# Patient Record
Sex: Male | Born: 1945
Health system: Southern US, Community
[De-identification: ages and names within clinical notes are randomized; demographics above are authoritative.]

## PROBLEM LIST (undated history)

## (undated) DIAGNOSIS — K219 Gastro-esophageal reflux disease without esophagitis: Secondary | ICD-10-CM

## (undated) DIAGNOSIS — I10 Essential (primary) hypertension: Secondary | ICD-10-CM

## (undated) DIAGNOSIS — J189 Pneumonia, unspecified organism: Secondary | ICD-10-CM

## (undated) DIAGNOSIS — L0390 Cellulitis, unspecified: Secondary | ICD-10-CM

## (undated) DIAGNOSIS — R011 Cardiac murmur, unspecified: Secondary | ICD-10-CM

## (undated) DIAGNOSIS — E039 Hypothyroidism, unspecified: Secondary | ICD-10-CM

## (undated) DIAGNOSIS — I509 Heart failure, unspecified: Secondary | ICD-10-CM

## (undated) DIAGNOSIS — I639 Cerebral infarction, unspecified: Secondary | ICD-10-CM

## (undated) DIAGNOSIS — Y92009 Unspecified place in unspecified non-institutional (private) residence as the place of occurrence of the external cause: Secondary | ICD-10-CM

## (undated) DIAGNOSIS — E7800 Pure hypercholesterolemia, unspecified: Secondary | ICD-10-CM

## (undated) DIAGNOSIS — G35 Multiple sclerosis: Secondary | ICD-10-CM

## (undated) DIAGNOSIS — W19XXXA Unspecified fall, initial encounter: Secondary | ICD-10-CM

## (undated) HISTORY — PX: APPENDECTOMY: SHX54

## (undated) HISTORY — PX: CARDIOVERSION: SHX1299

---

## 2013-11-12 ENCOUNTER — Emergency Department (HOSPITAL_BASED_OUTPATIENT_CLINIC_OR_DEPARTMENT_OTHER): Payer: Non-veteran care

## 2013-11-12 ENCOUNTER — Encounter (HOSPITAL_BASED_OUTPATIENT_CLINIC_OR_DEPARTMENT_OTHER): Payer: Self-pay | Admitting: Emergency Medicine

## 2013-11-12 ENCOUNTER — Emergency Department (HOSPITAL_BASED_OUTPATIENT_CLINIC_OR_DEPARTMENT_OTHER)
Admission: EM | Admit: 2013-11-12 | Discharge: 2013-11-12 | Disposition: A | Discharge status: Home / Self Care | Payer: Non-veteran care | Attending: Emergency Medicine | Admitting: Emergency Medicine

## 2013-11-12 DIAGNOSIS — R42 Dizziness and giddiness: Secondary | ICD-10-CM | POA: Insufficient documentation

## 2013-11-12 DIAGNOSIS — R011 Cardiac murmur, unspecified: Secondary | ICD-10-CM | POA: Insufficient documentation

## 2013-11-12 DIAGNOSIS — Y9389 Activity, other specified: Secondary | ICD-10-CM | POA: Insufficient documentation

## 2013-11-12 DIAGNOSIS — Z7982 Long term (current) use of aspirin: Secondary | ICD-10-CM | POA: Insufficient documentation

## 2013-11-12 DIAGNOSIS — Z043 Encounter for examination and observation following other accident: Secondary | ICD-10-CM | POA: Diagnosis not present

## 2013-11-12 DIAGNOSIS — W1809XA Striking against other object with subsequent fall, initial encounter: Secondary | ICD-10-CM | POA: Insufficient documentation

## 2013-11-12 DIAGNOSIS — E7800 Pure hypercholesterolemia, unspecified: Secondary | ICD-10-CM | POA: Diagnosis not present

## 2013-11-12 DIAGNOSIS — Y92009 Unspecified place in unspecified non-institutional (private) residence as the place of occurrence of the external cause: Secondary | ICD-10-CM | POA: Diagnosis not present

## 2013-11-12 DIAGNOSIS — I10 Essential (primary) hypertension: Secondary | ICD-10-CM | POA: Insufficient documentation

## 2013-11-12 DIAGNOSIS — Z79899 Other long term (current) drug therapy: Secondary | ICD-10-CM | POA: Insufficient documentation

## 2013-11-12 DIAGNOSIS — W19XXXA Unspecified fall, initial encounter: Secondary | ICD-10-CM

## 2013-11-12 DIAGNOSIS — K219 Gastro-esophageal reflux disease without esophagitis: Secondary | ICD-10-CM | POA: Diagnosis not present

## 2013-11-12 DIAGNOSIS — Z8669 Personal history of other diseases of the nervous system and sense organs: Secondary | ICD-10-CM | POA: Diagnosis not present

## 2013-11-12 DIAGNOSIS — E039 Hypothyroidism, unspecified: Secondary | ICD-10-CM | POA: Insufficient documentation

## 2013-11-12 HISTORY — DX: Multiple sclerosis: G35

## 2013-11-12 HISTORY — DX: Cardiac murmur, unspecified: R01.1

## 2013-11-12 HISTORY — DX: Pure hypercholesterolemia, unspecified: E78.00

## 2013-11-12 HISTORY — DX: Essential (primary) hypertension: I10

## 2013-11-12 HISTORY — DX: Hypothyroidism, unspecified: E03.9

## 2013-11-12 HISTORY — DX: Gastro-esophageal reflux disease without esophagitis: K21.9

## 2013-11-12 LAB — BASIC METABOLIC PANEL
Anion gap: 9 (ref 5–15)
BUN: 15 mg/dL (ref 6–23)
CHLORIDE: 106 meq/L (ref 96–112)
CO2: 29 meq/L (ref 19–32)
Calcium: 9.4 mg/dL (ref 8.4–10.5)
Creatinine, Ser: 1.3 mg/dL (ref 0.50–1.35)
GFR calc Af Amer: 64 mL/min — ABNORMAL LOW (ref >90)
GFR calc non Af Amer: 55 mL/min — ABNORMAL LOW (ref >90)
Glucose, Bld: 90 mg/dL (ref 70–99)
Potassium: 4.5 mEq/L (ref 3.7–5.3)
SODIUM: 144 meq/L (ref 137–147)

## 2013-11-12 LAB — CBC
HCT: 38.7 % — ABNORMAL LOW (ref 39.0–52.0)
HEMOGLOBIN: 13.1 g/dL (ref 13.0–17.0)
MCH: 31.1 pg (ref 26.0–34.0)
MCHC: 33.9 g/dL (ref 30.0–36.0)
MCV: 91.9 fL (ref 78.0–100.0)
Platelets: 142 10*3/uL — ABNORMAL LOW (ref 150–400)
RBC: 4.21 MIL/uL — AB (ref 4.22–5.81)
RDW: 15.3 % (ref 11.5–15.5)
WBC: 4.6 10*3/uL (ref 4.0–10.5)

## 2013-11-12 NOTE — ED Notes (Signed)
Pt returns to ed from ct scan!

## 2013-11-12 NOTE — Discharge Instructions (Signed)

## 2013-11-12 NOTE — ED Provider Notes (Signed)
CSN: [id number]     Arrival date & time 11/12/13  1719 History   First MD Initiated Contact with Patient 11/12/13 1720     Chief Complaint  Patient presents with  . Fall     (Consider location/radiation/quality/duration/timing/severity/associated sxs/prior Treatment) Patient is a 68 y.o. male presenting with fall. The history is provided by the patient.  Fall This is a new problem. The current episode started less than 1 hour ago. Episode frequency: once. The problem has been resolved. Pertinent negatives include no chest pain, no abdominal pain and no shortness of breath.    Past Medical History  Diagnosis Date  . Hypertension   . GERD (gastroesophageal reflux disease)   . Hypercholesteremia   . Thyroid activity decreased   . Multiple sclerosis   . Murmur, heart    Past Surgical History  Procedure Laterality Date  . Appendectomy     No family history on file. History  Substance Use Topics  . Smoking status: Never Smoker   . Smokeless tobacco: Not on file  . Alcohol Use: No    Review of Systems  Constitutional: Negative for fever and chills.  Respiratory: Negative for cough and shortness of breath.   Cardiovascular: Negative for chest pain.  Gastrointestinal: Negative for vomiting and abdominal pain.  All other systems reviewed and are negative.     Allergies  Review of patient's allergies indicates no known allergies.  Home Medications   Prior to Admission medications   Medication Sig Start Date End Date Taking? Authorizing Provider  amiodarone (PACERONE) 100 MG tablet Take 100 mg by mouth daily.   Yes Historical Provider, MD  aspirin 81 MG tablet Take 81 mg by mouth daily.   Yes Historical Provider, MD  atorvastatin (LIPITOR) 10 MG tablet Take 10 mg by mouth daily.   Yes Historical Provider, MD  hydrochlorothiazide (HYDRODIURIL) 50 MG tablet Take 50 mg by mouth daily.   Yes Historical Provider, MD  levothyroxine (SYNTHROID, LEVOTHROID) 100 MCG tablet Take  100 mcg by mouth daily before breakfast.   Yes Historical Provider, MD  metoprolol tartrate (LOPRESSOR) 25 MG tablet Take 25 mg by mouth 2 (two) times daily.   Yes Historical Provider, MD  pantoprazole (PROTONIX) 20 MG tablet Take 20 mg by mouth daily.   Yes Historical Provider, MD   BP 209/81  Pulse 53  Temp(Src) 98.8 F (37.1 C) (Oral)  Resp 18  Ht 6\' 1"  (1.854 m)  Wt 219 lb (99.338 kg)  BMI 28.90 kg/m2  SpO2 98% Physical Exam  Nursing note and vitals reviewed. Constitutional: He is oriented to person, place, and time. He appears well-developed and well-nourished. No distress.  HENT:  Head: Normocephalic and atraumatic.  Mouth/Throat: Oropharynx is clear and moist. No oropharyngeal exudate.  Eyes: EOM are normal. Pupils are equal, round, and reactive to light.  Neck: Normal range of motion. Neck supple. No tracheal deviation present. No thyromegaly present.  Cardiovascular: Normal rate and regular rhythm.  Exam reveals no friction rub.   No murmur heard. Pulmonary/Chest: Effort normal and breath sounds normal. No respiratory distress. He has no wheezes. He has no rales.  Abdominal: He exhibits no distension. There is no tenderness. There is no rebound.  Musculoskeletal: Normal range of motion. He exhibits no edema.  Neurological: He is alert and oriented to person, place, and time. No cranial nerve deficit. He exhibits normal muscle tone. Coordination normal.  Skin: No rash noted. He is not diaphoretic.    ED Course  Procedures (  including critical care time) Labs Review Labs Reviewed  CBC  BASIC METABOLIC PANEL    Imaging Review Ct Head Wo Contrast  11/12/2013   CLINICAL DATA:  Larey Seat.  Hit head.  EXAM: CT HEAD WITHOUT CONTRAST  TECHNIQUE: Contiguous axial images were obtained from the base of the skull through the vertex without intravenous contrast.  COMPARISON:  10/25/2004.  FINDINGS: Progressive age related cerebral atrophy, ventriculomegaly and periventricular white  matter disease. Remote appearing bilateral basal ganglia infarcts. No extra-axial fluid collections are identified. No CT findings for acute hemispheric infarction or intracranial hemorrhage. No mass lesions. The brainstem and cerebellum are normal.  The bony structures are intact. No acute skull fracture. The paranasal sinuses and mastoid air cells are clear. The globes are intact.  IMPRESSION: Cerebral atrophy, ventriculomegaly, periventricular white matter disease and remote lacunar-type basal ganglia infarcts.  No acute intracranial findings or skull fracture.   Electronically Signed   By: Loralie Champagne M.D.   On: 11/12/2013 18:17     EKG Interpretation None      MDM   Final diagnoses:  Fall, initial encounter    11M here after a fall. Was in his kitchen and fell backwards, denting the wall. No LOC. EMS called to assist with getting up. Patient refused ED transport, but EMS spoke with him and he agreed to come. Denies any preceding CP, SOB, dizziness. No recent bloody stools, cough, SOB, illness. Here with bradycardia, hypertension. No trauma noted. Normal neurologic exam. No extremity deformity. Will scan his head and check basic labs.  Scan labs are normal. Patient stable for discharge. He states he just lost balance and does this from time to time. No signs concerning for stroke. Has friends for transfer back home. Will follow up with his PCP soon.  Elwin Mocha, MD 11/12/13 [phone]

## 2013-11-12 NOTE — ED Notes (Signed)
Pt lives in independent living facility, fell in his kitchen. Hit his head on the wall, hypertensive (190/110), c/o lightheadedness.

## 2013-11-12 NOTE — ED Notes (Signed)
Nettie Elm, RN was unable to obtain labs with iv access. Pt is in ct scan, will attempt phlebotomy when pt returns to ed.

## 2014-04-02 DIAGNOSIS — J189 Pneumonia, unspecified organism: Secondary | ICD-10-CM

## 2014-04-02 HISTORY — DX: Pneumonia, unspecified organism: J18.9

## 2014-04-02 HISTORY — PX: ARTERIAL THROMBECTOMY: SHX558

## 2014-09-30 ENCOUNTER — Encounter (HOSPITAL_BASED_OUTPATIENT_CLINIC_OR_DEPARTMENT_OTHER): Payer: Self-pay | Admitting: *Deleted

## 2014-09-30 ENCOUNTER — Inpatient Hospital Stay (HOSPITAL_BASED_OUTPATIENT_CLINIC_OR_DEPARTMENT_OTHER)
Admission: EM | Admit: 2014-09-30 | Discharge: 2014-10-05 | DRG: 372 | Disposition: A | Discharge status: Rehabilitation Facility | Payer: Medicare Other | Attending: Internal Medicine | Admitting: Internal Medicine

## 2014-09-30 DIAGNOSIS — E7800 Pure hypercholesterolemia, unspecified: Secondary | ICD-10-CM

## 2014-09-30 DIAGNOSIS — S3289XA Fracture of other parts of pelvis, initial encounter for closed fracture: Secondary | ICD-10-CM | POA: Diagnosis present

## 2014-09-30 DIAGNOSIS — W19XXXA Unspecified fall, initial encounter: Secondary | ICD-10-CM | POA: Diagnosis not present

## 2014-09-30 DIAGNOSIS — S329XXA Fracture of unspecified parts of lumbosacral spine and pelvis, initial encounter for closed fracture: Secondary | ICD-10-CM

## 2014-09-30 DIAGNOSIS — A047 Enterocolitis due to Clostridium difficile: Principal | ICD-10-CM | POA: Diagnosis present

## 2014-09-30 DIAGNOSIS — D696 Thrombocytopenia, unspecified: Secondary | ICD-10-CM | POA: Diagnosis present

## 2014-09-30 DIAGNOSIS — E785 Hyperlipidemia, unspecified: Secondary | ICD-10-CM | POA: Diagnosis present

## 2014-09-30 DIAGNOSIS — I9589 Other hypotension: Secondary | ICD-10-CM | POA: Diagnosis not present

## 2014-09-30 DIAGNOSIS — N179 Acute kidney failure, unspecified: Secondary | ICD-10-CM | POA: Diagnosis present

## 2014-09-30 DIAGNOSIS — A0472 Enterocolitis due to Clostridium difficile, not specified as recurrent: Secondary | ICD-10-CM

## 2014-09-30 DIAGNOSIS — R5381 Other malaise: Secondary | ICD-10-CM | POA: Diagnosis not present

## 2014-09-30 DIAGNOSIS — N183 Chronic kidney disease, stage 3 (moderate): Secondary | ICD-10-CM | POA: Diagnosis present

## 2014-09-30 DIAGNOSIS — L8990 Pressure ulcer of unspecified site, unspecified stage: Secondary | ICD-10-CM | POA: Insufficient documentation

## 2014-09-30 DIAGNOSIS — S329XXS Fracture of unspecified parts of lumbosacral spine and pelvis, sequela: Secondary | ICD-10-CM | POA: Diagnosis not present

## 2014-09-30 DIAGNOSIS — I129 Hypertensive chronic kidney disease with stage 1 through stage 4 chronic kidney disease, or unspecified chronic kidney disease: Secondary | ICD-10-CM | POA: Diagnosis present

## 2014-09-30 DIAGNOSIS — G35 Multiple sclerosis: Secondary | ICD-10-CM

## 2014-09-30 DIAGNOSIS — Z8669 Personal history of other diseases of the nervous system and sense organs: Secondary | ICD-10-CM | POA: Diagnosis not present

## 2014-09-30 DIAGNOSIS — E039 Hypothyroidism, unspecified: Secondary | ICD-10-CM | POA: Diagnosis present

## 2014-09-30 DIAGNOSIS — R197 Diarrhea, unspecified: Secondary | ICD-10-CM | POA: Diagnosis not present

## 2014-09-30 DIAGNOSIS — D649 Anemia, unspecified: Secondary | ICD-10-CM | POA: Diagnosis present

## 2014-09-30 DIAGNOSIS — W010XXA Fall on same level from slipping, tripping and stumbling without subsequent striking against object, initial encounter: Secondary | ICD-10-CM | POA: Diagnosis present

## 2014-09-30 DIAGNOSIS — I482 Chronic atrial fibrillation: Secondary | ICD-10-CM | POA: Diagnosis present

## 2014-09-30 DIAGNOSIS — S32591S Other specified fracture of right pubis, sequela: Secondary | ICD-10-CM | POA: Diagnosis not present

## 2014-09-30 DIAGNOSIS — E038 Other specified hypothyroidism: Secondary | ICD-10-CM | POA: Diagnosis not present

## 2014-09-30 DIAGNOSIS — I959 Hypotension, unspecified: Secondary | ICD-10-CM | POA: Diagnosis present

## 2014-09-30 DIAGNOSIS — R531 Weakness: Secondary | ICD-10-CM | POA: Diagnosis not present

## 2014-09-30 DIAGNOSIS — L89622 Pressure ulcer of left heel, stage 2: Secondary | ICD-10-CM | POA: Diagnosis present

## 2014-09-30 DIAGNOSIS — E860 Dehydration: Secondary | ICD-10-CM | POA: Diagnosis not present

## 2014-09-30 DIAGNOSIS — K219 Gastro-esophageal reflux disease without esophagitis: Secondary | ICD-10-CM | POA: Diagnosis present

## 2014-09-30 DIAGNOSIS — Z7982 Long term (current) use of aspirin: Secondary | ICD-10-CM | POA: Diagnosis not present

## 2014-09-30 DIAGNOSIS — I872 Venous insufficiency (chronic) (peripheral): Secondary | ICD-10-CM | POA: Diagnosis present

## 2014-09-30 DIAGNOSIS — Z79899 Other long term (current) drug therapy: Secondary | ICD-10-CM | POA: Diagnosis not present

## 2014-09-30 DIAGNOSIS — G8250 Quadriplegia, unspecified: Secondary | ICD-10-CM | POA: Diagnosis not present

## 2014-09-30 LAB — URINALYSIS, ROUTINE W REFLEX MICROSCOPIC
Bilirubin Urine: NEGATIVE
Glucose, UA: NEGATIVE mg/dL
Hgb urine dipstick: NEGATIVE
KETONES UR: NEGATIVE mg/dL
LEUKOCYTES UA: NEGATIVE
NITRITE: NEGATIVE
PROTEIN: NEGATIVE mg/dL
Specific Gravity, Urine: 1.013 (ref 1.005–1.030)
Urobilinogen, UA: 0.2 mg/dL (ref 0.0–1.0)
pH: 5.5 (ref 5.0–8.0)

## 2014-09-30 LAB — COMPREHENSIVE METABOLIC PANEL
ALT: 17 U/L (ref 17–63)
AST: 26 U/L (ref 15–41)
Albumin: 2.9 g/dL — ABNORMAL LOW (ref 3.5–5.0)
Alkaline Phosphatase: 52 U/L (ref 38–126)
Anion gap: 8 (ref 5–15)
BILIRUBIN TOTAL: 1.7 mg/dL — AB (ref 0.3–1.2)
BUN: 33 mg/dL — AB (ref 6–20)
CHLORIDE: 105 mmol/L (ref 101–111)
CO2: 26 mmol/L (ref 22–32)
CREATININE: 1.58 mg/dL — AB (ref 0.61–1.24)
Calcium: 8 mg/dL — ABNORMAL LOW (ref 8.9–10.3)
GFR calc Af Amer: 50 mL/min — ABNORMAL LOW (ref >60)
GFR, EST NON AFRICAN AMERICAN: 43 mL/min — AB (ref >60)
GLUCOSE: 76 mg/dL (ref 65–99)
POTASSIUM: 3.6 mmol/L (ref 3.5–5.1)
Sodium: 139 mmol/L (ref 135–145)
Total Protein: 6 g/dL — ABNORMAL LOW (ref 6.5–8.1)

## 2014-09-30 LAB — I-STAT CG4 LACTIC ACID, ED: Lactic Acid, Venous: 1.99 mmol/L (ref 0.5–2.0)

## 2014-09-30 LAB — CBC WITH DIFFERENTIAL/PLATELET
BASOS ABS: 0.1 10*3/uL (ref 0.0–0.1)
Basophils Relative: 1 % (ref 0–1)
Eosinophils Absolute: 0.3 10*3/uL (ref 0.0–0.7)
Eosinophils Relative: 2 % (ref 0–5)
HCT: 38.9 % — ABNORMAL LOW (ref 39.0–52.0)
HEMOGLOBIN: 12.6 g/dL — AB (ref 13.0–17.0)
LYMPHS PCT: 7 % — AB (ref 12–46)
Lymphs Abs: 0.9 10*3/uL (ref 0.7–4.0)
MCH: 30.4 pg (ref 26.0–34.0)
MCHC: 32.4 g/dL (ref 30.0–36.0)
MCV: 93.7 fL (ref 78.0–100.0)
MONO ABS: 1.3 10*3/uL — AB (ref 0.1–1.0)
MONOS PCT: 10 % (ref 3–12)
NEUTROS PCT: 80 % — AB (ref 43–77)
Neutro Abs: 10.8 10*3/uL — ABNORMAL HIGH (ref 1.7–7.7)
PLATELETS: 213 10*3/uL (ref 150–400)
RBC: 4.15 MIL/uL — ABNORMAL LOW (ref 4.22–5.81)
RDW: 13.3 % (ref 11.5–15.5)
WBC: 13.3 10*3/uL — ABNORMAL HIGH (ref 4.0–10.5)

## 2014-09-30 MED ORDER — ONDANSETRON HCL 4 MG PO TABS: 4.0000 mg | ORAL_TABLET | Freq: Four times a day (QID) | ORAL | Status: DC | PRN

## 2014-09-30 MED ORDER — ACETAMINOPHEN 650 MG RE SUPP: 650.0000 mg | Freq: Four times a day (QID) | RECTAL | Status: DC | PRN

## 2014-09-30 MED ORDER — TRAMADOL HCL 50 MG PO TABS
50.0000 mg | ORAL_TABLET | Freq: Four times a day (QID) | ORAL | Status: DC | PRN
  Administered 2014-09-30 – 2014-10-04 (×7): 50 mg via ORAL
  Filled 2014-09-30 (×7): qty 1

## 2014-09-30 MED ORDER — LEVOTHYROXINE SODIUM 25 MCG PO TABS
125.0000 ug | ORAL_TABLET | Freq: Every day | ORAL | Status: DC
  Administered 2014-10-01 – 2014-10-05 (×5): 125 ug via ORAL
  Filled 2014-09-30 (×10): qty 1

## 2014-09-30 MED ORDER — SODIUM CHLORIDE 0.9 % IV BOLUS (SEPSIS): 1000.0000 mL | Freq: Once | INTRAVENOUS | Status: DC

## 2014-09-30 MED ORDER — SODIUM CHLORIDE 0.9 % IV SOLN
INTRAVENOUS | Status: AC
  Administered 2014-10-01 (×3): via INTRAVENOUS

## 2014-09-30 MED ORDER — ASPIRIN 81 MG PO CHEW
81.0000 mg | CHEWABLE_TABLET | Freq: Every day | ORAL | Status: DC
  Administered 2014-10-01 – 2014-10-05 (×5): 81 mg via ORAL
  Filled 2014-09-30 (×5): qty 1

## 2014-09-30 MED ORDER — ATORVASTATIN CALCIUM 80 MG PO TABS
80.0000 mg | ORAL_TABLET | Freq: Every day | ORAL | Status: DC
  Administered 2014-10-01 – 2014-10-05 (×5): 80 mg via ORAL
  Filled 2014-09-30 (×5): qty 1

## 2014-09-30 MED ORDER — DIMETHYL FUMARATE 240 MG PO CPDR
240.0000 mg | DELAYED_RELEASE_CAPSULE | Freq: Two times a day (BID) | ORAL | Status: DC
  Administered 2014-10-01 – 2014-10-05 (×6): 240 mg via ORAL
  Filled 2014-09-30 (×3): qty 1

## 2014-09-30 MED ORDER — ONDANSETRON HCL 4 MG/2ML IJ SOLN: 4.0000 mg | Freq: Four times a day (QID) | INTRAMUSCULAR | Status: DC | PRN

## 2014-09-30 MED ORDER — SODIUM CHLORIDE 0.9 % IV BOLUS (SEPSIS)
1000.0000 mL | Freq: Once | INTRAVENOUS | Status: AC
  Administered 2014-09-30: 1000 mL via INTRAVENOUS

## 2014-09-30 MED ORDER — SODIUM CHLORIDE 0.9 % IV SOLN
Freq: Once | INTRAVENOUS | Status: AC
  Administered 2014-09-30: 14:00:00 via INTRAVENOUS

## 2014-09-30 MED ORDER — ENSURE ENLIVE PO LIQD
237.0000 mL | Freq: Two times a day (BID) | ORAL | Status: DC
  Administered 2014-10-01 – 2014-10-05 (×10): 237 mL via ORAL

## 2014-09-30 MED ORDER — GABAPENTIN 300 MG PO CAPS
300.0000 mg | ORAL_CAPSULE | Freq: Three times a day (TID) | ORAL | Status: DC
  Administered 2014-09-30 – 2014-10-05 (×15): 300 mg via ORAL
  Filled 2014-09-30 (×15): qty 1

## 2014-09-30 MED ORDER — AMIODARONE HCL 200 MG PO TABS
200.0000 mg | ORAL_TABLET | Freq: Every day | ORAL | Status: DC
  Administered 2014-10-01 – 2014-10-05 (×5): 200 mg via ORAL
  Filled 2014-09-30 (×5): qty 1

## 2014-09-30 MED ORDER — METRONIDAZOLE IN NACL 5-0.79 MG/ML-% IV SOLN
500.0000 mg | Freq: Three times a day (TID) | INTRAVENOUS | Status: DC
  Administered 2014-10-01 (×2): 500 mg via INTRAVENOUS
  Filled 2014-09-30 (×3): qty 100

## 2014-09-30 MED ORDER — ENOXAPARIN SODIUM 40 MG/0.4ML ~~LOC~~ SOLN
40.0000 mg | SUBCUTANEOUS | Status: DC
  Administered 2014-10-01 – 2014-10-05 (×5): 40 mg via SUBCUTANEOUS
  Filled 2014-09-30 (×5): qty 0.4

## 2014-09-30 MED ORDER — IPRATROPIUM BROMIDE 0.06 % NA SOLN
2.0000 | Freq: Four times a day (QID) | NASAL | Status: DC
  Administered 2014-10-01 – 2014-10-05 (×13): 2 via NASAL
  Filled 2014-09-30: qty 15

## 2014-09-30 MED ORDER — ACETAMINOPHEN 325 MG PO TABS: 650.0000 mg | ORAL_TABLET | Freq: Four times a day (QID) | ORAL | Status: DC | PRN

## 2014-09-30 NOTE — ED Notes (Addendum)
[facility] called [facility] back-[facility] states they received the paperwork and will give to ER doctor but explained to tech that it could take a while due to being busy.  PA made aware.  Tech to call back in 

## 2014-09-30 NOTE — ED Notes (Signed)
Assisted patient to bedside commode.  Patient states he felt like he needed to have a bowel movement but was unable to go after being placed on bedside commode

## 2014-09-30 NOTE — ED Notes (Signed)
Delay in care-[facility] called and stated they did not have paperwork that was faxed over earlier today. Copy of original fax transmission "OK" @ 1811.  Requesting information to be refaxed.

## 2014-09-30 NOTE — ED Notes (Signed)
Patients sister is Conchita Paris # is [phone]

## 2014-09-30 NOTE — ED Notes (Addendum)
Old dried stool cleansed from pt's buttocks, scrotum and inner thighs. Scrotum noted red and excoriated- stage 2 ulcers to inner buttocks noted- dressings removed from BLE- 3+ edema noted with redness and scattered blisters and open wounds

## 2014-09-30 NOTE — H&P (Signed)
[facility] History and Physical  Daytin Maddy DGL:[id number] DOB: November 12, 1945 DOA: 09/30/2014  Referring physician: Mr.Hess.  PCP: [facility] Specialists: None.  Chief Complaint: Fall and low blood pressure.  HPI: Martin Powell is a 69 y.o. male with history of hypertension, hyperlipidemia, atrial fibrillation, multiple sclerosis with right lower extremity weakness and chronic lower extremity wounds who was recently in the hospital at [facility] for lower extremity wounds and was on clindamycin last month had a fall at his house and EMS was called. Patient states he was walking with help of the walker when suddenly tripped and fell last night and he had to crawl to the phone which became a lot of effort. When the EMS came he was found to be hypotensive and was given 500 mL normal saline bolus as patient's blood pressure was found in the 70s systolic. Patient was brought to the ER and was given further fluid boluses as patient's blood pressure was still in the low 90s. Patient states over the last 2 days patient has developed multiple episodes of diarrhea. Denies any nausea vomiting or abdominal pain. Patient states he also has pain in the right lower extremity around the hip and thigh area since his fall. Patient has weakness of the right lower extremity from his known history of multiple sclerosis. On exam patient has pain in his right lower extremity on trying to flex his thigh. Patient otherwise denies any chest pain or shortness of breath. Patient has bilateral lower extremity wound dressing done.   Review of Systems: As presented in the history of presenting illness, rest negative.  Past Medical History  Diagnosis Date  . Hypertension   . GERD (gastroesophageal reflux disease)   . Hypercholesteremia   . Thyroid activity decreased   . Multiple sclerosis   . Murmur, heart    Past Surgical History  Procedure Laterality Date  . Appendectomy     Social  History:  reports that he has never smoked. He does not have any smokeless tobacco history on file. He reports that he does not drink alcohol or use illicit drugs. Where does patient live home. Can patient participate in ADLs? Yes.  No Known Allergies  Family History:  Family History  Problem Relation Age of Onset  . CAD Neg Hx       Prior to Admission medications   Medication Sig Start Date End Date Taking? Authorizing Provider  amiodarone (PACERONE) 200 MG tablet Take 200 mg by mouth daily.   Yes Historical Provider, MD  aspirin 81 MG tablet Take 81 mg by mouth daily.   Yes Historical Provider, MD  atorvastatin (LIPITOR) 80 MG tablet Take 80 mg by mouth daily.   Yes Historical Provider, MD  Cholecalciferol (VITAMIN D3) 2000 UNITS TABS Take 1 tablet by mouth daily.   Yes Historical Provider, MD  Dimethyl Fumarate (TECFIDERA) 240 MG CPDR Take by mouth 2 (two) times daily.   Yes Historical Provider, MD  gabapentin (NEURONTIN) 300 MG capsule Take 300 mg by mouth 3 (three) times daily.   Yes Historical Provider, MD  ipratropium (ATROVENT) 0.06 % nasal spray Place 2 sprays into both nostrils 4 (four) times daily.   Yes Historical Provider, MD  levothyroxine (SYNTHROID, LEVOTHROID) 125 MCG tablet Take 125 mcg by mouth daily before breakfast.   Yes Historical Provider, MD  lisinopril-hydrochlorothiazide (PRINZIDE,ZESTORETIC) 20-12.5 MG per tablet Take 1 tablet by mouth daily.   Yes Historical Provider, MD  metoprolol (LOPRESSOR) 50 MG tablet  Take 50 mg by mouth 2 (two) times daily.   Yes Historical Provider, MD  amiodarone (PACERONE) 100 MG tablet Take 100 mg by mouth daily.    Historical Provider, MD  atorvastatin (LIPITOR) 10 MG tablet Take 10 mg by mouth daily.    Historical Provider, MD  hydrochlorothiazide (HYDRODIURIL) 50 MG tablet Take 50 mg by mouth daily.    Historical Provider, MD  levothyroxine (SYNTHROID, LEVOTHROID) 100 MCG tablet Take 100 mcg by mouth daily before breakfast.     Historical Provider, MD  metoprolol tartrate (LOPRESSOR) 25 MG tablet Take 25 mg by mouth 2 (two) times daily.    Historical Provider, MD  pantoprazole (PROTONIX) 20 MG tablet Take 20 mg by mouth daily.    Historical Provider, MD    Physical Exam: Filed Vitals:   09/30/14 2030 09/30/14 2045 09/30/14 2236 09/30/14 2244  BP: 105/65 118/78  113/76  Pulse: 90 105    Temp:    97.6 F (36.4 C)  TempSrc:    Oral  Resp: Height:    (1.905 m)   Weight:   109.498 kg (241 lb 6.4 oz)   SpO2: 99% 100%       General:  Moderately built and nourished.  Eyes: Anicteric no pallor.  ENT: No discharge from the ears eyes nose or mouth.  Neck: No mass felt.  Cardiovascular: S1-S2 heard.  Respiratory: No rhonchi or crepitations.  Abdomen: Soft nontender bowel sounds present. No guarding or rigidity.  Skin: Has mild skin denudation around the sacral area from repeated diarrhea as per the nurse. Has chronic lower extremity wounds which are dressed.  Musculoskeletal: Patient has pain on moving his right hip.  Psychiatric: Appears normal.  Neurologic: Alert awake oriented to time place and person. Has weakness of the right lower extremity which patient states is chronic from multiple sclerosis.  Labs on Admission:  Basic Metabolic Panel:  Recent Labs Lab 09/30/14 1320  NA 139  K 3.6  CL 105  CO2 26  GLUCOSE 76  BUN 33*  CREATININE 1.58*  CALCIUM 8.0*   Liver Function Tests:  Recent Labs Lab 09/30/14 1320  AST 26  ALT 17  ALKPHOS 52  BILITOT 1.7*  PROT 6.0*  ALBUMIN 2.9*   No results for input(s): LIPASE, AMYLASE in the last 168 hours. No results for input(s): AMMONIA in the last 168 hours. CBC:  Recent Labs Lab 09/30/14 1320  WBC 13.3*  NEUTROABS 10.8*  HGB 12.6*  HCT 38.9*  MCV 93.7  PLT 213   Cardiac Enzymes: No results for input(s): CKTOTAL, CKMB, CKMBINDEX, TROPONINI in the last 168 hours.  BNP (last 3 results) No results for input(s):  BNP in the last 8760 hours.  ProBNP (last 3 results) No results for input(s): PROBNP in the last 8760 hours.  CBG: No results for input(s): GLUCAP in the last 168 hours.  Radiological Exams on Admission: No results found.  EKG: Independently reviewed. Atrial fibrillation with rate around 104 bpm.  Assessment/Plan Principal Problem:   Dehydration Active Problems:   Hypotension   Diarrhea   Fall   Hypothyroidism   History of multiple sclerosis   Hyperlipidemia   1. Dehydration from diarrhea with hypotension - hold antihypertensives and continue with aggressive IV fluid hydration. Patient dehydration probably from the diarrhea for which we are ordering C. difficile and check for other pathogens. For now I have placed patient empirically on Flagyl. Patient denies any abdominal pain or blood in  the stools. Closely follow metabolic panel.  2. Fall with right hip pain - check x-ray of the pelvis for any fracture. Patient also has considerable pain in the right hip for which I have ordered CK levels also. Check CT head since patient states when he fell he hit his head but did not lose consciousness. Closely observe in telemetry. 3. Hypotension with history of hypertension - hold off antihypertensives and see #1. Patient does not look septic at this time. 4. History of atrial fibrillation - patient is not on any antivirals except for aspirin. Not sure why patient is not on any anticoagulation but patient is on amiodarone and metoprolol. Continue amiodarone for now but holding her metoprolol due to low blood pressure. Patient's chads 2 vasc score is 2 and patient will benefit from anticoagulation but we need to check patient's [facility] why patient was not started on anticoagulation. 5. History of multiple sclerosis with right lower extremity weakness - continue present medications. 6. Hypothyroidism on Synthroid. 7. Hyperlipidemia on statins - if CK levels are elevated may have to hold  statins. 8. Chronic kidney disease stage III - creatinine appears to be at baseline. 9. Lower extremity wounds and sacral wound - wound team consult requested. 10. Chronic anemia - follow CBC. 11. Protein calorie malnutrition - will need nutrition consult.   DVT Prophylaxis Lovenox.  Code Status: Full code.  Family Communication: Discussed with patient.  Disposition Plan: Admit to inpatient.    Martin Powell N. [facility] Pager [phone].  If 7PM-7AM, please contact night-coverage [email or web address] Password [email or web address] 09/30/2014, 11:21 PM

## 2014-09-30 NOTE — ED Provider Notes (Signed)
CSN: [id number]     Arrival date & time 09/30/14  1302 History   First MD Initiated Contact with Patient 09/30/14 1336     Chief Complaint  Patient presents with  . Hypotension  . Diarrhea     (Consider location/radiation/quality/duration/timing/severity/associated sxs/prior Treatment) HPI Comments: 69 year old male presenting be EMS with diarrhea 1 week. EMS was called to the home earlier this morning after patient lost his balance and fell, however was not transported at the time. No head injury or LOC. When home health was visiting today, they called EMS due to hypotension. On EMS arrival, blood pressure 76/48. He was given 500 mL bolus. One month ago, patient was admitted to the [facility] for infected wounds on his lower legs and was on clindamycin. Did not start having diarrhea until 1 week ago. Initially, diarrhea was stool colored and is now becoming watery. Denies abdominal pain, fever, chills, nausea or vomiting. Denies bloody stool. Denies chest pain or shortness of breath.  Patient is a 69 y.o. male presenting with diarrhea. The history is provided by the patient and the EMS personnel.  Diarrhea   Past Medical History  Diagnosis Date  . Hypertension   . GERD (gastroesophageal reflux disease)   . Hypercholesteremia   . Thyroid activity decreased   . Multiple sclerosis   . Murmur, heart    Past Surgical History  Procedure Laterality Date  . Appendectomy     No family history on file. History  Substance Use Topics  . Smoking status: Never Smoker   . Smokeless tobacco: Not on file  . Alcohol Use: No    Review of Systems  Gastrointestinal: Positive for diarrhea.  Skin: Positive for wound.  All other systems reviewed and are negative.     Allergies  Review of patient's allergies indicates no known allergies.  Home Medications   Prior to Admission medications   Medication Sig Start Date End Date Taking? Authorizing Provider  amiodarone (PACERONE) 200 MG  tablet Take 200 mg by mouth daily.   Yes Historical Provider, MD  aspirin 81 MG tablet Take 81 mg by mouth daily.   Yes Historical Provider, MD  atorvastatin (LIPITOR) 80 MG tablet Take 80 mg by mouth daily.   Yes Historical Provider, MD  Cholecalciferol (VITAMIN D3) 2000 UNITS TABS Take 1 tablet by mouth daily.   Yes Historical Provider, MD  Dimethyl Fumarate (TECFIDERA) 240 MG CPDR Take by mouth 2 (two) times daily.   Yes Historical Provider, MD  gabapentin (NEURONTIN) 300 MG capsule Take 300 mg by mouth 3 (three) times daily.   Yes Historical Provider, MD  ipratropium (ATROVENT) 0.06 % nasal spray Place 2 sprays into both nostrils 4 (four) times daily.   Yes Historical Provider, MD  levothyroxine (SYNTHROID, LEVOTHROID) 125 MCG tablet Take 125 mcg by mouth daily before breakfast.   Yes Historical Provider, MD  lisinopril-hydrochlorothiazide (PRINZIDE,ZESTORETIC) 20-12.5 MG per tablet Take 1 tablet by mouth daily.   Yes Historical Provider, MD  metoprolol (LOPRESSOR) 50 MG tablet Take 50 mg by mouth 2 (two) times daily.   Yes Historical Provider, MD  amiodarone (PACERONE) 100 MG tablet Take 100 mg by mouth daily.    Historical Provider, MD  atorvastatin (LIPITOR) 10 MG tablet Take 10 mg by mouth daily.    Historical Provider, MD  hydrochlorothiazide (HYDRODIURIL) 50 MG tablet Take 50 mg by mouth daily.    Historical Provider, MD  levothyroxine (SYNTHROID, LEVOTHROID) 100 MCG tablet Take 100 mcg by mouth daily before  breakfast.    Historical Provider, MD  metoprolol tartrate (LOPRESSOR) 25 MG tablet Take 25 mg by mouth 2 (two) times daily.    Historical Provider, MD  pantoprazole (PROTONIX) 20 MG tablet Take 20 mg by mouth daily.    Historical Provider, MD   BP 105/65 mmHg  Pulse 90  Temp(Src) 99.1 F (37.3 C) (Rectal)  Resp 18  Ht  (1.905 m)  Wt 170 lb (77.111 kg)  BMI 21.25 kg/m2  SpO2 99% Physical Exam  Constitutional: He is oriented to person, place, and time. He appears  well-developed and well-nourished. He appears ill. No distress.  HENT:  Head: Normocephalic and atraumatic.  Dry MM.  Eyes: Conjunctivae and EOM are normal.  Neck: Normal range of motion. Neck supple.  Cardiovascular: Regular rhythm and normal heart sounds.  Tachycardia present.   Pulses:      Dorsalis pedis pulses are 2+ on the right side, and 2+ on the left side.  Pulmonary/Chest: Effort normal and breath sounds normal.  Abdominal: He exhibits no distension. Bowel sounds are increased. There is no tenderness.  Musculoskeletal: Normal range of motion. He exhibits no edema.  Neurological: He is alert and oriented to person, place, and time.  Skin: Skin is warm and dry.  Bilateral lower extremities with +1 pitting edema, dry skin and erythema about midway up lower legs. Blistering to right lower extremity medial malleolus. Few chronic appearing stasis ulcers.  Psychiatric: He has a normal mood and affect. His behavior is normal.  Nursing note and vitals reviewed.   ED Course  Procedures (including critical care time) Labs Review Labs Reviewed  COMPREHENSIVE METABOLIC PANEL - Abnormal; Notable for the following:    BUN 33 (*)    Creatinine, Ser 1.58 (*)    Calcium 8.0 (*)    Total Protein 6.0 (*)    Albumin 2.9 (*)    Total Bilirubin 1.7 (*)    GFR calc non Af Amer 43 (*)    GFR calc Af Amer 50 (*)    All other components within normal limits  CBC WITH DIFFERENTIAL/PLATELET - Abnormal; Notable for the following:    WBC 13.3 (*)    RBC 4.15 (*)    Hemoglobin 12.6 (*)    HCT 38.9 (*)    Neutrophils Relative % 80 (*)    Neutro Abs 10.8 (*)    Lymphocytes Relative 7 (*)    Monocytes Absolute 1.3 (*)    All other components within normal limits  CULTURE, BLOOD (ROUTINE X 2)  CULTURE, BLOOD (ROUTINE X 2)  URINE CULTURE  CLOSTRIDIUM DIFFICILE BY PCR (NOT AT [facility])  URINALYSIS, ROUTINE W REFLEX MICROSCOPIC (NOT AT [facility])  I-STAT CG4 LACTIC ACID, ED  I-STAT CG4 LACTIC ACID, ED     Imaging Review No results found.   EKG Interpretation   Date/Time:  Thursday September 30 2014 13:16:47 EDT Ventricular Rate:  103 PR Interval:    QRS Duration: 160 QT Interval:  388 QTC Calculation: 508 R Axis:   -45 Text Interpretation:  Atrial fibrillation with rapid ventricular response  Right bundle branch block Left anterior fascicular block ** Bifascicular  block ** Lateral infarct , age undetermined Cannot rule out Inferior  infarct , age undetermined Abnormal ECG changed from prior EKG Confirmed  by BELFI  MD, MELANIE ([id number]) on 09/30/2014 1:57:33 PM      MDM   Final diagnoses:  Other specified hypotension  Diarrhea  Dehydration   Patient presenting with hypotension, diarrhea for  1 week. He appears ill and dry on arrival. Blood pressure 90/51. Afebrile rectally. Recently admitted to [facility] as stated above, on clindamycin. His wounds on his leg appear chronic. Hypotension most likely from the frequent diarrhea. Patient unable to give a stool sample here in the ED. Concern for C. Difficile. No associated abdominal pain. Abdomen is soft and nontender. Labs significant for leukocytosis of 13.3, slight elevation of creatinine from baseline. When patient arrived, there was initial thought of sepsis given hypotension and tachycardia and workup was initiated. Antibiotics held given concern for C. difficile, and no definite source of infection with patient being afebrile. After receiving IV fluids, blood pressure normalized and patient remains hemodynamically stable. Spoke with Dr. Rito Ehrlich, [facility] to be filled out for the physician to look at. After paperwork was filled out and faxed, a few hours later, [facility] called back stating [facility] have no available [facility] and the patient will need to be admitted elsewhere. I again called [facility] and spoke with Dr. Sharyon Medicus who accepts the patient for admission.  Stable for transfer. Tele bed, inpatient.  Discussed with attending Dr. Fredderick Phenix who also evaluated patient and agrees with plan of care.    Kathrynn Speed, PA-C 09/30/14 2045  Rolan Bucco, MD 10/01/14 [phone]

## 2014-09-30 NOTE — ED Notes (Addendum)
Pt from home- 1 week hx of diarrhea- pt hypotensive per EMS 76/48 upon arrival- pt has chronic wounds to BLE- received 500cc NS bolusby ems- 18g piv left ac placed pta

## 2014-09-30 NOTE — ED Notes (Signed)
Operator having trouble with the lines, having someone to check them, will call back in about two minutes.

## 2014-10-01 ENCOUNTER — Inpatient Hospital Stay (HOSPITAL_COMMUNITY): Payer: Medicare Other

## 2014-10-01 DIAGNOSIS — E7800 Pure hypercholesterolemia, unspecified: Secondary | ICD-10-CM

## 2014-10-01 DIAGNOSIS — G35 Multiple sclerosis: Secondary | ICD-10-CM | POA: Diagnosis present

## 2014-10-01 LAB — CK: Total CK: 464 U/L — ABNORMAL HIGH (ref 49–397)

## 2014-10-01 LAB — BASIC METABOLIC PANEL
ANION GAP: 10 (ref 5–15)
BUN: 23 mg/dL — ABNORMAL HIGH (ref 6–20)
CO2: 25 mmol/L (ref 22–32)
Calcium: 8.1 mg/dL — ABNORMAL LOW (ref 8.9–10.3)
Chloride: 104 mmol/L (ref 101–111)
Creatinine, Ser: 1.41 mg/dL — ABNORMAL HIGH (ref 0.61–1.24)
GFR calc Af Amer: 57 mL/min — ABNORMAL LOW (ref >60)
GFR calc non Af Amer: 49 mL/min — ABNORMAL LOW (ref >60)
Glucose, Bld: 81 mg/dL (ref 65–99)
Potassium: 3.1 mmol/L — ABNORMAL LOW (ref 3.5–5.1)
Sodium: 139 mmol/L (ref 135–145)

## 2014-10-01 LAB — CBC WITH DIFFERENTIAL/PLATELET
BASOS ABS: 0.1 10*3/uL (ref 0.0–0.1)
Basophils Relative: 1 % (ref 0–1)
Eosinophils Absolute: 0.7 10*3/uL (ref 0.0–0.7)
Eosinophils Relative: 6 % — ABNORMAL HIGH (ref 0–5)
HCT: 38.7 % — ABNORMAL LOW (ref 39.0–52.0)
Hemoglobin: 12.9 g/dL — ABNORMAL LOW (ref 13.0–17.0)
LYMPHS ABS: 1.1 10*3/uL (ref 0.7–4.0)
Lymphocytes Relative: 10 % — ABNORMAL LOW (ref 12–46)
MCH: 30.2 pg (ref 26.0–34.0)
MCHC: 33.3 g/dL (ref 30.0–36.0)
MCV: 90.6 fL (ref 78.0–100.0)
MONO ABS: 1.1 10*3/uL — AB (ref 0.1–1.0)
Monocytes Relative: 10 % (ref 3–12)
NEUTROS ABS: 8 10*3/uL — AB (ref 1.7–7.7)
NEUTROS PCT: 73 % (ref 43–77)
PLATELETS: 201 10*3/uL (ref 150–400)
RBC: 4.27 MIL/uL (ref 4.22–5.81)
RDW: 13.2 % (ref 11.5–15.5)
WBC: 10.9 10*3/uL — AB (ref 4.0–10.5)

## 2014-10-01 LAB — URINE CULTURE

## 2014-10-01 LAB — CLOSTRIDIUM DIFFICILE BY PCR: Toxigenic C. Difficile by PCR: POSITIVE — AB

## 2014-10-01 LAB — LACTIC ACID, PLASMA: Lactic Acid, Venous: 1.1 mmol/L (ref 0.5–2.0)

## 2014-10-01 MED ORDER — POTASSIUM CHLORIDE CRYS ER 20 MEQ PO TBCR
20.0000 meq | EXTENDED_RELEASE_TABLET | Freq: Once | ORAL | Status: AC
  Administered 2014-10-01: 20 meq via ORAL
  Filled 2014-10-01: qty 1

## 2014-10-01 MED ORDER — VANCOMYCIN 50 MG/ML ORAL SOLUTION
125.0000 mg | Freq: Four times a day (QID) | ORAL | Status: DC
  Administered 2014-10-01 – 2014-10-05 (×19): 125 mg via ORAL
  Filled 2014-10-01 (×20): qty 2.5

## 2014-10-01 MED ORDER — POTASSIUM CHLORIDE CRYS ER 20 MEQ PO TBCR
40.0000 meq | EXTENDED_RELEASE_TABLET | Freq: Two times a day (BID) | ORAL | Status: DC
  Administered 2014-10-01 – 2014-10-05 (×9): 40 meq via ORAL
  Filled 2014-10-01 (×9): qty 2

## 2014-10-01 MED ORDER — ADULT MULTIVITAMIN W/MINERALS CH
1.0000 | ORAL_TABLET | Freq: Every day | ORAL | Status: DC
  Administered 2014-10-01 – 2014-10-05 (×5): 1 via ORAL
  Filled 2014-10-01 (×5): qty 1

## 2014-10-01 NOTE — Progress Notes (Signed)
Orthopedic Tech Progress Note Patient Details:  Martin Powell 1945-06-08 [id number]  Ortho Devices Type of Ortho Device: Roland Rack boot Ortho Device/Splint Location: bilateral Ortho Device/Splint Interventions: Application   Madiha Bambrick 10/01/2014, 10:22 AM

## 2014-10-01 NOTE — Consult Note (Addendum)
WOC wound consult note Reason for Consult: Venous insufficiency.  Wears Unnas boots.  HH assists at home.  Due to increasing edema, has developed serum filled blisters to Left dorsal foot at the base of the toes and right medial malleolus.  Left lateral malleolus with a nonintact lesion as well.  May have been a ruptured serum filled blister.    Wound type:Venous stasis disease.  Moisture Associated SKin Damage (MASD) present to bilateral gluteal folds from frequent diarrhea.  This is resolving with barrier cream.  Pressure Ulcer POA: n/a Measurement: Left dorsal foot 2 cm x 8 cm intact serum filled blistering Right medial malleolus 2 cm x 1.5 cm serum filled blister Left lateral malleolus 1 cm x 1 cm x 0.1 cm Wound bed 100% pink and moist.  Drainage (amount, consistency, odor) None Periwound:Generalized nonpitting edema from just below knees to feet.  Dressing procedure/placement/frequency:Cleanse bilateral legs with soap and water.  Apply nonadherent dressing to blistering to left dorsal foot, right medial malleolus and left lateral malleolus.  Apply Unnas boots and secure with self adherent dressing.  Change twice weekly.  Ortho tech paged and informed of procedure for application.  Will not follow at this time.  Please re-consult if needed.  Maple Hudson RN BSN CWON Pager [phone]

## 2014-10-01 NOTE — Progress Notes (Signed)
Initial Nutrition Assessment  DOCUMENTATION CODES:  Obesity unspecified  INTERVENTION:  -Ensure Enlive po BID, each supplement provides 350 kcal and 20 grams of protein -MVI daily  NUTRITION DIAGNOSIS:  Increased nutrient needs related to wound healing as evidenced by estimated needs.   GOAL:  Patient will meet greater than or equal to 90% of their needs   MONITOR:  PO intake, Supplement acceptance, Labs, Weight trends, Skin, I & O's  REASON FOR ASSESSMENT:  Malnutrition Screening Tool    ASSESSMENT: Martin Powell is a 69 y.o. male with history of hypertension, hyperlipidemia, atrial fibrillation, multiple sclerosis with right lower extremity weakness and chronic lower extremity wounds who was recently in the hospital at [facility] for lower extremity wounds and was on clindamycin last month had a fall at his house and EMS was called.  Pt admitted for fall and low blood pressure.   Pt was being bathed at time of visit; unable to perform nutrition-focused physical exam at this time.   Wt hx reveals wt gain over the past year.   Reviewed COWRN note from 10/01/14; pt with stage II pressure ulcer on lt heel from doc flowsheets as well as MASD on buttocks. Pt with increased nutritional needs for wound healing.   He is currently on a Heart Healthy diet and consuming Ensure supplements.   Labs reviewed. K: 3.1 (on supplement).   Height:  Ht Readings from Last 1 Encounters:  09/30/14  (1.905 m)    Weight:  Wt Readings from Last 1 Encounters:  09/30/14 241 lb 6.4 oz (109.498 kg)    Ideal Body Weight:  89 kg  Wt Readings from Last 10 Encounters:  09/30/14 241 lb 6.4 oz (109.498 kg)  11/12/13 219 lb (99.338 kg)    BMI:  Body mass index is 30.17 kg/(m^2).  Estimated Nutritional Needs:  Kcal:  2300-2500  Protein:  115-130 grams  Fluid:  2.3-2.5 L  Skin:  Wound (see comment) (st II left heel)  Diet Order:  Diet Heart Room service appropriate?:  Yes; Fluid consistency:: Thin  EDUCATION NEEDS:  No education needs identified at this time   Intake/Output Summary (Last 24 hours) at 10/01/14 1528 Last data filed at 09/30/14 2350  Gross per 24 hour  Intake    240 ml  Output   1250 ml  Net  -1010 ml    Last BM:  10/01/14  Martin Powell A. Mayford Knife, RD, LDN, CDE Pager: [phone] After hours Pager: [phone]

## 2014-10-01 NOTE — Evaluation (Signed)
Physical Therapy Evaluation Patient Details Name: Martin Powell MRN: [id number] DOB: 1945/06/22 Today's Date: 10/01/2014   History of Present Illness  Patient is a 69 yo male admitted 09/30/14 following a fall.  Patient with dehydration, hypotension, and diarrhea.  Patient with nondisplaced pubic fx (WBAT).  PMH:  Chronic bil LE wounds - Unna boots, MS, HTN, Afib, RLE weakness  Clinical Impression  Patient presents with problems listed below.  Will benefit from acute PT to maximize functional mobility prior to discharge.  Patient was at Mod I level for mobility - will need to return to this level to be able to go home with only prn assist.  Recommend HHPT f/u.  If patient unable to reach this functional level, may need to consider SNF.    Follow Up Recommendations Home health PT;Supervision for mobility/OOB    Equipment Recommendations  None recommended by PT    Recommendations for Other Services       Precautions / Restrictions Precautions Precautions: Fall Restrictions Weight Bearing Restrictions: Yes RLE Weight Bearing: Weight bearing as tolerated      Mobility  Bed Mobility Overal bed mobility: Needs Assistance Bed Mobility: Rolling;Sidelying to Sit;Sit to Supine Rolling: Min guard Sidelying to sit: Min assist   Sit to supine: Min guard   General bed mobility comments: Verbal cues for technique.  Assist to raise trunk to sitting position.  Once upright, patient with good balance.  Patient able to return to supine with min guard assist for safety.  Requires increased time for all mobility.  Transfers Overall transfer level: Needs assistance Equipment used: Rolling walker (2 wheeled) Transfers: Sit to/from Stand Sit to Stand: Min assist         General transfer comment: Verbal cues for hand placement and technique.  Assist to power up to standing and for balance.  Ambulation/Gait Ambulation/Gait assistance: Min guard Ambulation Distance (Feet): 10 Feet Assistive  device: Rolling walker (2 wheeled) Gait Pattern/deviations: Step-through pattern;Decreased stride length;Shuffle;Trunk flexed Gait velocity: Decreased Gait velocity interpretation: Below normal speed for age/gender General Gait Details: Verbal cues for safe use of RW.  Assist for safety only.  Stairs            Wheelchair Mobility    Modified Rankin (Stroke Patients Only)       Balance                                             Pertinent Vitals/Pain Pain Assessment: Faces Faces Pain Scale: Hurts whole lot Pain Location: BLE's (wounds) and buttocks (from diarrhea) Pain Descriptors / Indicators: Sore;Tender Pain Intervention(s): Monitored during session;Repositioned;Patient requesting pain meds-RN notified;RN gave pain meds during session    Home Living Family/patient expects to be discharged to:: Private residence Living Arrangements: Alone Available Help at Discharge: Family;Available PRN/intermittently Type of Home: Apartment       Home Layout: One level Home Equipment: Walker - 2 wheels;Shower seat;Grab bars - tub/shower      Prior Function Level of Independence: Independent with assistive device(s);Needs assistance   Gait / Transfers Assistance Needed: Uses RW for gait  ADL's / Homemaking Assistance Needed: Sister assists with housekeeping and errands (patient does not drive).  Patient reports he has someone in the house when he showers.        Hand Dominance        Extremity/Trunk Assessment   Upper Extremity Assessment: Generalized  weakness           Lower Extremity Assessment: Generalized weakness         Communication   Communication: No difficulties  Cognition Arousal/Alertness: Awake/alert Behavior During Therapy: WFL for tasks assessed/performed Overall Cognitive Status: Within Functional Limits for tasks assessed                      General Comments      Exercises        Assessment/Plan     PT Assessment Patient needs continued PT services  PT Diagnosis Difficulty walking;Generalized weakness;Acute pain   PT Problem List Decreased strength;Decreased activity tolerance;Decreased balance;Decreased mobility;Pain;Decreased skin integrity  PT Treatment Interventions DME instruction;Gait training;Functional mobility training;Therapeutic activities;Patient/family education   PT Goals (Current goals can be found in the Care Plan section) Acute Rehab PT Goals Patient Stated Goal: To go home PT Goal Formulation: With patient Time For Goal Achievement: 10/08/14 Potential to Achieve Goals: Good    Frequency Min 3X/week   Barriers to discharge Decreased caregiver support Patient lives alone.  Has prn assist from daughter    Co-evaluation               End of Session Equipment Utilized During Treatment: Gait belt Activity Tolerance: Patient limited by fatigue Patient left: in bed;with call bell/phone within reach;with nursing/sitter in room Nurse Communication: Mobility status;Patient requests pain meds         Time: 1411-1439 PT Time Calculation (min) (ACUTE ONLY): 28 min   Charges:   PT Evaluation $Initial PT Evaluation Tier I: 1 Procedure PT Treatments $Gait Training: 8-22 mins   PT G Codes:        Vena Austria 10-29-14, 3:29 PM Durenda Hurt. Renaldo Fiddler, [facility] Acute Rehab Services Pager [phone]

## 2014-10-01 NOTE — Care Management (Signed)
Utilization review completed. Kelsea Mousel, RN Case Manager [phone]. 

## 2014-10-01 NOTE — Care Management Note (Addendum)
Case Management Note  Patient Details  Name: Martin Powell MRN: [id number] Date of Birth: 06/09/1945  Subjective/Objective:  Pt admitted for Fall and low bp and positive for c-diff.                  Action/Plan: Physician did ask CM to transfer to [facility]. CM did call [facility] and spoke with Vernona Rieger. No beds available at this time. CM was told to call back in am for bed availability. AOD # [id number] ext [phone]. Charge Nurse has number as well as CM for weekends. Information to be faxed is in the shadow chart. Please as weekend CM for assistance if bed is available. No further needs at this time.    Expected Discharge Date:                  Expected Discharge Plan:  Acute to Acute Transfer [facility])  In-House Referral:     Discharge planning Services  CM Consult  Post Acute Care Choice:    Choice offered to:     DME Arranged:    DME Agency:     HH Arranged:    HH Agency:     Status of Service:  Completed, signed off  Medicare Important Message Given:  Yes-second notification given Date Medicare IM Given:    Medicare IM give by:    Date Additional Medicare IM Given:    Additional Medicare Important Message give by:     If discussed at Long Length of Stay Meetings, dates discussed:    Additional Comments:  1143 10-05-14 Tomi Bamberger, RN,BSN [phone] CM did speak with CIR and the plan is for d/c today to inpatient rehab. RN aware. No further needs from CM at this time.   Gala Lewandowsky, RN 10/01/2014, 3:40 PM

## 2014-10-01 NOTE — Progress Notes (Signed)
NP Craige Cotta notified of pt potassium level 3.1. Will continue to monitor closely and await further orders.

## 2014-10-01 NOTE — Progress Notes (Signed)
Jathen Sudano XLK:[id number] DOB: [birth year] DOA: 09/30/2014 PCP: No PCP Per Patient  Brief narrative:  69 y/o ? h/o Htn, Multiple sclerosis, Afib CHad2Vasc2 score~2-3 not on Sys AC, chr Hypotension in a h/o hypertension, chr Venous insufficiency, hypothryoidism, HLd, CLD stg Estimated Creatinine Clearance: 66.1 mL/min (by C-G formula based on Cr of 1.41). II, Severe P-EM. Admit c 1 wk h/o diarrhea in a setting of recent use clindamycin for LE's-found to be hypotensive and s/p fall -found to be CDIFF +  Past medical history-As per Problem list Chart reviewed as below-   Consultants:  none  Procedures:  stable  Antibiotics:  flagyll IV-->stopped  PO vancomycin   Subjective   alert pleasant oriented in nad No n/v/cp usually ambulatory at [facility]    Objective    Interim History:   Telemetry: sinus   Objective: Filed Vitals:   09/30/14 2236 09/30/14 2244 10/01/14 0400 10/01/14 0805  BP:  113/76 79/57   Pulse:   102   Temp:  97.6 F (36.4 C) 98.1 F (36.7 C) 99.4 F (37.4 C)  TempSrc:  Oral Oral Oral  Resp:  11    Height:  (1.905 m)     Weight: 109.498 kg (241 lb 6.4 oz)     SpO2:        Intake/Output Summary (Last 24 hours) at 10/01/14 1342 Last data filed at 09/30/14 2350  Gross per 24 hour  Intake   1740 ml  Output   1250 ml  Net    490 ml   WOC nurse  Moisture Associated SKin Damage (MASD) present to bilateral gluteal folds from frequent diarrhea. This is resolving with barrier cream.  Pressure Ulcer POA: n/a Measurement: Left dorsal foot 2 cm x 8 cm intact serum filled blistering Right medial malleolus 2 cm x 1.5 cm serum filled blister Left lateral malleolus 1 cm x 1 cm x 0.1 cm Wound bed 100% pink and moist.  Drainage (amount, consistency, odor) None Periwound:Generalized nonpitting edema from just below knees to feet.  Dressing procedure/placement/frequency:Cleanse bilateral legs with soap and water. Apply nonadherent dressing to  blistering to left dorsal foot, right medial malleolus and left lateral malleolus. Apply Unnas boots and secure with self adherent dressing. Change twice weekly.  Ortho tech paged and informed of procedure for application.   Exam:  General: eomi, ncat Cardiovascular: s1 s2 no m/r/g Respiratory: cta b Abdomen: soft nt nd Skin wrapped Neuro intact  Data Reviewed: Basic Metabolic Panel:  Recent Labs Lab 09/30/14 1320 10/01/14 0117  NA 139 139  K 3.6 3.1*  CL 105 104  CO2 26 25  GLUCOSE 76 81  BUN 33* 23*  CREATININE 1.58* 1.41*  CALCIUM 8.0* 8.1*   Liver Function Tests:  Recent Labs Lab 09/30/14 1320  AST 26  ALT 17  ALKPHOS 52  BILITOT 1.7*  PROT 6.0*  ALBUMIN 2.9*   No results for input(s): LIPASE, AMYLASE in the last 168 hours. No results for input(s): AMMONIA in the last 168 hours. CBC:  Recent Labs Lab 09/30/14 1320 10/01/14 0117  WBC 13.3* 10.9*  NEUTROABS 10.8* 8.0*  HGB 12.6* 12.9*  HCT 38.9* 38.7*  MCV 93.7 90.6  PLT 213 201   Cardiac Enzymes:  Recent Labs Lab 10/01/14 0117  CKTOTAL 464*   BNP: Invalid input(s): POCBNP CBG: No results for input(s): GLUCAP in the last 168 hours.  Recent Results (from the past 240 hour(s))  Urine culture     Status: None  Collection Time: 09/30/14  3:05 PM  Result Value Ref Range Status   Specimen Description URINE, CLEAN CATCH  Final   Special Requests NONE  Final   Culture   Final    MULTIPLE SPECIES PRESENT, SUGGEST RECOLLECTION IF CLINICALLY INDICATED Performed at [facility]    Report Status 10/01/2014 FINAL  Final  Clostridium Difficile by PCR (not at [facility])     Status: Abnormal   Collection Time: 10/01/14  2:43 AM  Result Value Ref Range Status   C difficile by pcr POSITIVE (A) NEGATIVE Final    Comment: CRITICAL RESULT CALLED TO, READ BACK BY AND VERIFIED WITH:  Clarene Critchley AT [id number] BY KPEELE      Studies:              All Imaging reviewed and is as per above notation    Scheduled Meds: . amiodarone  200 mg Oral Daily  . aspirin  81 mg Oral Daily  . atorvastatin  80 mg Oral Daily  . Dimethyl Fumarate  240 mg Oral BID  . enoxaparin (LOVENOX) injection  40 mg Subcutaneous Q24H  . feeding supplement (ENSURE ENLIVE)  237 mL Oral BID BM  . gabapentin  300 mg Oral TID  . ipratropium  2 spray Each Nare QID  . levothyroxine  125 mcg Oral QAC breakfast  . potassium chloride  40 mEq Oral BID  . vancomycin  125 mg Oral QID   Continuous Infusions: . sodium chloride 125 mL/hr at 10/01/14 1333     Assessment/Plan:  1. CDIFF colitis-d/c flagyll-PO Vancomycin 125 mg qid.  CBC am.  D/c any PPI. 2. R hip pain-pelvic fracture-non-operative-d/c Dr. Luane School f/u as OP with ortho 3. Hypotension with h/o htn-hold any further ant-htn meds 4. Afib rate controlled-not on NOAC CHad2Vasc2 score=2-3-Continue Amiodarone 200 daily.  Would continue ASA 81 daily-would consider OP discussion re: coumadin 5. H/o Multiple sclerosis-Not on meds- ~ 8-10 yr ago-not on any meds. 6. hypothyroid-consider TSH 1 mo as on chr Amiodarone 7. hld -continue statin 8. Chr Anemia-stable-no further in-patient w/u.  Likely dilutional component 9. Severe P-EM-not sever 10. Estimated Creatinine Clearance: 66.1 mL/min (by C-G formula based on Cr of 1.41). CKD II  Code Status: FUll Family Communication:  none Disposition Plan: Inpatient--Could consider transfer to [facility]   Pleas Koch, MD  [facility] Pager [phone] 10/01/2014, 1:42 PM    LOS: 1 day

## 2014-10-02 LAB — COMPREHENSIVE METABOLIC PANEL
ALT: 15 U/L — ABNORMAL LOW (ref 17–63)
AST: 22 U/L (ref 15–41)
Albumin: 2.6 g/dL — ABNORMAL LOW (ref 3.5–5.0)
Alkaline Phosphatase: 50 U/L (ref 38–126)
Anion gap: 8 (ref 5–15)
BILIRUBIN TOTAL: 1.1 mg/dL (ref 0.3–1.2)
BUN: 17 mg/dL (ref 6–20)
CALCIUM: 8.1 mg/dL — AB (ref 8.9–10.3)
CHLORIDE: 107 mmol/L (ref 101–111)
CO2: 25 mmol/L (ref 22–32)
Creatinine, Ser: 1.06 mg/dL (ref 0.61–1.24)
GFR calc Af Amer: >60 mL/min (ref >60)
Glucose, Bld: 83 mg/dL (ref 65–99)
Potassium: 4.1 mmol/L (ref 3.5–5.1)
Sodium: 140 mmol/L (ref 135–145)
TOTAL PROTEIN: 5.4 g/dL — AB (ref 6.5–8.1)

## 2014-10-02 LAB — CBC WITH DIFFERENTIAL/PLATELET
BASOS ABS: 0.1 10*3/uL (ref 0.0–0.1)
Basophils Relative: 1 % (ref 0–1)
EOS PCT: 9 % — AB (ref 0–5)
Eosinophils Absolute: 0.7 10*3/uL (ref 0.0–0.7)
HEMATOCRIT: 36.3 % — AB (ref 39.0–52.0)
Hemoglobin: 11.8 g/dL — ABNORMAL LOW (ref 13.0–17.0)
LYMPHS ABS: 0.8 10*3/uL (ref 0.7–4.0)
Lymphocytes Relative: 10 % — ABNORMAL LOW (ref 12–46)
MCH: 29.9 pg (ref 26.0–34.0)
MCHC: 32.5 g/dL (ref 30.0–36.0)
MCV: 91.9 fL (ref 78.0–100.0)
MONOS PCT: 14 % — AB (ref 3–12)
Monocytes Absolute: 1.1 10*3/uL — ABNORMAL HIGH (ref 0.1–1.0)
NEUTROS ABS: 5 10*3/uL (ref 1.7–7.7)
NEUTROS PCT: 66 % (ref 43–77)
PLATELETS: 125 10*3/uL — AB (ref 150–400)
RBC: 3.95 MIL/uL — ABNORMAL LOW (ref 4.22–5.81)
RDW: 13 % (ref 11.5–15.5)
WBC: 7.6 10*3/uL (ref 4.0–10.5)

## 2014-10-02 LAB — PROTIME-INR
INR: 1.43 (ref 0.00–1.49)
PROTHROMBIN TIME: 17.5 s — AB (ref 11.6–15.2)

## 2014-10-02 MED ORDER — VANCOMYCIN 50 MG/ML ORAL SOLUTION: 125.0000 mg | Freq: Four times a day (QID) | ORAL | Status: DC

## 2014-10-02 NOTE — Progress Notes (Addendum)
Physician Discharge Summary  Martin Powell ZOX:[id number] DOB: 05-25-1945 DOA: 09/30/2014  [name] Ad[facility] date: 09/30/2014 Discharge date: 10/03/2014  Time spent: 55 minutes  Recommendations for Outpatient Follow-up:  1. Patient d/c to [facility] for further IP care 2. NO PPI due to C-diff diarrhea 3. Please consider cbc + Chem 12  In 2-3 days       Duration of PO Vanc ~ 10 days ending on 10/12/14       Wound care to lower extremities.  Unna Boots twice a week-  Continue Oral Vancomycin thru 7/12 for C-diff diarrhea Follow platelet count   Discharge Diagnoses:  Principal Problem:   Enteritis due to Clostridium difficile Active Problems:   Hypotension   Dehydration   Hypothyroidism   Hyperlipidemia   Multiple sclerosis   Hypercholesteremia   Pressure ulcer   Pelvic fracture  acute thrombocytopenia  Antibiotics:  flagyl IV--> given 7/1  PO vancomycin- started 7/2  Discharge Condition: stable  Diet recommendation: hh low salt  Filed Weights   09/30/14 1854 09/30/14 2236 10/02/14 0358  Weight: 77.111 kg (170 lb) 109.498 kg (241 lb 6.4 oz) 109.256 kg (240 lb 13.9 oz)    History of present illness:  69 y/o ? h/o Htn, Multiple sclerosis on Tecfidera, Afib CHad2Vasc2 score~2-3 not on Sys AC, chr Hypotension c  h/o hypertension, chr Venous insufficiency, hypothryoidism, HLd, CLD stg Estimated Creatinine Clearance: 87.8 mL/min (by C-G formula based on Cr of 1.06). II, Severe P-EM. Admit c 1 wk h/o diarrhea in a setting of recent use clindamycin for LE's-found to be hypotensive and s/p fall -found to be CDIFF +. Also found to have a pelvic fracture after a fall prior to admission.    Hospital Course:   C-diff diarrhea Continue Vancomycin PO until 7/12. D/C PPI. Patient with tenesmus and flatulence overnight and one large bm this morning. No further watery stools. Physical therapy has seen the patient and recommends home health PT with out of bed supervision  for mobility. Please ambulate patient in the hallway with his walker multiple times per day.  Thrombocytopenia Drop in platelets from 213 on admission to 125 on 7/3. Possibly due to acute infection vs Lovenox -Will check CBC on 7/4 if he still remains at [facility] - cont Lovenox for today  Right hip pain with right pelvic fracture Secondary to fall prior to admission. Nonoperative nondisplaced pelvic fracture seen on x-ray 7/1 Follow-up outpatient with Dr. Roda Shutters of orthopedic surgery.  Hypotension in the setting of a history of hypertension -Hypotension likely due to diarrhea Will restart Lopressor 7/3 as his pulse rate is climbing and his blood pressure stable. Will continue to hold HCTZ, and lisinopril.  Atrial fibrillation On amiodarone. Lopressor resumed today 7/3. Daily 81 mg aspirin. ChadsVasc score is at least 2. He is not currently on anticoagulation. He has history of heart murmur. Uncertain if he has valvular disease- we don't have an ECHO in our system. Will defer to his outpatient primary care physician  Non healing lower extremity wounds Unna boots in place - change 2 x wk - last changed on 7/1 Followed by home health care.  Multiple sclerosis Stable. Not currently on medication.  Hyperlipidemia Continue statin  AKI Currently holding lisinopril and HCTZ. Creatinine now 1.06 with GFR > 60  Hypothyroidism Continue Synthroid    Procedures: none  Consultations:  none  Discharge Exam: Filed Vitals:   10/03/14 0353  BP: 119/87  Pulse: 112  Temp: 99.1 F (  37.3 C)  Resp: 17   General: Well developed, well nourished, NAD, appears stated age  HEENT: PER, EOMI, Anicteic Sclera, MMM. No pharyngeal erythema or exudates  Neck: Supple, no JVD, no masses  Cardiovascular: Tachycardic, S1 S2 auscultated, no rubs, murmurs or gallops.  Respiratory: Clear to auscultation bilaterally with equal chest rise Abdomen: Soft, nontender, nondistended, + bowel sounds   Extremities: warm dry without cyanosis clubbing or edema. Bilateral lower extremity Unna boots Neuro: AAOx3, cranial nerves grossly intact. Strength 5/5 in upper and lower extremities  Psych: Normal affect and demeanor with intact judgement and insight  Discharge Instructions   Discharge Instructions    Diet - low sodium heart healthy    Complete by:  As directed      Increase activity slowly    Complete by:  As directed           Current Discharge Medication List    START taking these medications   Details  vancomycin (VANCOCIN) 50 mg/mL oral solution Take 2.5 mLs (125 mg total) by mouth 4 (four) times daily. Qty: 100 mL, Refills: 0      CONTINUE these medications which have NOT CHANGED   Details  acetaminophen (TYLENOL) 325 MG tablet Take 325 mg by mouth every 6 (six) hours as needed for mild pain or moderate pain.    amiodarone (PACERONE) 200 MG tablet Take 200 mg by mouth daily.    aspirin 81 MG tablet Take 81 mg by mouth daily.    atorvastatin (LIPITOR) 80 MG tablet Take 80 mg by mouth daily.    Cholecalciferol (VITAMIN D3) 2000 UNITS TABS Take 1 tablet by mouth daily.    Dimethyl Fumarate (TECFIDERA) 240 MG CPDR Take 240 mg by mouth 2 (two) times daily.     gabapentin (NEURONTIN) 300 MG capsule Take 300 mg by mouth 3 (three) times daily.    levothyroxine (SYNTHROID, LEVOTHROID) 125 MCG tablet Take 125 mcg by mouth daily before breakfast.    metoprolol (LOPRESSOR) 50 MG tablet Take 50 mg by mouth 2 (two) times daily.      STOP taking these medications     hydrochlorothiazide (HYDRODIURIL) 50 MG tablet      lisinopril-hydrochlorothiazide (PRINZIDE,ZESTORETIC) 20-12.5 MG per tablet      pantoprazole (PROTONIX) 20 MG tablet      ipratropium (ATROVENT) 0.06 % nasal spray        No Known Allergies Follow-up Information    Follow up with Cheral Almas, MD In 2 weeks.   Specialty:  Orthopedic Surgery   Contact information:   [address] [place] Sa[place] [address] [phone]        The results of significant diagnostics from this hospitalization (including imaging, microbiology, ancillary and laboratory) are listed below for reference.    Significant Diagnostic Studies: Dg Pelvis 1-2 Views  10/01/2014   CLINICAL DATA:  Fall with pelvic pain  EXAM: PELVIS - 1-2 VIEW  COMPARISON:  None.  FINDINGS: Pelvic ring is intact. Lucency is noted through the midportion of the pubic symphysis on the right which may represent undisplaced fracture. Correlation with physical exam is recommended. If necessary CT may be helpful for further evaluation. Mild degenerative changes of the hip joints are noted bilaterally. Degenerative changes of lumbar spine are seen as well.  IMPRESSION: Lucency within the pubic symphysis on the right which may represent undisplaced fracture. Correlation with the physical exam is recommended.   Electronically Signed   By: Eulah Pont.D.  On: 10/01/2014 01:07   Ct Head Wo Contrast  10/01/2014   CLINICAL DATA:  Larey Seat this morning at home, hitting back of head. Anticoagulated.  EXAM: CT HEAD WITHOUT CONTRAST  TECHNIQUE: Contiguous axial images were obtained from the base of the skull through the vertex without intravenous contrast.  COMPARISON:  11/12/2013  FINDINGS: There is no intracranial hemorrhage or extra-axial fluid collection. There is moderate generalized atrophy. There is white matter hypodensity consistent with chronic small vessel ischemic disease. There are lacunar infarctions in the basal ganglia bilaterally, chronic. No acute findings are evident. Calvarium and skullbase are intact.  IMPRESSION: Negative for acute intracranial traumatic injury. There is moderate generalized atrophy, chronic microvascular ischemic disease and old lacunar infarctions.   Electronically Signed   By: Ellery Plunk M.D.   On: 10/01/2014 00:39    Microbiology: Recent Results (from the past 240 hour(s))  Culture, blood  (routine x 2)     Status: None (Preliminary result)   Collection Time: 09/30/14  1:00 PM  Result Value Ref Range Status   Specimen Description BLOOD LEFT HAND  Final   Special Requests BOTTLES DRAWN AEROBIC AND ANAEROBIC 5CC EACH  Final   Culture   Final    NO GROWTH 2 DAYS Performed at [facility]    Report Status PENDING  Incomplete  Culture, blood (routine x 2)     Status: None (Preliminary result)   Collection Time: 09/30/14  1:20 PM  Result Value Ref Range Status   Specimen Description BLOOD RIGHT FOREARM  Final   Special Requests BOTTLES DRAWN AEROBIC AND ANAEROBIC 5 CC EACH  Final   Culture   Final    NO GROWTH 2 DAYS Performed at [facility]    Report Status PENDING  Incomplete  Urine culture     Status: None   Collection Time: 09/30/14  3:05 PM  Result Value Ref Range Status   Specimen Description URINE, CLEAN CATCH  Final   Special Requests NONE  Final   Culture   Final    MULTIPLE SPECIES PRESENT, SUGGEST RECOLLECTION IF CLINICALLY INDICATED Performed at [facility]    Report Status 10/01/2014 FINAL  Final  Clostridium Difficile by PCR (not at [facility])     Status: Abnormal   Collection Time: 10/01/14  2:43 AM  Result Value Ref Range Status   C difficile by pcr POSITIVE (A) NEGATIVE Final    Comment: CRITICAL RESULT CALLED TO, READ BACK BY AND VERIFIED WITH:  Clarene Critchley AT [id number] BY KPEELE      Labs: Basic Metabolic Panel:  Recent Labs Lab 09/30/14 1320 10/01/14 0117 10/02/14 0434  NA 139 139 140  K 3.6 3.1* 4.1  CL 105 104 107  CO2 26 25 25   GLUCOSE 76 81 83  BUN 33* 23* 17  CREATININE 1.58* 1.41* 1.06  CALCIUM 8.0* 8.1* 8.1*   Liver Function Tests:  Recent Labs Lab 09/30/14 1320 10/02/14 0434  AST 26 22  ALT 17 15*  ALKPHOS 52 50  BILITOT 1.7* 1.1  PROT 6.0* 5.4*  ALBUMIN 2.9* 2.6*   CBC:  Recent Labs Lab 09/30/14 1320 10/01/14 0117 10/02/14 0434  WBC 13.3* 10.9* 7.6  NEUTROABS 10.8* 8.0* 5.0  HGB  12.6* 12.9* 11.8*  HCT 38.9* 38.7* 36.3*  MCV 93.7 90.6 91.9  PLT 213 201 125*   Cardiac Enzymes:  Recent Labs Lab 10/01/14 0117  CKTOTAL 464*    Signed:  D/C originally created by Dr. Pleas Koch  Addended by Algis Downs L,PA-C 7/3 [facility] 10/03/2014, 12:15 PM    In

## 2014-10-02 NOTE — Progress Notes (Addendum)
16:40 [facility] called [facility] and spoke with [facility] to give me an estimated time for report to be called to receiving RN and room number.  AOD states bed not assigned as of yet and they do admissions 24-7 and will call [phone] when bed has been assigned.  This CM will follow up in the am if pt is still here.   CM [facility] with a failed receipt received on each fax; CM calls [facility] AOD for alternative number and am told they are receiving medical records and fax number is tied up.  14:20 CM received successful transmission and confirmed with [facility] transfer packet is received and they state they will call back with bed assignment when they have looked over the packet.  Cm waiting for callback.

## 2014-10-03 DIAGNOSIS — S329XXA Fracture of unspecified parts of lumbosacral spine and pelvis, initial encounter for closed fracture: Secondary | ICD-10-CM

## 2014-10-03 DIAGNOSIS — A0472 Enterocolitis due to Clostridium difficile, not specified as recurrent: Secondary | ICD-10-CM

## 2014-10-03 DIAGNOSIS — Z8669 Personal history of other diseases of the nervous system and sense organs: Secondary | ICD-10-CM

## 2014-10-03 DIAGNOSIS — L8990 Pressure ulcer of unspecified site, unspecified stage: Secondary | ICD-10-CM | POA: Insufficient documentation

## 2014-10-03 DIAGNOSIS — S329XXS Fracture of unspecified parts of lumbosacral spine and pelvis, sequela: Secondary | ICD-10-CM

## 2014-10-03 DIAGNOSIS — D696 Thrombocytopenia, unspecified: Secondary | ICD-10-CM

## 2014-10-03 DIAGNOSIS — A047 Enterocolitis due to Clostridium difficile: Principal | ICD-10-CM

## 2014-10-03 MED ORDER — METOPROLOL TARTRATE 50 MG PO TABS
50.0000 mg | ORAL_TABLET | Freq: Two times a day (BID) | ORAL | Status: DC
  Administered 2014-10-03 – 2014-10-05 (×5): 50 mg via ORAL
  Filled 2014-10-03 (×4): qty 1
  Filled 2014-10-03: qty 4

## 2014-10-03 MED ORDER — VITAMIN D 1000 UNITS PO TABS
2000.0000 [IU] | ORAL_TABLET | Freq: Every day | ORAL | Status: DC
  Administered 2014-10-03 – 2014-10-05 (×3): 2000 [IU] via ORAL
  Filled 2014-10-03 (×3): qty 2

## 2014-10-03 NOTE — Discharge Summary (Deleted)
Physician Discharge Summary  Martin Powell JOA:[id number] DOB: [birth year] DOA: 09/30/2014  [name] date: 09/30/2014 Discharge date: 10/03/2014  Time spent: 25 minutes  Recommendations for Outpatient Follow-up:  1. Patient d/c to [facility] for further IP care 2. NO PPI due to C-diff diarrhea 3. Please consider cbc + Chem 12  In 2-3 days.         Might need work-up for ? Aptt       Duration of PO Vanc ~ 10 days ending on 10/12/14       Wound care to lower extremities.  Unna Boots changed every 2 day  Continue Oral Vancomycin thru 7/12 for C-diff diarrhea    Medication List    STOP taking these medications        hydrochlorothiazide 50 MG tablet  Commonly known as:  HYDRODIURIL     pantoprazole 20 MG tablet  Commonly known as:  PROTONIX      TAKE these medications        acetaminophen 325 MG tablet  Commonly known as:  TYLENOL  Take 325 mg by mouth every 6 (six) hours as needed for mild pain or moderate pain.     amiodarone 200 MG tablet  Commonly known as:  PACERONE  Take 200 mg by mouth daily.     aspirin 81 MG tablet  Take 81 mg by mouth daily.     atorvastatin 80 MG tablet  Commonly known as:  LIPITOR  Take 80 mg by mouth daily.     gabapentin 300 MG capsule  Commonly known as:  NEURONTIN  Take 300 mg by mouth 3 (three) times daily.     levothyroxine 125 MCG tablet  Commonly known as:  SYNTHROID, LEVOTHROID  Take 125 mcg by mouth daily before breakfast.     lisinopril-hydrochlorothiazide 20-12.5 MG per tablet  Commonly known as:  PRINZIDE,ZESTORETIC  Take 1 tablet by mouth daily.     metoprolol 50 MG tablet  Commonly known as:  LOPRESSOR  Take 50 mg by mouth 2 (two) times daily.     TECFIDERA 240 MG Cpdr  Generic drug:  Dimethyl Fumarate  Take 240 mg by mouth 2 (two) times daily.     vancomycin 50 mg/mL oral solution  Commonly known as:  VANCOCIN  Take 2.5 mLs (125 mg total) by mouth 4 (four) times daily.     Vitamin D3 2000 UNITS  Tabs  Take 1 tablet by mouth daily.         Discharge Diagnoses:  Principal Problem:   Dehydration Active Problems:   Hypotension   Diarrhea   Fall   Hypothyroidism   History of multiple sclerosis   Hyperlipidemia   Multiple sclerosis   Hypercholesteremia   Pressure ulcer  Consultants:  None  Procedures:  none  Antibiotics:  flagyll IV-->stopped  PO vancomycin  Discharge Condition: stable  Diet recommendation: hh low salt  Filed Weights   09/30/14 1854 09/30/14 2236 10/02/14 0358  Weight: 77.111 kg (170 lb) 109.498 kg (241 lb 6.4 oz) 109.256 kg (240 lb 13.9 oz)    History of present illness:  69 y/o ? h/o Htn, Multiple sclerosis on Tecfidera, Afib CHad2Vasc2 score~2-3 not on Sys AC, chr Hypotension c  h/o hypertension, chr Venous insufficiency, hypothryoidism, HLd, CLD stg Estimated Creatinine Clearance: 87.8 mL/min (by C-G formula based on Cr of 1.06). II, Severe P-EM. Admit c 1 wk h/o diarrhea in a setting of recent use clindamycin for LE's-found to be hypotensive  and s/p fall -found to be CDIFF +   Hospital Course:    1. CDIFF colitis-d/c flagyll-PO Vancomycin 125 mg qid.  CBC am.  D/c any PPI for a prolonged period of time.  Will need to limit Abx use 2. R hip pain-pelvic fracture-non-operative-d/c Dr. Luane School f/u as OP with ortho 3. Hypotension with h/o htn-hold any further ant-htn meds-on d/c i did resume Lisinopril-hctz 4. Afib rate controlled-not on NOAC CHad2Vasc2 score=2-3-Continue Amiodarone 200 daily.  Would continue ASA 81 daily-would consider OP discussion re: coumadin.  Restarted metoprolol 7/3. 5. H/o Multiple sclerosis-diagnosed~ 8-10 yr ago-continue Tecfidera as OP-Needs OP neurology input 6. hypothyroid-consider TSH 1 mo as on chr Amiodarone 7. hld -continue statin 8. Chr Anemia-stable-no further in-patient w/u.  Likely dilutional component 9. Non-displaced pubic symphysis # R side-WBAT-no role for surgery-may follow up with Ortho ~ 2  weeks 10. Severe P-EM-not seve--need sOP management 11. Estimated Creatinine Clearance: 87.8 mL/min (by C-G formula based on Cr of 1.06). CKD II 12. Thrombocytopenia.  Platelets have dropped to 125 on 7/3.  May be acute phase reactant.  Please follow.    Procedures: none  Consultations:  none  Discharge Exam: Filed Vitals:   10/03/14 0353  BP: 119/87  Pulse: 112  Temp: 99.1 F (37.3 C)  Resp: 17   General: Well developed, well nourished, NAD, appears stated age  HEENT: PER, EOMI, Anicteic Sclera, MMM. No pharyngeal erythema or exudates  Neck: Supple, no JVD, no masses  Cardiovascular: Tachycardic, S1 S2 auscultated, no rubs, murmurs or gallops.  Respiratory: Clear to auscultation bilaterally with equal chest rise Abdomen: Soft, nontender, nondistended, + bowel sounds  Extremities: warm dry without cyanosis clubbing or edema. Bilateral lower extremity Unna boots Neuro: AAOx3, cranial nerves grossly intact. Strength 5/5 in upper and lower extremities  Psych: Normal affect and demeanor with intact judgement and insight  Discharge Instructions   Discharge Instructions    Diet - low sodium heart healthy    Complete by:  As directed      Increase activity slowly    Complete by:  As directed           Current Discharge Medication List    START taking these medications   Details  vancomycin (VANCOCIN) 50 mg/mL oral solution Take 2.5 mLs (125 mg total) by mouth 4 (four) times daily. Qty: 100 mL, Refills: 0      CONTINUE these medications which have NOT CHANGED   Details  acetaminophen (TYLENOL) 325 MG tablet Take 325 mg by mouth every 6 (six) hours as needed for mild pain or moderate pain.    amiodarone (PACERONE) 200 MG tablet Take 200 mg by mouth daily.    aspirin 81 MG tablet Take 81 mg by mouth daily.    atorvastatin (LIPITOR) 80 MG tablet Take 80 mg by mouth daily.    Cholecalciferol (VITAMIN D3) 2000 UNITS TABS Take 1 tablet by mouth daily.     Dimethyl Fumarate (TECFIDERA) 240 MG CPDR Take 240 mg by mouth 2 (two) times daily.     gabapentin (NEURONTIN) 300 MG capsule Take 300 mg by mouth 3 (three) times daily.    levothyroxine (SYNTHROID, LEVOTHROID) 125 MCG tablet Take 125 mcg by mouth daily before breakfast.    lisinopril-hydrochlorothiazide (PRINZIDE,ZESTORETIC) 20-12.5 MG per tablet Take 1 tablet by mouth daily.    metoprolol (LOPRESSOR) 50 MG tablet Take 50 mg by mouth 2 (two) times daily.      STOP taking these medications  hydrochlorothiazide (HYDRODIURIL) 50 MG tablet      pantoprazole (PROTONIX) 20 MG tablet      ipratropium (ATROVENT) 0.06 % nasal spray        No Known Allergies Follow-up Information    Follow up with Cheral Almas, MD In 2 weeks.   Specialty:  Orthopedic Surgery   Contact information:   [address] [place] [place] [address] [phone]        The results of significant diagnostics from this hospitalization (including imaging, microbiology, ancillary and laboratory) are listed below for reference.    Significant Diagnostic Studies: Dg Pelvis 1-2 Views  10/01/2014   CLINICAL DATA:  Fall with pelvic pain  EXAM: PELVIS - 1-2 VIEW  COMPARISON:  None.  FINDINGS: Pelvic ring is intact. Lucency is noted through the midportion of the pubic symphysis on the right which may represent undisplaced fracture. Correlation with physical exam is recommended. If necessary CT may be helpful for further evaluation. Mild degenerative changes of the hip joints are noted bilaterally. Degenerative changes of lumbar spine are seen as well.  IMPRESSION: Lucency within the pubic symphysis on the right which may represent undisplaced fracture. Correlation with the physical exam is recommended.   Electronically Signed   By: Alcide Clever M.D.   On: 10/01/2014 01:07   Ct Head Wo Contrast  10/01/2014   CLINICAL DATA:  Larey Seat this morning at home, hitting back of head. Anticoagulated.  EXAM: CT HEAD WITHOUT  CONTRAST  TECHNIQUE: Contiguous axial images were obtained from the base of the skull through the vertex without intravenous contrast.  COMPARISON:  11/12/2013  FINDINGS: There is no intracranial hemorrhage or extra-axial fluid collection. There is moderate generalized atrophy. There is white matter hypodensity consistent with chronic small vessel ischemic disease. There are lacunar infarctions in the basal ganglia bilaterally, chronic. No acute findings are evident. Calvarium and skullbase are intact.  IMPRESSION: Negative for acute intracranial traumatic injury. There is moderate generalized atrophy, chronic microvascular ischemic disease and old lacunar infarctions.   Electronically Signed   By: Ellery Plunk M.D.   On: 10/01/2014 00:39    Microbiology: Recent Results (from the past 240 hour(s))  Culture, blood (routine x 2)     Status: None (Preliminary result)   Collection Time: 09/30/14  1:00 PM  Result Value Ref Range Status   Specimen Description BLOOD LEFT HAND  Final   Special Requests BOTTLES DRAWN AEROBIC AND ANAEROBIC 5CC EACH  Final   Culture   Final    NO GROWTH 2 DAYS Performed at [facility]    Report Status PENDING  Incomplete  Culture, blood (routine x 2)     Status: None (Preliminary result)   Collection Time: 09/30/14  1:20 PM  Result Value Ref Range Status   Specimen Description BLOOD RIGHT FOREARM  Final   Special Requests BOTTLES DRAWN AEROBIC AND ANAEROBIC 5 CC EACH  Final   Culture   Final    NO GROWTH 2 DAYS Performed at [facility]    Report Status PENDING  Incomplete  Urine culture     Status: None   Collection Time: 09/30/14  3:05 PM  Result Value Ref Range Status   Specimen Description URINE, CLEAN CATCH  Final   Special Requests NONE  Final   Culture   Final    MULTIPLE SPECIES PRESENT, SUGGEST RECOLLECTION IF CLINICALLY INDICATED Performed at [facility]    Report Status 10/01/2014 FINAL  Final  Clostridium Difficile by  PCR (not at [facility])     Status: Abnormal   Collection Time: 10/01/14  2:43 AM  Result Value Ref Range Status   C difficile by pcr POSITIVE (A) NEGATIVE Final    Comment: CRITICAL RESULT CALLED TO, READ BACK BY AND VERIFIED WITH:  Clarene Critchley AT [id number] BY KPEELE      Labs: Basic Metabolic Panel:  Recent Labs Lab 09/30/14 1320 10/01/14 0117 10/02/14 0434  NA 139 139 140  K 3.6 3.1* 4.1  CL 105 104 107  CO2 GLUCOSE 76 81 83  BUN 33* 23* 17  CREATININE 1.58* 1.41* 1.06  CALCIUM 8.0* 8.1* 8.1*   Liver Function Tests:  Recent Labs Lab 09/30/14 1320 10/02/14 0434  AST 26 22  ALT 17 15*  ALKPHOS 52 50  BILITOT 1.7* 1.1  PROT 6.0* 5.4*  ALBUMIN 2.9* 2.6*   CBC:  Recent Labs Lab 09/30/14 1320 10/01/14 0117 10/02/14 0434  WBC 13.3* 10.9* 7.6  NEUTROABS 10.8* 8.0* 5.0  HGB 12.6* 12.9* 11.8*  HCT 38.9* 38.7* 36.3*  MCV 93.7 90.6 91.9  PLT 213 201 125*   Cardiac Enzymes:  Recent Labs Lab 10/01/14 0117  CKTOTAL 464*    Signed:  D/C originally created by Dr. Pleas Koch Addended by Algis Downs L,PA-C 7/3 [facility] 10/03/2014, 9:13 AM    In

## 2014-10-03 NOTE — Progress Notes (Signed)
PROGRESS NOTE  Martin Powell ZOX:[id number] DOB: March 18, 1946 DOA: 09/30/2014 PCP: No PCP Per Patient  69 year old male with nonhealing lower extremity wounds, multiple sclerosis, hypothyroidism who presents to the emergency department on 6/30 after a fall with hypotension. He was found to have C. difficile diarrhea and started on PO vancomycin. He is a Cytogeneticist. The plan is to transfer him to the [facility] when a bed is available.  Otherwise, he may be able to discharge to home in the next 24-48 hours with appropriate follow-up.  Assessment/Plan:  C-diff diarrhea Continue Vancomycin PO until 7/12.  D/C PPI. Patient with tenesmus and flatulence overnight and one large bm this morning.  No further watery stools. Physical therapy has seen the patient and recommends home health PT with out of bed supervision for mobility. Please ambulate patient in the hallway with his walker multiple times per day.  Thrombocytopenia Drop in platelets from 213 on admission to 125 on 7/3. Possibly due to acute infection vs Lovenox -Will check CBC on 7/4 if he still remains at [facility] - cont Lovenox for today  Right hip pain with right pelvic fracture Secondary to fall prior to admission. Nonoperative nondisplaced pelvic fracture seen on x-ray 7/1 Follow-up outpatient with Dr. Roda Shutters of orthopedic surgery.  Hypotension in the setting of a history of hypertension Will restart Lopressor 7/3 as his pulse rate is climbing and his blood pressure stable. Will continue to hold HCTZ, and lisinopril.  Atrial fibrillation On amiodarone. Lopressor resumed today 7/3. Daily 81 mg aspirin. ChadsVasc score is at least 2. He is not currently on anticoagulation.  He has history of heart murmur. Uncertain if he has valvular disease- we don't have an ECHO in our system. Will defer to his outpatient primary care physician  Non healing lower extremity wounds Unna boots in place - change 2 x wk - last changed on 7/1   Followed by home health care.  Multiple sclerosis Stable. Not currently on medication.  Hyperlipidemia Continue statin  AKI Currently holding lisinopril and HCTZ. Creatinine now 1.06 with GFR > 60  Hypothyroidism Continue Synthroid   DVT Prophylaxis:  Lovenox  Code Status: Full Family Communication: Patient is alert, oriented and understands plan of care. [facility], would d/c home with HHPT when stable- follow platelets   Consultants:  None  Procedures:  None  Antibiotics: Anti-infectives    Start     Dose/Rate Route Frequency Ordered Stop   10/02/14 0000  vancomycin (VANCOCIN) 50 mg/mL oral solution     125 mg Oral 4 times daily 10/02/14 1139     10/01/14 1030  vancomycin (VANCOCIN) 50 mg/mL oral solution 125 mg     125 mg Oral 4 times daily 10/01/14 1020 10/15/14 0959   10/01/14 0000  metroNIDAZOLE (FLAGYL) IVPB 500 mg  Status:  Discontinued     500 mg 100 mL/hr over 60 Minutes Intravenous Every 8 hours 09/30/14 2319 10/01/14 1116       Objective: Filed Vitals:   10/02/14 2000 10/02/14 2020 10/03/14 0014 10/03/14 0353  BP: 133/105 145/97 116/77 119/87  Pulse: 100 105 106 112  Temp: 98.5 F (36.9 C)  97.8 F (36.6 C) 99.1 F (37.3 C)  TempSrc: Oral  Oral Oral  Resp: 11 0 18 17  Height:      Weight:      SpO2: 100%  100% 100%    Intake/Output Summary (Last 24 hours) at 10/03/14 8119 Last data filed  at 10/03/14 0321  Gross per 24 hour  Intake    490 ml  Output    626 ml  Net   -136 ml   Filed Weights   09/30/14 1854 09/30/14 2236 10/02/14 0358  Weight: 77.111 kg (170 lb) 109.498 kg (241 lb 6.4 oz) 109.256 kg (240 lb 13.9 oz)    Exam: General: Well developed, well nourished, NAD, appears stated age  HEENT:  PER, EOMI, Anicteic Sclera, MMM. No pharyngeal erythema or exudates  Neck: Supple, no JVD, no masses  Cardiovascular: Tachycardic, S1 S2 auscultated, no rubs, murmurs or gallops.   Respiratory: Clear to  auscultation bilaterally with equal chest rise Abdomen: Soft, nontender, nondistended, + bowel sounds  Extremities: warm dry without cyanosis clubbing or edema. Bilateral lower extremity Unna boots Neuro: AAOx3, cranial nerves grossly intact. Strength 5/5 in upper and lower extremities  Psych: Normal affect and demeanor with intact judgement and insight   Data Reviewed: Basic Metabolic Panel:  Recent Labs Lab 09/30/14 1320 10/01/14 0117 10/02/14 0434  NA 139 139 140  K 3.6 3.1* 4.1  CL 105 104 107  CO2 26 25 25   GLUCOSE 76 81 83  BUN 33* 23* 17  CREATININE 1.58* 1.41* 1.06  CALCIUM 8.0* 8.1* 8.1*   Liver Function Tests:  Recent Labs Lab 09/30/14 1320 10/02/14 0434  AST 26 22  ALT 17 15*  ALKPHOS 52 50  BILITOT 1.7* 1.1  PROT 6.0* 5.4*  ALBUMIN 2.9* 2.6*   CBC:  Recent Labs Lab 09/30/14 1320 10/01/14 0117 10/02/14 0434  WBC 13.3* 10.9* 7.6  NEUTROABS 10.8* 8.0* 5.0  HGB 12.6* 12.9* 11.8*  HCT 38.9* 38.7* 36.3*  MCV 93.7 90.6 91.9  PLT 213 201 125*   Cardiac Enzymes:  Recent Labs Lab 10/01/14 0117  CKTOTAL 464*     Recent Results (from the past 240 hour(s))  Culture, blood (routine x 2)     Status: None (Preliminary result)   Collection Time: 09/30/14  1:00 PM  Result Value Ref Range Status   Specimen Description BLOOD LEFT HAND  Final   Special Requests BOTTLES DRAWN AEROBIC AND ANAEROBIC 5CC EACH  Final   Culture   Final    NO GROWTH 2 DAYS Performed at [facility]    Report Status PENDING  Incomplete  Culture, blood (routine x 2)     Status: None (Preliminary result)   Collection Time: 09/30/14  1:20 PM  Result Value Ref Range Status   Specimen Description BLOOD RIGHT FOREARM  Final   Special Requests BOTTLES DRAWN AEROBIC AND ANAEROBIC 5 CC EACH  Final   Culture   Final    NO GROWTH 2 DAYS Performed at [facility]    Report Status PENDING  Incomplete  Urine culture     Status: None   Collection Time: 09/30/14  3:05 PM   Result Value Ref Range Status   Specimen Description URINE, CLEAN CATCH  Final   Special Requests NONE  Final   Culture   Final    MULTIPLE SPECIES PRESENT, SUGGEST RECOLLECTION IF CLINICALLY INDICATED Performed at [facility]    Report Status 10/01/2014 FINAL  Final  Clostridium Difficile by PCR (not at [facility])     Status: Abnormal   Collection Time: 10/01/14  2:43 AM  Result Value Ref Range Status   C difficile by pcr POSITIVE (A) NEGATIVE Final    Comment: CRITICAL RESULT CALLED TO, READ BACK BY AND VERIFIED WITH:  J UYOO,RN AT  [phone] BY KPEELE      Studies: No results found.  Scheduled Meds: . amiodarone  200 mg Oral Daily  . aspirin  81 mg Oral Daily  . atorvastatin  80 mg Oral Daily  . cholecalciferol  2,000 Units Oral Daily  . Dimethyl Fumarate  240 mg Oral BID  . enoxaparin (LOVENOX) injection  40 mg Subcutaneous Q24H  . feeding supplement (ENSURE ENLIVE)  237 mL Oral BID BM  . gabapentin  300 mg Oral TID  . ipratropium  2 spray Each Nare QID  . levothyroxine  125 mcg Oral QAC breakfast  . metoprolol  50 mg Oral BID  . multivitamin with minerals  1 tablet Oral Daily  . potassium chloride  40 mEq Oral BID  . vancomycin  125 mg Oral QID   Continuous Infusions:     Algis Downs, PA-C  [facility] Pager [phone]. If 7PM-7AM, please contact night-coverage at [email or web address], password [facility] 10/03/2014, 9:05 AM  LOS: 3 days      I have evaluated the patient, reviewed the chart and modified the above note which I agree with.   Calvert Cantor, MD Pager: [email or web address]

## 2014-10-04 DIAGNOSIS — I952 Hypotension due to drugs: Secondary | ICD-10-CM

## 2014-10-04 DIAGNOSIS — S329XXA Fracture of unspecified parts of lumbosacral spine and pelvis, initial encounter for closed fracture: Secondary | ICD-10-CM

## 2014-10-04 DIAGNOSIS — G35 Multiple sclerosis: Secondary | ICD-10-CM

## 2014-10-04 DIAGNOSIS — W19XXXA Unspecified fall, initial encounter: Secondary | ICD-10-CM

## 2014-10-04 DIAGNOSIS — E780 Pure hypercholesterolemia: Secondary | ICD-10-CM

## 2014-10-04 LAB — CBC
HCT: 40.1 % (ref 39.0–52.0)
Hemoglobin: 13 g/dL (ref 13.0–17.0)
MCH: 29.5 pg (ref 26.0–34.0)
MCHC: 32.4 g/dL (ref 30.0–36.0)
MCV: 90.9 fL (ref 78.0–100.0)
Platelets: 229 10*3/uL (ref 150–400)
RBC: 4.41 MIL/uL (ref 4.22–5.81)
RDW: 12.8 % (ref 11.5–15.5)
WBC: 11.8 10*3/uL — ABNORMAL HIGH (ref 4.0–10.5)

## 2014-10-04 MED ORDER — VANCOMYCIN 50 MG/ML ORAL SOLUTION: 125.0000 mg | Freq: Four times a day (QID) | ORAL | Status: AC

## 2014-10-04 NOTE — Consult Note (Signed)
Physical Medicine and Rehabilitation Consult Reason for Consult:weakness and pelvic pain  Referring Physician: Radonna Ricker   HPI: Martin Powell is a 69 y.o. male with MS and chronic right sided weakness, chronic wounds who after recent diarrhea fell on 6/29 and was found hypotensive in his house by EMS. He had pain in his right pelvis/thigh after fall. He was found to have c diff enteritis treated with po vancomycin as well as a right sided pubic symphysis racture. Conservative care was recommended. This patient failed to progress and rehab was consulted for recommendations   Review of Systems  Constitutional: Negative for fever.  Eyes: Negative for double vision.  Cardiovascular: Negative for palpitations.  Musculoskeletal: Positive for back pain, joint pain and falls.  Neurological: Positive for sensory change and speech change.   Past Medical History  Diagnosis Date  . Hypertension   . GERD (gastroesophageal reflux disease)   . Hypercholesteremia   . Thyroid activity decreased   . Multiple sclerosis   . Murmur, heart    Past Surgical History  Procedure Laterality Date  . Appendectomy     Family History  Problem Relation Age of Onset  . CAD Neg Hx    Social History:  reports that he has never smoked. He does not have any smokeless tobacco history on file. He reports that he does not drink alcohol or use illicit drugs. Allergies: No Known Allergies Medications Prior to Admission  Medication Sig Dispense Refill  . acetaminophen (TYLENOL) 325 MG tablet Take 325 mg by mouth every 6 (six) hours as needed for mild pain or moderate pain.    Marland Kitchen amiodarone (PACERONE) 200 MG tablet Take 200 mg by mouth daily.    Marland Kitchen aspirin 81 MG tablet Take 81 mg by mouth daily.    Marland Kitchen atorvastatin (LIPITOR) 80 MG tablet Take 80 mg by mouth daily.    . Cholecalciferol (VITAMIN D3) 2000 UNITS TABS Take 1 tablet by mouth daily.    . Dimethyl Fumarate (TECFIDERA) 240 MG CPDR Take 240 mg by mouth 2 (two)  times daily.     Marland Kitchen gabapentin (NEURONTIN) 300 MG capsule Take 300 mg by mouth 3 (three) times daily.    . hydrochlorothiazide (HYDRODIURIL) 50 MG tablet Take 50 mg by mouth daily.    Marland Kitchen levothyroxine (SYNTHROID, LEVOTHROID) 125 MCG tablet Take 125 mcg by mouth daily before breakfast.    . lisinopril-hydrochlorothiazide (PRINZIDE,ZESTORETIC) 20-12.5 MG per tablet Take 1 tablet by mouth daily.    . metoprolol (LOPRESSOR) 50 MG tablet Take 50 mg by mouth 2 (two) times daily.    . pantoprazole (PROTONIX) 20 MG tablet Take 20 mg by mouth daily.      Home: Home Living Family/patient expects to be discharged to:: Private residence Living Arrangements: Alone Available Help at Discharge: Family, Available PRN/intermittently Type of Home: Apartment Home Layout: One level Home Equipment: Environmental consultant - 2 wheels, Shower seat, Grab bars - tub/shower  Functional History: Prior Function Level of Independence: Independent with assistive device(s), Needs assistance Gait / Transfers Assistance Needed: Uses RW for gait ADL's / Homemaking Assistance Needed: Sister assists with housekeeping and errands (patient does not drive).  Patient reports he has someone in the house when he showers. Functional Status:  Mobility: Bed Mobility Overal bed mobility: Needs Assistance Bed Mobility: Rolling, Sidelying to Sit Rolling: Supervision Sidelying to sit: Max assist Sit to supine: Min guard General bed mobility comments: patient unable to bring shoulders off bed and find midline on side  of bed.  required max assist to reach upright and balance on edge of bed. Transfers Overall transfer level: Needs assistance Equipment used: Rolling walker (2 wheeled) Transfers: Sit to/from Stand Sit to Stand: Mod assist General transfer comment: patient required assist to power up and max verbal cues for sequencing Ambulation/Gait Ambulation/Gait assistance: Mod assist, +2 physical assistance Ambulation Distance (Feet): 10  Feet Assistive device: Rolling walker (2 wheeled) General Gait Details: patient with severe lean to right upon standing, requiring max assist to maintain upright.  On second attempt at standing/gait, patient better able to reach upright, however, after ambulating 10 feel standing with extremely wide base of support, feel slipping, leaning severly to right and requiring max assit to maintin upright.  During this time, patient reporting "i am fine" and "I can make it" when in this PT's judgement, pattient about to slide to floor.   Gait Pattern/deviations: Step-to pattern, Decreased step length - right, Shuffle, Wide base of support Gait velocity: Decreased Gait velocity interpretation: <1.8 ft/sec, indicative of risk for recurrent falls    ADL:    Cognition: Cognition Overall Cognitive Status: Impaired/Different from baseline Cognition Arousal/Alertness: Awake/alert Behavior During Therapy: WFL for tasks assessed/performed Overall Cognitive Status: Impaired/Different from baseline Area of Impairment: Safety/judgement Safety/Judgement: Decreased awareness of safety General Comments: patient with extremely wide base of support and this PT felt patient was about to fall and patient reports he is "fine" and "will make it".    Blood pressure 135/95, pulse 108, temperature 98.1 F (36.7 C), temperature source Oral, resp. rate 18, height 6\' 3"  (1.905 m), weight 109.355 kg (241 lb 1.3 oz), SpO2 95 %. Physical Exam  Constitutional: He appears well-developed.  HENT:  Head: Normocephalic.  Musculoskeletal:  Right sided weakness. Right lower ext limited by pain    Results for orders placed or performed during the hospital encounter of 09/30/14 (from the past 24 hour(s))  CBC     Status: Abnormal   Collection Time: 10/04/14  4:37 AM  Result Value Ref Range   WBC 11.8 (H) 4.0 - 10.5 K/uL   RBC 4.41 4.22 - 5.81 MIL/uL   Hemoglobin 13.0 13.0 - 17.0 g/dL   HCT 52.8 41.3 - 24.4 %   MCV 90.9 78.0  - 100.0 fL   MCH 29.5 26.0 - 34.0 pg   MCHC 32.4 30.0 - 36.0 g/dL   RDW 01.0 27.2 - 53.6 %   Platelets 229 150 - 400 K/uL   No results found.  Assessment/Plan: Diagnosis: pelvic fracture, deconditioning in MS patient related to C diff enteritis 1. Does the need for close, 24 hr/day medical supervision in concert with the patient's rehab needs make it unreasonable for this patient to be served in a less intensive setting? Yes 2. Co-Morbidities requiring supervision/potential complications: a fib, ms 3. Due to bladder management, bowel management and skin/wound care, does the patient require 24 hr/day rehab nursing? Yes 4. Does the patient require coordinated care of a physician, rehab nurse, PT (1-2 hrs/day, 5 days/week) and OT (1-2 hrs/day, 5 days/week) to address physical and functional deficits in the context of the above medical diagnosis(es)? Yes Addressing deficits in the following areas: balance, endurance, strength, transferring, dressing, grooming and cognition 5. Can the patient actively participate in an intensive therapy program of at least 3 hrs of therapy per day at least 5 days per week? Potentially 6. The potential for patient to make measurable gains while on inpatient rehab is good 7. Anticipated functional outcomes upon discharge from  inpatient rehab are supervision  with PT, supervision with OT, n/a with SLP. 8. Estimated rehab length of stay to reach the above functional goals is: 7 days 9. Does the patient have adequate social supports and living environment to accommodate these discharge functional goals? Potentially 10. Anticipated D/C setting: Home 11. Anticipated post D/C treatments: HH therapy 12. Overall Rehab/Functional Prognosis: good  RECOMMENDATIONS: This patient's condition is appropriate for continued rehabilitative care in the following setting: CIR Patient has agreed to participate in recommended program. Potentially Note that insurance prior  authorization may be required for reimbursement for recommended care.  Comment: Rehab Admissions Coordinator to follow up.  Thanks,  Ranelle Oyster, MD, [place]     10/04/2014

## 2014-10-04 NOTE — Progress Notes (Signed)
Rehab Admissions Coordinator Note:  Patient was screened by Trish Mage for appropriateness for an Inpatient Acute Rehab Consult.  At this time, an inpatient rehab consult has been ordered.  We will follow up once rehab consult is completed by rehab MD.  Lelon Frohlich M 10/04/2014, 11:37 AM  I can be reached at [phone].

## 2014-10-04 NOTE — Care Management Note (Signed)
Case Management Note  Patient Details  Name: Martin Powell MRN: [id number] Date of Birth: Jul 09, 1945  Subjective/Objective:                    Action/Plan:   Expected Discharge Date:                  Expected Discharge Plan:  Acute to Acute Transfer [name])  In-House Referral:     Discharge planning Services  CM Consult  Post Acute Care Choice:    Choice offered to:     DME Arranged:    DME Agency:     HH Arranged:    HH Agency:     Status of Service:  Completed, signed off  Medicare Important Message Given:  Yes-second notification given Date Medicare IM Given:    Medicare IM give by:    Date Additional Medicare IM Given:   10/04/2014 Additional Medicare Important Message give by:   Georgianne Fick  If discussed at [employer], dates discussed:    Additional Comments:  Governor Specking, RN 10/04/2014, 11:38 AM

## 2014-10-04 NOTE — Progress Notes (Signed)
Physical Therapy Treatment Patient Details Name: Martin Powell MRN: [id number] DOB: May 13, 1945 Today's Date: 10/04/2014    History of Present Illness Patient is a 69 yo male admitted 09/30/14 following a fall.  Patient with dehydration, hypotension, and diarrhea.  Patient with nondisplaced pubic fx (WBAT).  PMH:  Chronic bil LE wounds - Unna boots, MS, HTN, Afib, RLE weakness    PT Comments    Patient did not do as well today as on eval - requiring significantly more assistance and having more issues with balance.  Patient required +2 assistance during therapy today and apparently has only sister at home and intermittent aide assistance.   Recommend continued inpatient therapy in hopes of returning home.  Recommend CIR consult at this time.  Will continue to follow to progress mobility and independence.   Texted Dr. Robb Matar to share the above.   Follow Up Recommendations  CIR;Supervision/Assistance - 24 hour     Equipment Recommendations  None recommended by PT    Recommendations for Other Services Rehab consult     Precautions / Restrictions Precautions Precautions: Fall Restrictions Weight Bearing Restrictions: No RLE Weight Bearing: Weight bearing as tolerated    Mobility  Bed Mobility Overal bed mobility: Needs Assistance Bed Mobility: Rolling;Sidelying to Sit Rolling: Supervision Sidelying to sit: Max assist       General bed mobility comments: patient unable to bring shoulders off bed and find midline on side of bed.  required max assist to reach upright and balance on edge of bed.  Transfers Overall transfer level: Needs assistance Equipment used: Rolling walker (2 wheeled) Transfers: Sit to/from Stand Sit to Stand: Mod assist         General transfer comment: patient required assist to power up and max verbal cues for sequencing  Ambulation/Gait Ambulation/Gait assistance: Mod assist;+2 physical assistance Ambulation Distance (Feet): 10 Feet Assistive device:  Rolling walker (2 wheeled) Gait Pattern/deviations: Step-to pattern;Decreased step length - right;Shuffle;Wide base of support Gait velocity: Decreased Gait velocity interpretation: <1.8 ft/sec, indicative of risk for recurrent falls General Gait Details: patient with severe lean to right upon standing, requiring max assist to maintain upright.  On second attempt at standing/gait, patient better able to reach upright, however, after ambulating 10 feel standing with extremely wide base of support, feel slipping, leaning severly to right and requiring max assit to maintin upright.  During this time, patient reporting "i am fine" and "I can make it" when in this PT's judgement, pattient about to slide to floor.     Stairs            Wheelchair Mobility    Modified Rankin (Stroke Patients Only)       Balance Overall balance assessment: Needs assistance Sitting-balance support: Bilateral upper extremity supported Sitting balance-Leahy Scale: Zero     Standing balance support: Bilateral upper extremity supported Standing balance-Leahy Scale: Zero                      Cognition Arousal/Alertness: Awake/alert Behavior During Therapy: WFL for tasks assessed/performed Overall Cognitive Status: Impaired/Different from baseline Area of Impairment: Safety/judgement         Safety/Judgement: Decreased awareness of safety     General Comments: patient with extremely wide base of support and this PT felt patient was about to fall and patient reports he is "fine" and "will make it".      Exercises      General Comments        Pertinent Vitals/Pain  Pain Assessment: No/denies pain    Home Living                      Prior Function            PT Goals (current goals can now be found in the care plan section) Progress towards PT goals: Not progressing toward goals - comment (weaker today than evaluation)    Frequency  Min 3X/week    PT Plan Discharge  plan needs to be updated    Co-evaluation             End of Session Equipment Utilized During Treatment: Gait belt Activity Tolerance: Patient limited by fatigue Patient left: in chair;with call bell/phone within reach     Time: 1030-1104 PT Time Calculation (min) (ACUTE ONLY): 34 min  Charges:  $Gait Training: 8-22 mins $Therapeutic Activity: 8-22 mins                    G Codes:      Olivia Canter 2014-10-30, 11:18 AM  2014-10-30 Corlis Hove, PT [phone]

## 2014-10-04 NOTE — Progress Notes (Signed)
Orthopedic Tech Progress Note Patient Details:  Martin Powell October 06, 1945 [id number]  Ortho Devices Type of Ortho Device: Roland Rack boot Ortho Device/Splint Location: bilateral Ortho Device/Splint Interventions: Application   Mariluz Crespo 10/04/2014, 11:07 AM

## 2014-10-04 NOTE — Progress Notes (Signed)
CM collaborates with patient's RN Alphonzo Lemmings and Lupita Leash SW about possible discharge planning if patient will need to be transferred to [facility] or SNIF if unable to participate in CIR.  [facility] due to no change in level of care, option is to discharge to [facility].  CM spoke with patient and sister at bedside, sister says [place] will not accept patient and sister wants to do POA form while patient is able to make own decision, patient's RN says she will get that taken care of for patient and family.

## 2014-10-04 NOTE — Discharge Summary (Signed)
Physician Discharge Summary  Martin Powell WUJ:[id number] DOB: [birth year] DOA: 09/30/2014  PCP: No PCP Per Patient  Admit date: 09/30/2014 Discharge date: 10/04/2014  Time spent: 35 minutes  Recommendations for Outpatient Follow-up:  1. NO PPI due to C-diff diarrhea 2. Please consider cbc + Chem 12 In 2-3 days. 4.  Might need work-up for ? Aptt 5.  Duration of PO Vanc ~ 14 days ending on 10/14/14 6.  Wound care to lower extremities. Unna Boots changed every 2 day 7. Continue Oral Vancomycin thru 7/12 for C-diff diarrhea 8.  Discharge Diagnoses:  Principal Problem:   Enteritis due to Clostridium difficile Active Problems:   Hypotension   Dehydration   Hypothyroidism   Hyperlipidemia   Multiple sclerosis   Hypercholesteremia   Pressure ulcer   Pelvic fracture   Discharge Condition: stable  Diet recommendation: regular  Filed Weights   09/30/14 2236 10/02/14 0358 10/04/14 0501  Weight: 109.498 kg (241 lb 6.4 oz) 109.256 kg (240 lb 13.9 oz) 109.355 kg (241 lb 1.3 oz)    History of present illness:  69 y/o ? h/o Htn, Multiple sclerosis on Tecfidera, Afib CHad2Vasc2 score~2-3 not on Sys AC, chr Hypotension c h/o hypertension, chr Venous insufficiency, hypothryoidism, HLd, CLD stg Estimated Creatinine Clearance: 87.8 mL/min (by C-G formula based on Cr of 1.06). II, Severe P-EM. Admit c 1 wk h/o diarrhea in a setting of recent use clindamycin for LE's-found to be hypotensive and s/p fall -found to be CDIFF +  Hospital Course:  C. difficile of colitis: - On admission start her on Flagyl with no improvement was changed back by mouth and his diarrhea improved. His PPI was stopped.  Right hip pain due to pelvic fracture: It was deemed non-operative He will follow-up with Dr. Roda Shutters was an outpatient in 2 weeks.  Hypotension with a history of hypertension: His meds were held on admission due to hypotension he will be resuming as an outpatient.  Controlled  chronic atrial fibrillation: None anticoagulation was checked by score of 2 for unknown reason we'll continue amiodarone 200 mg a day and aspirin and metoprolol. He'll follow-up with his PCP as an outpatient and will discuss as an outpatient the resumption of anticoagulation.  History of multiple sclerosis: Truly stable no changes were made.  Hypothyroidism: Up with PCP as an outpatient currently on amiodarone.  Chronic anemia: Follow-up with primary care doctor as an outpatient.  Thrombus cytopenia: platelet dropped probably due to infectious etiology now returned to baseline.    Procedures:  none (i.e. Studies not automatically included, echos, thoracentesis, etc; not x-rays)  Consultations:  none  Discharge Exam: Filed Vitals:   10/04/14 0911  BP: 135/95  Pulse: 108  Temp:   Resp:     General: A&O x3 Cardiovascular: RRR Respiratory: good air movement CTA B/L  Discharge Instructions   Discharge Instructions    Diet - low sodium heart healthy    Complete by:  As directed      Increase activity slowly    Complete by:  As directed           Current Discharge Medication List    START taking these medications   Details  vancomycin (VANCOCIN) 50 mg/mL oral solution Take 2.5 mLs (125 mg total) by mouth 4 (four) times daily. Qty: 100 mL, Refills: 0      CONTINUE these medications which have NOT CHANGED   Details  acetaminophen (TYLENOL) 325 MG tablet Take 325 mg by mouth every 6 (six) hours as  needed for mild pain or moderate pain.    amiodarone (PACERONE) 200 MG tablet Take 200 mg by mouth daily.    aspirin 81 MG tablet Take 81 mg by mouth daily.    atorvastatin (LIPITOR) 80 MG tablet Take 80 mg by mouth daily.    Cholecalciferol (VITAMIN D3) 2000 UNITS TABS Take 1 tablet by mouth daily.    Dimethyl Fumarate (TECFIDERA) 240 MG CPDR Take 240 mg by mouth 2 (two) times daily.     gabapentin (NEURONTIN) 300 MG capsule Take 300 mg by mouth 3 (three) times  daily.    levothyroxine (SYNTHROID, LEVOTHROID) 125 MCG tablet Take 125 mcg by mouth daily before breakfast.    metoprolol (LOPRESSOR) 50 MG tablet Take 50 mg by mouth 2 (two) times daily.      STOP taking these medications     hydrochlorothiazide (HYDRODIURIL) 50 MG tablet      lisinopril-hydrochlorothiazide (PRINZIDE,ZESTORETIC) 20-12.5 MG per tablet      pantoprazole (PROTONIX) 20 MG tablet      ipratropium (ATROVENT) 0.06 % nasal spray        No Known Allergies Follow-up Information    Follow up with Cheral Almas, MD In 2 weeks.   Specialty:  Orthopedic Surgery   Contact information:   [address] [place] Sa[place] [address] [phone]        The results of significant diagnostics from this hospitalization (including imaging, microbiology, ancillary and laboratory) are listed below for reference.    Significant Diagnostic Studies: Dg Pelvis 1-2 Views  10/01/2014   CLINICAL DATA:  Fall with pelvic pain  EXAM: PELVIS - 1-2 VIEW  COMPARISON:  None.  FINDINGS: Pelvic ring is intact. Lucency is noted through the midportion of the pubic symphysis on the right which may represent undisplaced fracture. Correlation with physical exam is recommended. If necessary CT may be helpful for further evaluation. Mild degenerative changes of the hip joints are noted bilaterally. Degenerative changes of lumbar spine are seen as well.  IMPRESSION: Lucency within the pubic symphysis on the right which may represent undisplaced fracture. Correlation with the physical exam is recommended.   Electronically Signed   By: Alcide Clever M.D.   On: 10/01/2014 01:07   Ct Head Wo Contrast  10/01/2014   CLINICAL DATA:  Larey Seat this morning at home, hitting back of head. Anticoagulated.  EXAM: CT HEAD WITHOUT CONTRAST  TECHNIQUE: Contiguous axial images were obtained from the base of the skull through the vertex without intravenous contrast.  COMPARISON:  11/12/2013  FINDINGS: There is no  intracranial hemorrhage or extra-axial fluid collection. There is moderate generalized atrophy. There is white matter hypodensity consistent with chronic small vessel ischemic disease. There are lacunar infarctions in the basal ganglia bilaterally, chronic. No acute findings are evident. Calvarium and skullbase are intact.  IMPRESSION: Negative for acute intracranial traumatic injury. There is moderate generalized atrophy, chronic microvascular ischemic disease and old lacunar infarctions.   Electronically Signed   By: Ellery Plunk M.D.   On: 10/01/2014 00:39    Microbiology: Recent Results (from the past 240 hour(s))  Culture, blood (routine x 2)     Status: None (Preliminary result)   Collection Time: 09/30/14  1:00 PM  Result Value Ref Range Status   Specimen Description BLOOD LEFT HAND  Final   Special Requests BOTTLES DRAWN AEROBIC AND ANAEROBIC 5CC EACH  Final   Culture   Final    NO GROWTH 3 DAYS Performed at [facility]  Report Status PENDING  Incomplete  Culture, blood (routine x 2)     Status: None (Preliminary result)   Collection Time: 09/30/14  1:20 PM  Result Value Ref Range Status   Specimen Description BLOOD RIGHT FOREARM  Final   Special Requests BOTTLES DRAWN AEROBIC AND ANAEROBIC 5 CC EACH  Final   Culture   Final    NO GROWTH 3 DAYS Performed at [facility]    Report Status PENDING  Incomplete  Urine culture     Status: None   Collection Time: 09/30/14  3:05 PM  Result Value Ref Range Status   Specimen Description URINE, CLEAN CATCH  Final   Special Requests NONE  Final   Culture   Final    MULTIPLE SPECIES PRESENT, SUGGEST RECOLLECTION IF CLINICALLY INDICATED Performed at [facility]    Report Status 10/01/2014 FINAL  Final  Clostridium Difficile by PCR (not at [facility])     Status: Abnormal   Collection Time: 10/01/14  2:43 AM  Result Value Ref Range Status   C difficile by pcr POSITIVE (A) NEGATIVE Final    Comment: CRITICAL  RESULT CALLED TO, READ BACK BY AND VERIFIED WITH:  Clarene Critchley AT [phone] BY KPEELE      Labs: Basic Metabolic Panel:  Recent Labs Lab 09/30/14 1320 10/01/14 0117 10/02/14 0434  NA 139 139 140  K 3.6 3.1* 4.1  CL 105 104 107  CO2 26 25 25   GLUCOSE 76 81 83  BUN 33* 23* 17  CREATININE 1.58* 1.41* 1.06  CALCIUM 8.0* 8.1* 8.1*   Liver Function Tests:  Recent Labs Lab 09/30/14 1320 10/02/14 0434  AST 26 22  ALT 17 15*  ALKPHOS 52 50  BILITOT 1.7* 1.1  PROT 6.0* 5.4*  ALBUMIN 2.9* 2.6*   No results for input(s): LIPASE, AMYLASE in the last 168 hours. No results for input(s): AMMONIA in the last 168 hours. CBC:  Recent Labs Lab 09/30/14 1320 10/01/14 0117 10/02/14 0434 10/04/14 0437  WBC 13.3* 10.9* 7.6 11.8*  NEUTROABS 10.8* 8.0* 5.0  --   HGB 12.6* 12.9* 11.8* 13.0  HCT 38.9* 38.7* 36.3* 40.1  MCV 93.7 90.6 91.9 90.9  PLT 213 201 125* 229   Cardiac Enzymes:  Recent Labs Lab 10/01/14 0117  CKTOTAL 464*   BNP: BNP (last 3 results) No results for input(s): BNP in the last 8760 hours.  ProBNP (last 3 results) No results for input(s): PROBNP in the last 8760 hours.  CBG: No results for input(s): GLUCAP in the last 168 hours.     Signed:  Marinda Elk  [facility] 10/04/2014, 10:40 AM

## 2014-10-05 ENCOUNTER — Inpatient Hospital Stay (HOSPITAL_COMMUNITY)
Admission: AD | Admit: 2014-10-05 | Discharge: 2014-10-19 | DRG: 945 | Disposition: A | Discharge status: Home Health | Payer: Medicare Other | Source: Intra-hospital | Attending: Physical Medicine & Rehabilitation | Admitting: Physical Medicine & Rehabilitation

## 2014-10-05 DIAGNOSIS — K219 Gastro-esophageal reflux disease without esophagitis: Secondary | ICD-10-CM | POA: Diagnosis present

## 2014-10-05 DIAGNOSIS — L97529 Non-pressure chronic ulcer of other part of left foot with unspecified severity: Secondary | ICD-10-CM | POA: Diagnosis present

## 2014-10-05 DIAGNOSIS — Z9181 History of falling: Secondary | ICD-10-CM | POA: Diagnosis not present

## 2014-10-05 DIAGNOSIS — W010XXD Fall on same level from slipping, tripping and stumbling without subsequent striking against object, subsequent encounter: Secondary | ICD-10-CM | POA: Diagnosis present

## 2014-10-05 DIAGNOSIS — I10 Essential (primary) hypertension: Secondary | ICD-10-CM | POA: Diagnosis present

## 2014-10-05 DIAGNOSIS — E876 Hypokalemia: Secondary | ICD-10-CM | POA: Diagnosis present

## 2014-10-05 DIAGNOSIS — S32591A Other specified fracture of right pubis, initial encounter for closed fracture: Secondary | ICD-10-CM

## 2014-10-05 DIAGNOSIS — R5381 Other malaise: Secondary | ICD-10-CM | POA: Diagnosis not present

## 2014-10-05 DIAGNOSIS — E038 Other specified hypothyroidism: Secondary | ICD-10-CM | POA: Diagnosis not present

## 2014-10-05 DIAGNOSIS — A047 Enterocolitis due to Clostridium difficile: Secondary | ICD-10-CM | POA: Diagnosis present

## 2014-10-05 DIAGNOSIS — S32591S Other specified fracture of right pubis, sequela: Secondary | ICD-10-CM | POA: Diagnosis not present

## 2014-10-05 DIAGNOSIS — S32591D Other specified fracture of right pubis, subsequent encounter for fracture with routine healing: Secondary | ICD-10-CM | POA: Diagnosis not present

## 2014-10-05 DIAGNOSIS — R531 Weakness: Principal | ICD-10-CM | POA: Diagnosis present

## 2014-10-05 DIAGNOSIS — G35 Multiple sclerosis: Secondary | ICD-10-CM | POA: Diagnosis present

## 2014-10-05 DIAGNOSIS — E039 Hypothyroidism, unspecified: Secondary | ICD-10-CM | POA: Diagnosis present

## 2014-10-05 DIAGNOSIS — D72828 Other elevated white blood cell count: Secondary | ICD-10-CM | POA: Diagnosis present

## 2014-10-05 DIAGNOSIS — G8250 Quadriplegia, unspecified: Secondary | ICD-10-CM | POA: Diagnosis not present

## 2014-10-05 DIAGNOSIS — E780 Pure hypercholesterolemia: Secondary | ICD-10-CM | POA: Diagnosis present

## 2014-10-05 DIAGNOSIS — I4891 Unspecified atrial fibrillation: Secondary | ICD-10-CM | POA: Diagnosis present

## 2014-10-05 DIAGNOSIS — A0472 Enterocolitis due to Clostridium difficile, not specified as recurrent: Secondary | ICD-10-CM | POA: Diagnosis present

## 2014-10-05 LAB — CULTURE, BLOOD (ROUTINE X 2)
Culture: NO GROWTH
Culture: NO GROWTH

## 2014-10-05 MED ORDER — ATORVASTATIN CALCIUM 80 MG PO TABS
80.0000 mg | ORAL_TABLET | Freq: Every day | ORAL | Status: DC
  Administered 2014-10-06 – 2014-10-19 (×14): 80 mg via ORAL
  Filled 2014-10-05 (×18): qty 1

## 2014-10-05 MED ORDER — TRAMADOL HCL 50 MG PO TABS
50.0000 mg | ORAL_TABLET | Freq: Four times a day (QID) | ORAL | Status: DC | PRN
  Administered 2014-10-07 – 2014-10-11 (×4): 50 mg via ORAL
  Filled 2014-10-05 (×4): qty 1

## 2014-10-05 MED ORDER — GABAPENTIN 300 MG PO CAPS
300.0000 mg | ORAL_CAPSULE | Freq: Three times a day (TID) | ORAL | Status: DC
  Administered 2014-10-05 – 2014-10-19 (×41): 300 mg via ORAL
  Filled 2014-10-05 (×48): qty 1

## 2014-10-05 MED ORDER — IPRATROPIUM BROMIDE 0.06 % NA SOLN
2.0000 | Freq: Four times a day (QID) | NASAL | Status: DC
  Administered 2014-10-05 – 2014-10-17 (×28): 2 via NASAL
  Filled 2014-10-05: qty 15

## 2014-10-05 MED ORDER — ONDANSETRON HCL 4 MG/2ML IJ SOLN
4.0000 mg | Freq: Four times a day (QID) | INTRAMUSCULAR | Status: DC | PRN
Start: 2014-10-05 — End: 2014-10-19

## 2014-10-05 MED ORDER — ACETAMINOPHEN 325 MG PO TABS: 325.0000 mg | ORAL_TABLET | ORAL | Status: DC | PRN

## 2014-10-05 MED ORDER — VITAMIN D3 25 MCG (1000 UNIT) PO TABS
2000.0000 [IU] | ORAL_TABLET | Freq: Every day | ORAL | Status: DC
  Administered 2014-10-06 – 2014-10-15 (×10): 2000 [IU] via ORAL
  Administered 2014-10-16: 1000 [IU] via ORAL
  Administered 2014-10-17 – 2014-10-19 (×3): 2000 [IU] via ORAL
  Filled 2014-10-05 (×18): qty 2

## 2014-10-05 MED ORDER — ENOXAPARIN SODIUM 40 MG/0.4ML ~~LOC~~ SOLN: 40.0000 mg | SUBCUTANEOUS | Status: DC

## 2014-10-05 MED ORDER — ONDANSETRON HCL 4 MG PO TABS: 4.0000 mg | ORAL_TABLET | Freq: Four times a day (QID) | ORAL | Status: DC | PRN

## 2014-10-05 MED ORDER — AMIODARONE HCL 200 MG PO TABS
200.0000 mg | ORAL_TABLET | Freq: Every day | ORAL | Status: DC
  Administered 2014-10-06 – 2014-10-19 (×14): 200 mg via ORAL
  Filled 2014-10-05 (×18): qty 1

## 2014-10-05 MED ORDER — ALUM & MAG HYDROXIDE-SIMETH 200-200-20 MG/5ML PO SUSP: 30.0000 mL | ORAL | Status: DC | PRN

## 2014-10-05 MED ORDER — METOPROLOL TARTRATE 50 MG PO TABS
50.0000 mg | ORAL_TABLET | Freq: Two times a day (BID) | ORAL | Status: DC
  Administered 2014-10-05 – 2014-10-19 (×27): 50 mg via ORAL
  Filled 2014-10-05 (×34): qty 1

## 2014-10-05 MED ORDER — TRAZODONE HCL 50 MG PO TABS
25.0000 mg | ORAL_TABLET | Freq: Every evening | ORAL | Status: DC | PRN
  Administered 2014-10-11 – 2014-10-13 (×3): 50 mg via ORAL
  Filled 2014-10-05 (×3): qty 1

## 2014-10-05 MED ORDER — ADULT MULTIVITAMIN W/MINERALS CH
1.0000 | ORAL_TABLET | Freq: Every day | ORAL | Status: DC
  Administered 2014-10-06 – 2014-10-19 (×14): 1 via ORAL
  Filled 2014-10-05 (×18): qty 1

## 2014-10-05 MED ORDER — VANCOMYCIN 50 MG/ML ORAL SOLUTION
125.0000 mg | Freq: Four times a day (QID) | ORAL | Status: AC
  Administered 2014-10-05 – 2014-10-15 (×39): 125 mg via ORAL
  Filled 2014-10-05 (×40): qty 2.5

## 2014-10-05 MED ORDER — ENOXAPARIN SODIUM 40 MG/0.4ML ~~LOC~~ SOLN
40.0000 mg | SUBCUTANEOUS | Status: DC
  Administered 2014-10-06 – 2014-10-19 (×14): 40 mg via SUBCUTANEOUS
  Filled 2014-10-05 (×15): qty 0.4

## 2014-10-05 MED ORDER — ASPIRIN 81 MG PO CHEW
81.0000 mg | CHEWABLE_TABLET | Freq: Every day | ORAL | Status: DC
  Administered 2014-10-06 – 2014-10-19 (×14): 81 mg via ORAL
  Filled 2014-10-05 (×14): qty 1

## 2014-10-05 MED ORDER — LEVOTHYROXINE SODIUM 125 MCG PO TABS
125.0000 ug | ORAL_TABLET | Freq: Every day | ORAL | Status: DC
  Administered 2014-10-06 – 2014-10-19 (×14): 125 ug via ORAL
  Filled 2014-10-05 (×16): qty 1

## 2014-10-05 MED ORDER — ENSURE ENLIVE PO LIQD
237.0000 mL | Freq: Two times a day (BID) | ORAL | Status: DC
  Administered 2014-10-06 – 2014-10-13 (×13): 237 mL via ORAL

## 2014-10-05 MED ORDER — DIMETHYL FUMARATE 240 MG PO CPDR
240.0000 mg | DELAYED_RELEASE_CAPSULE | Freq: Two times a day (BID) | ORAL | Status: DC
  Administered 2014-10-05 – 2014-10-11 (×13): 240 mg via ORAL
  Filled 2014-10-05 (×12): qty 1

## 2014-10-05 NOTE — PMR Pre-admission (Signed)
PMR Admission Coordinator Pre-Admission Assessment  Patient: Martin Powell is an 69 y.o., male MRN: [id number] DOB: [birth year] Height: 6\' 3"  (190.5 cm) Weight: 106.278 kg (234 lb 4.8 oz)              Insurance Information  PRIMARY: Medicare A only      Policy#: [id number]      Subscriber: self Pre-Cert#: verified in [employer]: retired [employer]. Date: A: 11-01-07     Deduct: $1288      Out of Pocket Max: none      Life Max: unlimited CIR: 100%      SNF: 100% days 1-20; 80% days 21-100 (100 days max) Outpatient: no coverage     Co-Pay:  Home Health: 100%      Co-Pay: none DME: 80/20%     Co-Pay: none Providers: pt's preference  Note: pt has VA coverage but this will not be billed as we are not in network with VA. Pt also had some initial questions about applying for Medicaid. Rehab social worker to follow up on this.  Emergency Contact Information Contact Information    Name Relation Home Work Mobile   Rudolph Sister   [phone]     Current Medical History  Patient Admitting Diagnosis: pelvic fracture, deconditioning in MS patient related to C diff enteritis  History of Present Illness: Martin Powell is a 69 y.o. male with MS and chronic right sided weakness, chronic wounds who after recent diarrhea fell on 6/29 and was found hypotensive in his house by EMS. He had pain in his right pelvis/thigh after fall. He was found to have c diff enteritis treated with po vancomycin as well as a right sided pubic symphysis racture. Conservative care was recommended. This patient failed to progress and rehab was consulted for recommendations.   Past Medical History  Past Medical History  Diagnosis Date  . Hypertension   . GERD (gastroesophageal reflux disease)   . Hypercholesteremia   . Thyroid activity decreased   . Multiple sclerosis   . Murmur, heart     Family History  family history is negative for CAD.  Prior Rehab/Hospitalizations:  Has the patient had major surgery  during 100 days prior to admission? No  Current Medications   Current facility-administered medications:  .  acetaminophen (TYLENOL) tablet 650 mg, 650 mg, Oral, Q6H PRN **OR** acetaminophen (TYLENOL) suppository 650 mg, 650 mg, Rectal, Q6H PRN, Eduard Clos, MD .  amiodarone (PACERONE) tablet 200 mg, 200 mg, Oral, Daily, Eduard Clos, MD, 200 mg at 10/05/14 1003 .  aspirin chewable tablet 81 mg, 81 mg, Oral, Daily, Eduard Clos, MD, 81 mg at 10/05/14 1002 .  atorvastatin (LIPITOR) tablet 80 mg, 80 mg, Oral, Daily, Eduard Clos, MD, 80 mg at 10/05/14 1003 .  cholecalciferol (VITAMIN D) tablet 2,000 Units, 2,000 Units, Oral, Daily, Calvert Cantor, MD, 2,000 Units at 10/05/14 1003 .  Dimethyl Fumarate CPDR 240 mg, 240 mg, Oral, BID, Eduard Clos, MD, 240 mg at 10/05/14 1042 .  enoxaparin (LOVENOX) injection 40 mg, 40 mg, Subcutaneous, Q24H, Eduard Clos, MD, 40 mg at 10/05/14 1003 .  feeding supplement (ENSURE ENLIVE) (ENSURE ENLIVE) liquid 237 mL, 237 mL, Oral, BID BM, Eduard Clos, MD, 237 mL at 10/05/14 1000 .  gabapentin (NEURONTIN) capsule 300 mg, 300 mg, Oral, TID, Eduard Clos, MD, 300 mg at 10/05/14 1003 .  ipratropium (ATROVENT) 0.06 % nasal spray 2 spray, 2 spray, Each  Nare, QID, Eduard Clos, MD, 2 spray at 10/05/14 1003 .  levothyroxine (SYNTHROID, LEVOTHROID) tablet 125 mcg, 125 mcg, Oral, QAC breakfast, Eduard Clos, MD, 125 mcg at 10/05/14 [phone] .  metoprolol (LOPRESSOR) tablet 50 mg, 50 mg, Oral, BID, Calvert Cantor, MD, 50 mg at 10/05/14 1003 .  multivitamin with minerals tablet 1 tablet, 1 tablet, Oral, Daily, Jenifer A Williams, RD, 1 tablet at 10/05/14 1003 .  ondansetron (ZOFRAN) tablet 4 mg, 4 mg, Oral, Q6H PRN **OR** ondansetron (ZOFRAN) injection 4 mg, 4 mg, Intravenous, Q6H PRN, Eduard Clos, MD .  potassium chloride SA (K-DUR,KLOR-CON) CR tablet 40 mEq, 40 mEq, Oral, BID, Rhetta Mura, MD, 40 mEq  at 10/05/14 1002 .  traMADol (ULTRAM) tablet 50 mg, 50 mg, Oral, Q6H PRN, Eduard Clos, MD, 50 mg at 10/04/14 2126 .  vancomycin (VANCOCIN) 50 mg/mL oral solution 125 mg, 125 mg, Oral, QID, Rhetta Mura, MD, 125 mg at 10/05/14 1003  Patients Current Diet: Diet Heart Room service appropriate?: Yes; Fluid consistency:: Thin Diet - low sodium heart healthy  Precautions / Restrictions Precautions Precautions: Fall Restrictions Weight Bearing Restrictions: No RLE Weight Bearing: Weight bearing as tolerated   Has the patient had 2 or more falls or a fall with injury in the past year? Yes (pt sustained a pelvic fx during the fall PTA)  Prior Activity Level Limited Community (1-2x/wk): Pt got out on limited errands with his sister (she does the driving). He goes to the grocery with her and either uses his walker in the store or waits in the car. He enjoys his houseplants and sitting in the dayroom of his apartment complex.  Home Assistive Devices / Equipment Home Assistive Devices/Equipment: Environmental consultant (specify type), Grab bars in shower, Shower chair with back Home Equipment: Walker - 2 wheels, Shower seat, Grab bars - tub/shower  Prior Device Use: Indicate devices/aids used by the patient prior to current illness, exacerbation or injury? Walker Radiographer, therapeutic)  Prior Functional Level Prior Function Level of Independence: Independent with assistive device(s), Needs assistance Gait / Transfers Assistance Needed: Uses RW for gait ADL's / Homemaking Assistance Needed: Sister assists with housekeeping and errands (patient does not drive).  Patient reports he has someone in the house when he showers.  Self Care: Did the patient need help bathing, dressing, using the toilet or eating?  Independent but sister states that pt would benefit from some help in the area of bathing and dressing.   Indoor Mobility: Did the patient need assistance with walking from room to room (with or  without device)? Independent using his rolling walker  Stairs: Did the patient need assistance with internal or external stairs (with or without device)? Needed some help from his sister  Functional Cognition: Did the patient need help planning regular tasks such as shopping or remembering to take medications? Needed some help (Pt's sister helped to organize his pill box)  Current Functional Level Cognition  Overall Cognitive Status: Within Functional Limits for tasks assessed Safety/Judgement: Decreased awareness of safety General Comments: patient with extremely wide base of support and this PT felt patient was about to fall and patient reports he is "fine" and "will make it".      Extremity Assessment (includes Sensation/Coordination)  Upper Extremity Assessment: Generalized weakness  Lower Extremity Assessment: Generalized weakness    ADLs   not assessed, anticipate needs    Mobility  Overal bed mobility: Needs Assistance Bed Mobility: Rolling, Sidelying to Sit Rolling: Modified independent (Device/Increase  time) Sidelying to sit: Modified independent (Device/Increase time) Sit to supine: Min guard General bed mobility comments: HOB up 25* (pt stated this is set up he uses at home with hospital bed), mod I using rail to push up    Transfers  Overall transfer level: Needs assistance Equipment used: Rolling walker (2 wheeled) Transfers: Sit to/from Stand Sit to Stand: Min assist General transfer comment: Verbal cues for hand placement and technique.  Assist to power up to standing and for balance. Sit to stand x 3 trials.     Ambulation / Gait / Stairs / Wheelchair Mobility  Ambulation/Gait Ambulation/Gait assistance: Hydrographic surveyor (Feet): 60 Feet (30' x 2 with seated rest break) Assistive device: Rolling walker (2 wheeled) General Gait Details: cues for increasing step length, RLE tends to drag, and for posture due to forward flexed neck and elevated  shoulders Gait Pattern/deviations: Decreased step length - right, Decreased step length - left, Wide base of support, Shuffle, Trunk flexed Gait velocity: Decreased Gait velocity interpretation: Below normal speed for age/gender    Posture / Balance Dynamic Sitting Balance Sitting balance - Comments: performed reaching forward and to R and L over BOS with BUEs without loss of balance, no UE support Balance Overall balance assessment: Needs assistance Sitting-balance support: Feet supported Sitting balance-Leahy Scale: Good Sitting balance - Comments: performed reaching forward and to R and L over BOS with BUEs without loss of balance, no UE support Standing balance support: Bilateral upper extremity supported Standing balance-Leahy Scale: Poor    Special needs/care consideration BiPAP/CPAP no CPM no  Continuous Drip IV no  Dialysis no         Life Vest no  Oxygen no  Special Bed no  Trach Size no  Wound Vac (area) no       Skin - bilateral LE ulcers, ACE compression wrapped                               Bowel mgmt: last BM on 10-04-14 Bladder mgmt: using urinal Diabetic mgmt no   Previous Home Environment Living Arrangements: Alone Available Help at Discharge: Family, Available PRN/intermittently Type of Home: Apartment Home Layout: One level Home Access: Level entry Bathroom Shower/Tub: Engineer, manufacturing systems: Standard Home Care Services: Yes Type of Home Care Services: Home RN, Home PT, Other (Comment) (speech therapist)  Discharge Living Setting Plans for Discharge Living Setting: Patient's home, Apartment Type of Home at Discharge: Apartment Discharge Home Layout: One level Discharge Home Access: Level entry Discharge Bathroom Shower/Tub: Tub/shower unit Discharge Bathroom Toilet: Standard Does the patient have any problems obtaining your medications?: No (pt's sister does the driving)  Social/Family/Support Systems Patient Roles: Other (Comment) (retired  from [employer] ([place] Contact Information: sister Roosvelt Harps is primary contact Anticipated Caregiver: sister Annice Pih and home health aides Anticipated Caregiver's Contact Information: see above Ability/Limitations of Caregiver: sister is only able to provide intermittent supervision and checks on him daily. Home health aides come every other day. Caregiver Availability: Intermittent Discharge Plan Discussed with Primary Caregiver: Yes (discussed possible DC plans with pt/sister) Is Caregiver In Agreement with Plan?: Yes Does Caregiver/Family have Issues with Lodging/Transportation while Pt is in Rehab?: No   Note: Sister is in support of pt coming to CIR and states that she does check on pt daily. Sister cannot provide 24 hr supervision. Rehab MD has projected that pt may need 24-7 supervision and this was explained to  both pt/sister. Pt is hoping to return home with intermittent support from his sister and home health aides. The possibility of assisted living was also discussed with pt/sister and rehab social worker will follow up on this. Pt has financial concerns and doesn't want to lose his subsidized apartment. Pt is very determined to return home to his independent apartment.  Goals/Additional Needs Patient/Family Goal for Rehab: Supervision with PT/OT; NA for SLP Expected length of stay: 7 days Cultural Considerations: none Dietary Needs: heart healthy, thin liquids Equipment Needs: to be determined Pt/Family Agrees to Admission and willing to participate:  (spoke with pt and his sister (by phone)) Program Orientation Provided & Reviewed with Pt/Caregiver Including Roles  & Responsibilities: Yes   Decrease burden of Care through IP rehab admission: NA  Possible need for SNF placement upon discharge: this is a possibility, depending on pt's progress and how much supervision he will need at the end of CIR stay. Family/home health cannot provide 24-7 supervision. This was discussed with  both pt and his sister. Pt is very determined to return home to his independent apartment.  Patient Condition: This patient's condition remains as documented in the consult dated 10-04-14, in which the Rehabilitation Physician determined and documented that the patient's condition is appropriate for intensive rehabilitative care in an inpatient rehabilitation facility. Will admit to inpatient rehab today.  Preadmission Screen Completed By:  Juliann Mule, PT, 10/05/2014 11:50 AM ______________________________________________________________________   Discussed status with Dr. Riley Kill on 10-05-14 at 1150 and received telephone approval for admission today.  Admission Coordinator:  Vardaan Depascale, PT, time1150/Date 10-05-14

## 2014-10-05 NOTE — Progress Notes (Signed)
Orthopedic Tech Progress Note Patient Details:  Martin Powell 09/11/1945 [id number]  Ortho Devices Type of Ortho Device: Roland Rack boot Ortho Device/Splint Location: bilateral Ortho Device/Splint Interventions: Application   Nikki Dom 10/05/2014, 4:41 PM

## 2014-10-05 NOTE — Progress Notes (Signed)
[facility] PROGRESS NOTE  Assessment/Plan: C. difficile of colitis: On admission start her on Flagyl with no improvement was changed back by mouth and his diarrhea improved. His PPI was stopped. Now on vanco.  Right hip pain due to pelvic fracture: It was deemed non-operative He will follow-up with Dr. Roda Shutters was an outpatient in 2 weeks.  Hypotension with a history of hypertension: Held on admission will be resuming as an outpatient.  Controlled chronic atrial fibrillation: Not on anticoagulation  for unknown reason we'll continue amiodarone 200 mg a day and aspirin and metoprolol. He'll follow-up with his PCP as an outpatient and will discuss as an outpatient the resumption of anticoagulation.  History of multiple sclerosis: Truly stable no changes were made.   Code Status: full Family Communication: none  Disposition Plan: inpatient   Consultants:    Procedures:    Antibiotics:  vanc  HPI/Subjective: No complains  Objective: Filed Vitals:   10/04/14 1500 10/04/14 2100 10/05/14 0535 10/05/14 1003  BP: 112/70 122/86 134/94 125/83  Pulse: 109 102 109 109  Temp: 98.5 F (36.9 C) 98.3 F (36.8 C) 98.9 F (37.2 C)   TempSrc: Oral Oral Oral   Resp:   16   Height:      Weight:   106.278 kg (234 lb 4.8 oz)   SpO2: 97% 96% 96%     Intake/Output Summary (Last 24 hours) at 10/05/14 1055 Last data filed at 10/05/14 0945  Gross per 24 hour  Intake    240 ml  Output    175 ml  Net     65 ml   Filed Weights   10/02/14 0358 10/04/14 0501 10/05/14 0535  Weight: 109.256 kg (240 lb 13.9 oz) 109.355 kg (241 lb 1.3 oz) 106.278 kg (234 lb 4.8 oz)    Exam:  General: Alert, awake, oriented x3, in no acute distress.  HEENT: No bruits, no goiter.  Heart: Regular rate and rhythm. Lungs: Good air movement, clear Abdomen: Soft, nontender, nondistended, positive bowel sounds.  Neuro: Grossly intact, nonfocal.   Data Reviewed: Basic Metabolic  Panel:  Recent Labs Lab 09/30/14 1320 10/01/14 0117 10/02/14 0434  NA 139 139 140  K 3.6 3.1* 4.1  CL 105 104 107  CO2 GLUCOSE 76 81 83  BUN 33* 23* 17  CREATININE 1.58* 1.41* 1.06  CALCIUM 8.0* 8.1* 8.1*   Liver Function Tests:  Recent Labs Lab 09/30/14 1320 10/02/14 0434  AST 26 22  ALT 17 15*  ALKPHOS 52 50  BILITOT 1.7* 1.1  PROT 6.0* 5.4*  ALBUMIN 2.9* 2.6*   No results for input(s): LIPASE, AMYLASE in the last 168 hours. No results for input(s): AMMONIA in the last 168 hours. CBC:  Recent Labs Lab 09/30/14 1320 10/01/14 0117 10/02/14 0434 10/04/14 0437  WBC 13.3* 10.9* 7.6 11.8*  NEUTROABS 10.8* 8.0* 5.0  --   HGB 12.6* 12.9* 11.8* 13.0  HCT 38.9* 38.7* 36.3* 40.1  MCV 93.7 90.6 91.9 90.9  PLT 213 201 125* 229   Cardiac Enzymes:  Recent Labs Lab 10/01/14 0117  CKTOTAL 464*   BNP (last 3 results) No results for input(s): BNP in the last 8760 hours.  ProBNP (last 3 results) No results for input(s): PROBNP in the last 8760 hours.  CBG: No results for input(s): GLUCAP in the last 168 hours.  Recent Results (from the past 240 hour(s))  Culture, blood (routine x 2)     Status: None (Preliminary result)  Collection Time: 09/30/14  1:00 PM  Result Value Ref Range Status   Specimen Description BLOOD LEFT HAND  Final   Special Requests BOTTLES DRAWN AEROBIC AND ANAEROBIC 5CC EACH  Final   Culture   Final    NO GROWTH 4 DAYS Performed at [facility]    Report Status PENDING  Incomplete  Culture, blood (routine x 2)     Status: None (Preliminary result)   Collection Time: 09/30/14  1:20 PM  Result Value Ref Range Status   Specimen Description BLOOD RIGHT FOREARM  Final   Special Requests BOTTLES DRAWN AEROBIC AND ANAEROBIC 5 CC EACH  Final   Culture   Final    NO GROWTH 4 DAYS Performed at [facility]    Report Status PENDING  Incomplete  Urine culture     Status: None   Collection Time: 09/30/14  3:05 PM   Result Value Ref Range Status   Specimen Description URINE, CLEAN CATCH  Final   Special Requests NONE  Final   Culture   Final    MULTIPLE SPECIES PRESENT, SUGGEST RECOLLECTION IF CLINICALLY INDICATED Performed at [facility]    Report Status 10/01/2014 FINAL  Final  Clostridium Difficile by PCR (not at [facility])     Status: Abnormal   Collection Time: 10/01/14  2:43 AM  Result Value Ref Range Status   C difficile by pcr POSITIVE (A) NEGATIVE Final    Comment: CRITICAL RESULT CALLED TO, READ BACK BY AND VERIFIED WITH:  Clarene Critchley AT [id number] BY KPEELE      Studies: No results found.  Scheduled Meds: . amiodarone  200 mg Oral Daily  . aspirin  81 mg Oral Daily  . atorvastatin  80 mg Oral Daily  . cholecalciferol  2,000 Units Oral Daily  . Dimethyl Fumarate  240 mg Oral BID  . enoxaparin (LOVENOX) injection  40 mg Subcutaneous Q24H  . feeding supplement (ENSURE ENLIVE)  237 mL Oral BID BM  . gabapentin  300 mg Oral TID  . ipratropium  2 spray Each Nare QID  . levothyroxine  125 mcg Oral QAC breakfast  . metoprolol  50 mg Oral BID  . multivitamin with minerals  1 tablet Oral Daily  . potassium chloride  40 mEq Oral BID  . vancomycin  125 mg Oral QID   Continuous Infusions:   Time Spent: 15 min   Marinda Elk  [facility] Pager [phone]. If 7PM-7AM, please contact night-coverage at [email or web address], password [facility] 10/05/2014, 10:55 AM  LOS: 5 days

## 2014-10-05 NOTE — H&P (Signed)
Physical Medicine and Rehabilitation Admission H&P    Chief Complaint  Patient presents with  . Weakness and pelvic pain    HPI: Martin Powell is a 69 y.o. male with MS and chronic right sided weakness, chronic BLE wounds (treated recently with clindamycin) who was admitted 09/29/14 due to recurrent fall after multiple episodes of diarrhea. He was  found hypotensive in his house by EMS and required multiple fluid boluses for BP stabilization. He has complaints of right pelvis/thigh after fall and Xrays with question of nondisplaced right pubic symphysis fracture. He was found to have c diff enteritis with reactive leucocytosis and hypokalemia. Dr. Roda Shutters recommended conservative care  for pelvic fracture and he was started on po vancomycin with recommendations for two week treatment to end on 07/14.  Unna boots placed on BLE ulcers and to be changed 2 x week per WOC. This patient failed to progress and CIR was recommended  By Rehab team.    Review of Systems  HENT: Negative for hearing loss.   Eyes: Negative for blurred vision and double vision.  Respiratory: Negative for cough and shortness of breath.   Cardiovascular: Negative for chest pain and palpitations.  Gastrointestinal: Negative for heartburn, nausea, abdominal pain and diarrhea.  Genitourinary: Negative for urgency and frequency.  Musculoskeletal: Positive for falls. Negative for myalgias, back pain and joint pain.  Neurological: Positive for dizziness and focal weakness. Negative for headaches.  Psychiatric/Behavioral: The patient is not nervous/anxious and does not have insomnia.       Past Medical History  Diagnosis Date  . Hypertension   . GERD (gastroesophageal reflux disease)   . Hypercholesteremia   . Thyroid activity decreased   . Multiple sclerosis   . Murmur, heart      Past Surgical History  Procedure Laterality Date  . Appendectomy      Family History  Problem Relation Age of Onset  . CAD Neg Hx       Social History:  Lives alone. Retired Occupational hygienist. Independent for mobility with RW. Sister helps with housework and was undergoing HH therapy.   reports that he has never smoked. He does not have any smokeless tobacco history on file. He reports that he does not drink alcohol or use illicit drugs.    Allergies: No Known Allergies    Medications Prior to Admission  Medication Sig Dispense Refill  . acetaminophen (TYLENOL) 325 MG tablet Take 325 mg by mouth every 6 (six) hours as needed for mild pain or moderate pain.    Marland Kitchen amiodarone (PACERONE) 200 MG tablet Take 200 mg by mouth daily.    Marland Kitchen aspirin 81 MG tablet Take 81 mg by mouth daily.    Marland Kitchen atorvastatin (LIPITOR) 80 MG tablet Take 80 mg by mouth daily.    . Cholecalciferol (VITAMIN D3) 2000 UNITS TABS Take 1 tablet by mouth daily.    . Dimethyl Fumarate (TECFIDERA) 240 MG CPDR Take 240 mg by mouth 2 (two) times daily.     Marland Kitchen gabapentin (NEURONTIN) 300 MG capsule Take 300 mg by mouth 3 (three) times daily.    . hydrochlorothiazide (HYDRODIURIL) 50 MG tablet Take 50 mg by mouth daily.    Marland Kitchen levothyroxine (SYNTHROID, LEVOTHROID) 125 MCG tablet Take 125 mcg by mouth daily before breakfast.    . lisinopril-hydrochlorothiazide (PRINZIDE,ZESTORETIC) 20-12.5 MG per tablet Take 1 tablet by mouth daily.    . metoprolol (LOPRESSOR) 50 MG tablet Take 50 mg by mouth 2 (two) times daily.    Marland Kitchen  pantoprazole (PROTONIX) 20 MG tablet Take 20 mg by mouth daily.      Home: Home Living Family/patient expects to be discharged to:: Private residence Living Arrangements: Alone Available Help at Discharge: Family, Available PRN/intermittently Type of Home: Apartment Home Access: Level entry Home Layout: One level Home Equipment: Environmental consultant - 2 wheels, Shower seat, Grab bars - tub/shower   Functional History: Prior Function Level of Independence: Independent with assistive device(s), Needs assistance Gait / Transfers Assistance Needed: Uses RW for  gait ADL's / Homemaking Assistance Needed: Sister assists with housekeeping and errands (patient does not drive).  Patient reports he has someone in the house when he showers.  Functional Status:  Mobility: Bed Mobility Overal bed mobility: Needs Assistance Bed Mobility: Rolling, Sidelying to Sit Rolling: Supervision Sidelying to sit: Max assist Sit to supine: Min guard General bed mobility comments: patient unable to bring shoulders off bed and find midline on side of bed.  required max assist to reach upright and balance on edge of bed. Transfers Overall transfer level: Needs assistance Equipment used: Rolling walker (2 wheeled) Transfers: Sit to/from Stand Sit to Stand: Mod assist General transfer comment: patient required assist to power up and max verbal cues for sequencing Ambulation/Gait Ambulation/Gait assistance: Mod assist, +2 physical assistance Ambulation Distance (Feet): 10 Feet Assistive device: Rolling walker (2 wheeled) General Gait Details: patient with severe lean to right upon standing, requiring max assist to maintain upright.  On second attempt at standing/gait, patient better able to reach upright, however, after ambulating 10 feel standing with extremely wide base of support, feel slipping, leaning severly to right and requiring max assit to maintin upright.  During this time, patient reporting "i am fine" and "I can make it" when in this PT's judgement, pattient about to slide to floor.   Gait Pattern/deviations: Step-to pattern, Decreased step length - right, Shuffle, Wide base of support Gait velocity: Decreased Gait velocity interpretation: <1.8 ft/sec, indicative of risk for recurrent falls    ADL:    Cognition: Cognition Overall Cognitive Status: Impaired/Different from baseline Cognition Arousal/Alertness: Awake/alert Behavior During Therapy: WFL for tasks assessed/performed Overall Cognitive Status: Impaired/Different from baseline Area of  Impairment: Safety/judgement Safety/Judgement: Decreased awareness of safety General Comments: patient with extremely wide base of support and this PT felt patient was about to fall and patient reports he is "fine" and "will make it".     Blood pressure 125/83, pulse 109, temperature 98.9 F (37.2 C), temperature source Oral, resp. rate 16, height  (1.905 m), weight 106.278 kg (234 lb 4.8 oz), SpO2 96 %. Physical Exam  Nursing note and vitals reviewed. Constitutional: He is oriented to person, place, and time. He appears well-developed and well-nourished.  HENT:  Head: Normocephalic and atraumatic.  Right Ear: External ear normal.  Left Ear: External ear normal.  Eyes: Conjunctivae are normal. Pupils are equal, round, and reactive to light. Right eye exhibits no discharge. Left eye exhibits no discharge.  Neck: Normal range of motion. Neck supple. No tracheal deviation present. No thyromegaly present.  Cardiovascular: Normal rate.  An irregularly irregular rhythm present. Exam reveals no friction rub.   No murmur heard. Respiratory: No respiratory distress. He has no wheezes. He has no rales.  GI: Soft. Bowel sounds are normal. He exhibits no distension. There is no tenderness. There is no rebound and no guarding.  Musculoskeletal: He exhibits edema (1+ pedal edema bilaterally).  Neurological: He is alert and oriented to person, place, and time.  Pt with reasonable  insight and awareness. UE's 4/5 delt, tricep, bicep, wrist, HI. LE's 2HF, 3-KE and 4- ankle df/pf. Mild sensory deficits to LT/PP in both feet. DTR's 2+ to 3+  Skin: Skin is warm and dry.  Unna boots on BLE  Psychiatric: He has a normal mood and affect. His behavior is normal.    Results for orders placed or performed during the hospital encounter of 09/30/14 (from the past 48 hour(s))  CBC     Status: Abnormal   Collection Time: 10/04/14  4:37 AM  Result Value Ref Range   WBC 11.8 (H) 4.0 - 10.5 K/uL   RBC 4.41 4.22  - 5.81 MIL/uL   Hemoglobin 13.0 13.0 - 17.0 g/dL   HCT 08.6 57.8 - 46.9 %   MCV 90.9 78.0 - 100.0 fL   MCH 29.5 26.0 - 34.0 pg   MCHC 32.4 30.0 - 36.0 g/dL   RDW 62.9 52.8 - 41.3 %   Platelets 229 150 - 400 K/uL   No results found.     Medical Problem List and Plan: 1. Functional deficits secondary to pelvic fracture, deconditioning in MS patient related to C diff enteritis 2.  DVT Prophylaxis/Anticoagulation: Pharmaceutical: Lovenox 3. Pain Management: Tramadol prn effective.  4. Mood: LCSW to follow for evaluation and support.  5. Neuropsych: This patient is capable of making decisions on his own behalf. 6. Stasis ulcers/Skin/Wound Care: Change Unna boots Tue/Thur 7. Fluids/Electrolytes/Nutrition: Monitor I/O. Continue supplements between meals.  Check lytes in am. 8. C diff colitis: Vancomycin D # 5/14 9. Reactive leucocytosis: 10 MS with chronic right sided weakness: On tecfidera 11. HTN: Monitor BP bid. On me 12. A fib: HR controlled. On amiodarone, BB and low dose ASA.     Post Admission Physician Evaluation: 1. Functional deficits secondary  to pelvic fracture, deconditioning in MS patient related to C diff enteritis 2. Patient is admitted to receive collaborative, interdisciplinary care between the physiatrist, rehab nursing staff, and therapy team. 3. Patient's level of medical complexity and substantial therapy needs in context of that medical necessity cannot be provided at a lesser intensity of care such as a SNF. 4. Patient has experienced substantial functional loss from his/her baseline which was documented above under the "Functional History" and "Functional Status" headings.  Judging by the patient's diagnosis, physical exam, and functional history, the patient has potential for functional progress which will result in measurable gains while on inpatient rehab.  These gains will be of substantial and practical use upon discharge  in facilitating mobility and  self-care at the household level. 5. Physiatrist will provide 24 hour management of medical needs as well as oversight of the therapy plan/treatment and provide guidance as appropriate regarding the interaction of the two. 6. 24 hour rehab nursing will assist with bladder management, bowel management, safety, skin/wound care, disease management, medication administration, pain management and patient education  and help integrate therapy concepts, techniques,education, etc. 7. PT will assess and treat for/with: Lower extremity strength, range of motion, stamina, balance, functional mobility, safety, adaptive techniques and equipment, pain mgt, NMR, pet education.   Goals are: mod I. 8. OT will assess and treat for/with: ADL's, functional mobility, safety, upper extremity strength, adaptive techniques and equipment, NMR, balance, ego support, community reintegration.   Goals are: mod I. Therapy may not yet proceed with showering this patient. 9. SLP will assess and treat for/with: n/a.  Goals are: n/a. 10. Case Management and Social Worker will assess and treat for psychological issues and discharge  planning. 11. Team conference will be held weekly to assess progress toward goals and to determine barriers to discharge. 12. Patient will receive at least 3 hours of therapy per day at least 5 days per week. 13. ELOS: 7-10 days       14. Prognosis:  good     Ranelle Oyster, MD, [facility] Physical Medicine & Rehabilitation 10/05/2014   10/05/2014

## 2014-10-05 NOTE — Progress Notes (Signed)
Physical Therapy Treatment Patient Details Name: Martin Powell MRN: [id number] DOB: 09/25/1945 Today's Date: 10/05/2014    History of Present Illness Patient is a 69 yo male admitted 09/30/14 following a fall.  Patient with dehydration, hypotension, and diarrhea.  Patient with nondisplaced pubic fx (WBAT).  PMH:  Chronic bil LE wounds - Unna boots, MS, HTN, Afib, RLE weakness    PT Comments    Significant improvement with balance and walking today. Pt walked 30' x 2  With min A and RW. Performed seated dynamic balance activities without LOB.   Follow Up Recommendations  CIR;Supervision/Assistance - 24 hour     Equipment Recommendations  None recommended by PT    Recommendations for Other Services Rehab consult     Precautions / Restrictions Precautions Precautions: Fall Restrictions Weight Bearing Restrictions: No RLE Weight Bearing: Weight bearing as tolerated    Mobility  Bed Mobility Overal bed mobility: Needs Assistance Bed Mobility: Rolling;Sidelying to Sit Rolling: Modified independent (Device/Increase time) Sidelying to sit: Modified independent (Device/Increase time)       General bed mobility comments: HOB up 25* (pt stated this is set up he uses at home with hospital bed), mod I using rail to push up  Transfers Overall transfer level: Needs assistance Equipment used: Rolling walker (2 wheeled) Transfers: Sit to/from Stand Sit to Stand: Min assist         General transfer comment: Verbal cues for hand placement and technique.  Assist to power up to standing and for balance. Sit to stand x 3 trials.   Ambulation/Gait Ambulation/Gait assistance: Min guard Ambulation Distance (Feet): 60 Feet (30' x 2 with seated rest break) Assistive device: Rolling walker (2 wheeled) Gait Pattern/deviations: Decreased step length - right;Decreased step length - left;Wide base of support;Shuffle;Trunk flexed Gait velocity: Decreased Gait velocity interpretation: Below normal  speed for age/gender General Gait Details: cues for increasing step length, RLE tends to drag, and for posture due to forward flexed neck and elevated shoulders   Stairs            Wheelchair Mobility    Modified Rankin (Stroke Patients Only)       Balance   Sitting-balance support: Feet supported Sitting balance-Leahy Scale: Good Sitting balance - Comments: performed reaching forward and to R and L over BOS with BUEs without loss of balance, no UE support     Standing balance-Leahy Scale: Poor                      Cognition Arousal/Alertness: Awake/alert Behavior During Therapy: WFL for tasks assessed/performed Overall Cognitive Status: Within Functional Limits for tasks assessed                      Exercises      General Comments        Pertinent Vitals/Pain Pain Assessment: No/denies pain    Home Living         Home Access: Level entry            Prior Function            PT Goals (current goals can now be found in the care plan section) Acute Rehab PT Goals Patient Stated Goal: To go home PT Goal Formulation: With patient Time For Goal Achievement: 10/08/14 Potential to Achieve Goals: Good Progress towards PT goals: Progressing toward goals    Frequency  Min 3X/week    PT Plan Current plan remains appropriate    Co-evaluation  End of Session Equipment Utilized During Treatment: Gait belt Activity Tolerance: Patient tolerated treatment well Patient left: in chair;with call bell/phone within reach;with chair alarm set     Time: 1308-6578 PT Time Calculation (min) (ACUTE ONLY): 24 min  Charges:  $Gait Training: 8-22 mins $Therapeutic Activity: 8-22 mins                    G Codes:      Tamala Ser 10/05/2014, 10:46 AM [phone]

## 2014-10-05 NOTE — Progress Notes (Signed)
Rehab admissions - I met with pt in follow up to rehab consult to explain the possibility of inpatient rehab. Further questions were answered and informational brochures were given. Discussion was had about pt using only his Medicare part A coverage as pt also has coverage through [facility]. We are not in network with [facility] and pt understood what his Medicare A would cover. Pt is interested in pursuing inpatient rehab and is very motivated to return to his independent apartment.  I also called and spoke with pt's sister. Sister is in support of pt coming to CIR and states that she does check on pt daily. Sister cannot provide 24 hr supervision. Rehab MD has projected that pt may need 24-7 supervision and this was explained to both pt/sister. Pt is hoping to return home with intermittent support from his sister and home health aides.   I received clearance from Dr. Feliz Ortiz and pt will be admitted today to CIR. I called and updated Brenda, case manager and Donna, social worker.  Thanks.  Janine Turnington, PT Rehabilitation Admissions Coordinator [phone]   

## 2014-10-06 ENCOUNTER — Inpatient Hospital Stay (HOSPITAL_COMMUNITY): Payer: Medicare Other | Admitting: Occupational Therapy

## 2014-10-06 ENCOUNTER — Inpatient Hospital Stay (HOSPITAL_COMMUNITY): Payer: Non-veteran care | Admitting: Rehabilitation

## 2014-10-06 ENCOUNTER — Inpatient Hospital Stay (HOSPITAL_COMMUNITY): Payer: Non-veteran care | Admitting: Occupational Therapy

## 2014-10-06 DIAGNOSIS — S32591A Other specified fracture of right pubis, initial encounter for closed fracture: Secondary | ICD-10-CM

## 2014-10-06 DIAGNOSIS — S32501S Unspecified fracture of right pubis, sequela: Secondary | ICD-10-CM

## 2014-10-06 LAB — COMPREHENSIVE METABOLIC PANEL
ALK PHOS: 54 U/L (ref 38–126)
ALT: 14 U/L — AB (ref 17–63)
AST: 23 U/L (ref 15–41)
Albumin: 2.8 g/dL — ABNORMAL LOW (ref 3.5–5.0)
Anion gap: 6 (ref 5–15)
BILIRUBIN TOTAL: 1.3 mg/dL — AB (ref 0.3–1.2)
BUN: 15 mg/dL (ref 6–20)
CO2: 31 mmol/L (ref 22–32)
Calcium: 9 mg/dL (ref 8.9–10.3)
Chloride: 102 mmol/L (ref 101–111)
Creatinine, Ser: 1.09 mg/dL (ref 0.61–1.24)
GLUCOSE: 86 mg/dL (ref 65–99)
POTASSIUM: 5 mmol/L (ref 3.5–5.1)
SODIUM: 139 mmol/L (ref 135–145)
Total Protein: 6.7 g/dL (ref 6.5–8.1)

## 2014-10-06 LAB — CBC WITH DIFFERENTIAL/PLATELET
BASOS ABS: 0.1 10*3/uL (ref 0.0–0.1)
BASOS PCT: 1 % (ref 0–1)
EOS PCT: 6 % — AB (ref 0–5)
Eosinophils Absolute: 0.7 10*3/uL (ref 0.0–0.7)
HCT: 41 % (ref 39.0–52.0)
HEMOGLOBIN: 12.8 g/dL — AB (ref 13.0–17.0)
Lymphocytes Relative: 4 % — ABNORMAL LOW (ref 12–46)
Lymphs Abs: 0.5 10*3/uL — ABNORMAL LOW (ref 0.7–4.0)
MCH: 29 pg (ref 26.0–34.0)
MCHC: 31.2 g/dL (ref 30.0–36.0)
MCV: 93 fL (ref 78.0–100.0)
Monocytes Absolute: 1.4 10*3/uL — ABNORMAL HIGH (ref 0.1–1.0)
Monocytes Relative: 13 % — ABNORMAL HIGH (ref 3–12)
NEUTROS PCT: 76 % (ref 43–77)
Neutro Abs: 8.3 10*3/uL — ABNORMAL HIGH (ref 1.7–7.7)
PLATELETS: 208 10*3/uL (ref 150–400)
RBC: 4.41 MIL/uL (ref 4.22–5.81)
RDW: 13.5 % (ref 11.5–15.5)
WBC: 10.9 10*3/uL — ABNORMAL HIGH (ref 4.0–10.5)

## 2014-10-06 NOTE — Progress Notes (Signed)
Ranelle Oyster, MD Physician Signed Physical Medicine and Rehabilitation Consult Note 10/04/2014 1:30 PM  Related encounter: ED to Hosp-Admission (Discharged) from 09/30/2014 in [facility]    Expand All Collapse All        Physical Medicine and Rehabilitation Consult Reason for Consult:weakness and pelvic pain Referring Physician: Radonna Ricker   HPI: Martin Powell is a 69 y.o. male with MS and chronic right sided weakness, chronic wounds who after recent diarrhea fell on 6/29 and was found hypotensive in his house by EMS. He had pain in his right pelvis/thigh after fall. He was found to have c diff enteritis treated with po vancomycin as well as a right sided pubic symphysis racture. Conservative care was recommended. This patient failed to progress and rehab was consulted for recommendations   Review of Systems  Constitutional: Negative for fever.  Eyes: Negative for double vision.  Cardiovascular: Negative for palpitations.  Musculoskeletal: Positive for back pain, joint pain and falls.  Neurological: Positive for sensory change and speech change.   Past Medical History  Diagnosis Date  . Hypertension   . GERD (gastroesophageal reflux disease)   . Hypercholesteremia   . Thyroid activity decreased   . Multiple sclerosis   . Murmur, heart    Past Surgical History  Procedure Laterality Date  . Appendectomy     Family History  Problem Relation Age of Onset  . CAD Neg Hx    Social History:  reports that he has never smoked. He does not have any smokeless tobacco history on file. He reports that he does not drink alcohol or use illicit drugs. Allergies: No Known Allergies Medications Prior to Admission  Medication Sig Dispense Refill  . acetaminophen (TYLENOL) 325 MG tablet Take 325 mg by mouth every 6 (six) hours as needed for mild pain or moderate pain.    Marland Kitchen amiodarone (PACERONE) 200 MG tablet  Take 200 mg by mouth daily.    Marland Kitchen aspirin 81 MG tablet Take 81 mg by mouth daily.    Marland Kitchen atorvastatin (LIPITOR) 80 MG tablet Take 80 mg by mouth daily.    . Cholecalciferol (VITAMIN D3) 2000 UNITS TABS Take 1 tablet by mouth daily.    . Dimethyl Fumarate (TECFIDERA) 240 MG CPDR Take 240 mg by mouth 2 (two) times daily.     Marland Kitchen gabapentin (NEURONTIN) 300 MG capsule Take 300 mg by mouth 3 (three) times daily.    . hydrochlorothiazide (HYDRODIURIL) 50 MG tablet Take 50 mg by mouth daily.    Marland Kitchen levothyroxine (SYNTHROID, LEVOTHROID) 125 MCG tablet Take 125 mcg by mouth daily before breakfast.    . lisinopril-hydrochlorothiazide (PRINZIDE,ZESTORETIC) 20-12.5 MG per tablet Take 1 tablet by mouth daily.    . metoprolol (LOPRESSOR) 50 MG tablet Take 50 mg by mouth 2 (two) times daily.    . pantoprazole (PROTONIX) 20 MG tablet Take 20 mg by mouth daily.      Home: Home Living Family/patient expects to be discharged to:: Private residence Living Arrangements: Alone Available Help at Discharge: Family, Available PRN/intermittently Type of Home: Apartment Home Layout: One level Home Equipment: Environmental consultant - 2 wheels, Shower seat, Grab bars - tub/shower  Functional History: Prior Function Level of Independence: Independent with assistive device(s), Needs assistance Gait / Transfers Assistance Needed: Uses RW for gait ADL's / Homemaking Assistance Needed: Sister assists with housekeeping and errands (patient does not drive). Patient reports he has someone in the house when he showers. Functional Status:  Mobility: Bed  Mobility Overal bed mobility: Needs Assistance Bed Mobility: Rolling, Sidelying to Sit Rolling: Supervision Sidelying to sit: Max assist Sit to supine: Min guard General bed mobility comments: patient unable to bring shoulders off bed and find midline on side of bed. required max assist to reach upright and balance on edge of  bed. Transfers Overall transfer level: Needs assistance Equipment used: Rolling walker (2 wheeled) Transfers: Sit to/from Stand Sit to Stand: Mod assist General transfer comment: patient required assist to power up and max verbal cues for sequencing Ambulation/Gait Ambulation/Gait assistance: Mod assist, +2 physical assistance Ambulation Distance (Feet): 10 Feet Assistive device: Rolling walker (2 wheeled) General Gait Details: patient with severe lean to right upon standing, requiring max assist to maintain upright. On second attempt at standing/gait, patient better able to reach upright, however, after ambulating 10 feel standing with extremely wide base of support, feel slipping, leaning severly to right and requiring max assit to maintin upright. During this time, patient reporting "i am fine" and "I can make it" when in this PT's judgement, pattient about to slide to floor.  Gait Pattern/deviations: Step-to pattern, Decreased step length - right, Shuffle, Wide base of support Gait velocity: Decreased Gait velocity interpretation: <1.8 ft/sec, indicative of risk for recurrent falls    ADL:    Cognition: Cognition Overall Cognitive Status: Impaired/Different from baseline Cognition Arousal/Alertness: Awake/alert Behavior During Therapy: WFL for tasks assessed/performed Overall Cognitive Status: Impaired/Different from baseline Area of Impairment: Safety/judgement Safety/Judgement: Decreased awareness of safety General Comments: patient with extremely wide base of support and this PT felt patient was about to fall and patient reports he is "fine" and "will make it".   Blood pressure 135/95, pulse 108, temperature 98.1 F (36.7 C), temperature source Oral, resp. rate 18, height 6\' 3"  (1.905 m), weight 109.355 kg (241 lb 1.3 oz), SpO2 95 %. Physical Exam  Constitutional: He appears well-developed.  HENT:  Head: Normocephalic.  Musculoskeletal:  Right sided weakness. Right  lower ext limited by pain     Lab Results Last 24 Hours    Results for orders placed or performed during the hospital encounter of 09/30/14 (from the past 24 hour(s))  CBC Status: Abnormal   Collection Time: 10/04/14 4:37 AM  Result Value Ref Range   WBC 11.8 (H) 4.0 - 10.5 K/uL   RBC 4.41 4.22 - 5.81 MIL/uL   Hemoglobin 13.0 13.0 - 17.0 g/dL   HCT 78.5 88.5 - 02.7 %   MCV 90.9 78.0 - 100.0 fL   MCH 29.5 26.0 - 34.0 pg   MCHC 32.4 30.0 - 36.0 g/dL   RDW 74.1 28.7 - 86.7 %   Platelets 229 150 - 400 K/uL      Imaging Results (Last 48 hours)    No results found.    Assessment/Plan: Diagnosis: pelvic fracture, deconditioning in MS patient related to C diff enteritis 1. Does the need for close, 24 hr/day medical supervision in concert with the patient's rehab needs make it unreasonable for this patient to be served in a less intensive setting? Yes 2. Co-Morbidities requiring supervision/potential complications: a fib, ms 3. Due to bladder management, bowel management and skin/wound care, does the patient require 24 hr/day rehab nursing? Yes 4. Does the patient require coordinated care of a physician, rehab nurse, PT (1-2 hrs/day, 5 days/week) and OT (1-2 hrs/day, 5 days/week) to address physical and functional deficits in the context of the above medical diagnosis(es)? Yes Addressing deficits in the following areas: balance, endurance, strength, transferring, dressing,  grooming and cognition 5. Can the patient actively participate in an intensive therapy program of at least 3 hrs of therapy per day at least 5 days per week? Potentially 6. The potential for patient to make measurable gains while on inpatient rehab is good 7. Anticipated functional outcomes upon discharge from inpatient rehab are supervision with PT, supervision with OT, n/a with SLP. 8. Estimated rehab length of stay to reach the above functional goals is: 7 days 9. Does  the patient have adequate social supports and living environment to accommodate these discharge functional goals? Potentially 10. Anticipated D/C setting: Home 11. Anticipated post D/C treatments: HH therapy 12. Overall Rehab/Functional Prognosis: good  RECOMMENDATIONS: This patient's condition is appropriate for continued rehabilitative care in the following setting: CIR Patient has agreed to participate in recommended program. Potentially Note that insurance prior authorization may be required for reimbursement for recommended care.  Comment: Rehab Admissions Coordinator to follow up.  Thanks,  Ranelle Oyster, MD, [place] [facility]     10/04/2014       Routing History     Date/Time From To Method   10/04/2014 1:44 PM Ranelle Oyster, MD Ranelle Oyster, MD In Basket   10/04/2014 1:44 PM Ranelle Oyster, MD No Pcp Per Patient In Basket

## 2014-10-06 NOTE — Progress Notes (Signed)
[facility] PHYSICAL MEDICINE & REHABILITATION     PROGRESS NOTE    Subjective/Complaints: Had a good night. Only complaint is pain in left foot/toes after unna was reapplied yesterday afternoon.   ROS: Pt denies fever, rash/itching, headache, blurred or double vision, nausea, vomiting, abdominal pain, diarrhea, chest pain, shortness of breath, palpitations, dysuria, dizziness, neck or back pain, bleeding, anxiety, or depression   Objective: Vital Signs: Blood pressure 121/80, pulse 104, temperature 98.4 F (36.9 C), temperature source Oral, resp. rate 18, SpO2 94 %. No results found.  Recent Labs  10/04/14 0437 10/06/14 0535  WBC 11.8* 10.9*  HGB 13.0 12.8*  HCT 40.1 41.0  PLT 229 208    Recent Labs  10/06/14 0535  NA 139  K 5.0  CL 102  GLUCOSE 86  BUN 15  CREATININE 1.09  CALCIUM 9.0   CBG (last 3)  No results for input(s): GLUCAP in the last 72 hours.  Wt Readings from Last 3 Encounters:  10/05/14 106.278 kg (234 lb 4.8 oz)  11/12/13 99.338 kg (219 lb)    Physical Exam:  Constitutional: He is oriented to person, place, and time. He appears well-developed and well-nourished.  HENT:  Head: Normocephalic and atraumatic.  Right Ear: External ear normal.  Left Ear: External ear normal.  Eyes: Conjunctivae are normal. Pupils are equal, round, and reactive to light. Right eye exhibits no discharge. Left eye exhibits no discharge.  Neck: Normal range of motion. Neck supple. No tracheal deviation present. No thyromegaly present.  Cardiovascular: Normal rate. An irregularly irregular rhythm present.  .  No murmur heard. Respiratory: No respiratory distress. He has no wheezes. He has no rales.  GI: Soft. Bowel sounds are normal. He exhibits no distension. There is no tenderness. There is no rebound and no guarding.  Musculoskeletal: He exhibits edema (1+ pedal edema improving). Pain in right thigh with SLR  Neurological: He is alert and oriented to person,  place, and time.  Pt with reasonable insight and awareness. UE's 4/5 delt, tricep, bicep, wrist, HI. LE's 2HF, 3-KE and 4- ankle df/pf. Mild sensory deficits to LT/PP in both feet. DTR's 2+ to 3+  Skin: Skin is warm and dry.  Unna boots on BLE --tight over dorsum of left foot Psychiatric: He has a normal mood and affect. His behavior is normal.   Assessment/Plan: 1. Functional deficits secondary to right pubic ramus fracture, deconditioning after C diff enteritis which require 3+ hours per day of interdisciplinary therapy in a comprehensive inpatient rehab setting. Physiatrist is providing close team supervision and 24 hour management of active medical problems listed below. Physiatrist and rehab team continue to assess barriers to discharge/monitor patient progress toward functional and medical goals. FIM:                                 Medical Problem List and Plan: 1. Functional deficits secondary to pelvic fracture, deconditioning in MS patient related to C diff enteritis 2. DVT Prophylaxis/Anticoagulation: Pharmaceutical: Lovenox given  risk factors 3. Pain Management: Tramadol prn effective.  4. Mood: LCSW to follow for evaluation and support.  5. Neuropsych: This patient is capable of making decisions on his own behalf. 6. Stasis ulcers/Skin/Wound Care: Change Unna boots Tue/Thur  -will ask for reassessment of left foot---too tight 7. Fluids/Electrolytes/Nutrition: Monitor I/O. Continue supplements between meals.   8. C diff colitis: Vancomycin D # 6/14---thru 7/14---  -stools are more formed  -  had formed bm yesterday 9. Reactive leucocytosis: 10 MS with chronic right sided weakness: On tecfidera 11. HTN: Monitor BP bid. On me 12. A fib: HR controlled. On amiodarone, BB and low dose ASA.  -hgb climbing which should help   LOS (Days) 1 A FACE TO FACE EVALUATION WAS PERFORMED  Brenden Rudman T 10/06/2014 9:22 AM

## 2014-10-06 NOTE — Progress Notes (Signed)
Orthopedic Tech Progress Note Patient Details:  Martin Powell 1945-04-29 [id number]  Ortho Devices Type of Ortho Device: Roland Rack boot Ortho Device/Splint Location: lle Ortho Device/Splint Interventions: Application   Martin Powell 10/06/2014, 9:45 AM

## 2014-10-06 NOTE — Progress Notes (Signed)
Social Work Assessment and Plan Social Work Assessment and Plan  Patient Details  Name: Martin Powell MRN: [id number] Date of Birth: [birth year]  Today's Date: 10/06/2014  Problem List:  Patient Active Problem List   Diagnosis Date Noted  . Fracture of right inferior pubic ramus 10/06/2014  . Physical deconditioning 10/05/2014  . Pressure ulcer 10/03/2014  . Enteritis due to Clostridium difficile 10/03/2014  . Pelvic fracture 10/03/2014  . Multiple sclerosis 10/01/2014  . Hypercholesteremia 10/01/2014  . Hypotension 09/30/2014  . Dehydration 09/30/2014  . Hypothyroidism 09/30/2014  . Hyperlipidemia 09/30/2014   Past Medical History:  Past Medical History  Diagnosis Date  . Hypertension   . GERD (gastroesophageal reflux disease)   . Hypercholesteremia   . Thyroid activity decreased   . Multiple sclerosis   . Murmur, heart    Past Surgical History:  Past Surgical History  Procedure Laterality Date  . Appendectomy     Social History:  reports that he has never smoked. He does not have any smokeless tobacco history on file. He reports that he does not drink alcohol or use illicit drugs.  Family / Support Systems Marital Status: Single Patient Roles: Other (Comment) ([place] Vet and sibling) Other Supports: Pati Gallo  [phone] Anticipated Caregiver: self Ability/Limitations of Caregiver: pt may not be safe alone at home, sister checks on pt daily Caregiver Availability: Intermittent Family Dynamics: Close with siblings-sister local, while another sister is in [place] and brother in [place]. Main support is his sister Annice Pih who transports him places and checks on him daily. She also helps with home management.  Social History Preferred language: English Religion:  Cultural Background: No issues Education: [employer] Read: Yes Write: Yes Employment Status: Retired Fish farm manager Issues: No issues Guardian/Conservator: None-according to MD  pt is capable of making his own decisions while here.   Abuse/Neglect Physical Abuse: Denies Verbal Abuse: Denies Sexual Abuse: Denies Exploitation of patient/patient's resources: Denies Self-Neglect: Denies  Emotional Status Pt's affect, behavior adn adjustment status: Pt is motivated to improve and regian his independence.  He is tired from therapies but feels this is a good thing. He has been dealing with his issues for a while now and his diarrhea and pelvic fracture now has compounded this. He is willing to do what he needs to do to return home. Recent Psychosocial Issues: Other health issues,skin issues with legs and limited support Pyschiatric History: No history-deferred depression screen due to pt exhausted from therapies and is tyring to rest for the afternoon sessions. Would benefit from neuro-psych seeing him while here. Will provide support while here. Substance Abuse History: No issues  Patient / Family Perceptions, Expectations & Goals Pt/Family understanding of illness & functional limitations: Pt and sister are able to explain his health issues and treatment now. He has spoken to the MD and feels his questions are being answered.  He has multiple medical issues and has been falling at home, due to his MS and weakness from diarrhea. Premorbid pt/family roles/activities: Brother, Vet, neighbor, etc Anticipated changes in roles/activities/participation: resume Pt/family expectations/goals: Pt states: " I want to go home and be able to do for myself."  " I want to get back to being me."  Sister states: " I hope he can recover and be safe at home, I worry about him there."  [employer]: Other (Comment) ([facility] in [place]) Premorbid Home Care/DME Agencies: Other (Comment) (active pt with [facility]) Transportation available at discharge: Sister and [employer] referrals recommended: Neuropsychology,  Support group (specify)  Discharge Planning Living  Arrangements: Alone Support Systems: Other relatives, Friends/neighbors Type of Residence: Private residence Insurance Resources: [employer], Media planner (specify) (VA insurance) Financial Resources: Restaurant manager, fast food Screen Referred: No Living Expenses: Rent Money Management: Patient Does the patient have any problems obtaining your medications?: No Home Management: Sister does home management-pt does simple meal preparation Patient/Family Preliminary Plans: Pt plans to return home, he likes his apartment and doesn;t want to lose it. He and sister are hopeful he will do well here and be safe home alone. He feels if he can get stronger while here he can do better at home and not fall as much. Await team's evaluations. Social Work Anticipated Follow Up Needs: HH/OP, Support Group, ALF/IL  Clinical Impression Motivated gentleman who is willing to work in therapies to improve and become independent again. His local sister is supportive and checks on him daily but can not assist anymore than this. He needs to get mod/i level before returning home and realizes team may recommend 24 hr supervision level at discharge. Will await team's evaluations and see if any other services pt is eligible for in the home. Would benefit from neuro-psych seeing while here, will make referral. Work on a safe discharge plan.  Lucy Chris 10/06/2014, 1:16 PM

## 2014-10-06 NOTE — Care Management (Signed)
Inpatient Rehabilitation Center Individual Statement of Services  Patient Name:  Martin Powell  Date:  10/06/2014  Welcome to the Inpatient Rehabilitation Center.  Our goal is to provide you with an individualized program based on your diagnosis and situation, designed to meet your specific needs.  With this comprehensive rehabilitation program, you will be expected to participate in at least 3 hours of rehabilitation therapies Monday-Friday, with modified therapy programming on the weekends.  Your rehabilitation program will include the following services:  Physical Therapy (PT), Occupational Therapy (OT), 24 hour per day rehabilitation nursing, Therapeutic Recreaction (TR), Neuropsychology, Case Management (Social Worker), Rehabilitation Medicine, Nutrition Services and Pharmacy Services  Weekly team conferences will be held on Wednesday to discuss your progress.  Your Social Worker will talk with you frequently to get your input and to update you on team discussions.  Team conferences with you and your family in attendance may also be held.  Expected length of stay: 19-21 days Overall anticipated outcome: supervision/min level  Depending on your progress and recovery, your program may change. Your Social Worker will coordinate services and will keep you informed of any changes. Your Social Worker's name and contact numbers are listed  below.  The following services may also be recommended but are not provided by the Inpatient Rehabilitation Center:    Home Health Rehabiltiation Services  Outpatient Rehabilitation Services    Arrangements will be made to provide these services after discharge if needed.  Arrangements include referral to agencies that provide these services.  Your insurance has been verified to be:  Medicare and VA Your primary doctor is:  [place] in [place]  Pertinent information will be shared with your doctor and your insurance company.  Social Worker:  Dossie Der, SW  [phone] or [phone]  Information discussed with and copy given to patient by: Lucy Chris, 10/06/2014, 1:19 PM

## 2014-10-06 NOTE — Progress Notes (Signed)
Martin Powell Martin Powell Rehab Admission Coordinator Signed Physical Medicine and Rehabilitation PMR Pre-admission 10/05/2014 10:01 AM  Related encounter: ED to Hosp-Admission (Discharged) from 09/30/2014 in [facility]    Expand All Collapse All   PMR Admission Coordinator Pre-Admission Assessment  Patient: Martin Powell is an 69 y.o., male MRN: [id number] DOB: [birth year] Height:  (190.5 cm) Weight: 106.278 kg (234 lb 4.8 oz)  Insurance Information  PRIMARY: Medicare A only Policy#: [id number] a Subscriber: self Pre-Cert#: verified in [employer]: retired Financial risk analyst. Date: A: 11-01-07 Deduct: $1288 Out of Pocket Max: none Life Max: unlimited CIR: 100% SNF: 100% days 1-20; 80% days 21-100 (100 days max) Outpatient: no coverage Co-Pay:  Home Health: 100% Co-Pay: none DME: 80/20% Co-Pay: none Providers: pt's preference  Note: pt has VA coverage but this will not be billed as we are not in network with VA. Pt also had some initial questions about applying for Medicaid. Rehab social worker to follow up on this.  Emergency Contact Information Contact Information    Name Relation Home Work Mobile   Martin Powell Sister   [phone]     Current Medical History  Patient Admitting Diagnosis: pelvic fracture, deconditioning in MS patient related to C diff enteritis  History of Present Illness: Martin Powell is a 69 y.o. male with MS and chronic right sided weakness, chronic wounds who after recent diarrhea fell on 6/29 and was found hypotensive in his house by EMS. He had pain in his right pelvis/thigh after fall. He was found to have c diff enteritis treated with po vancomycin as well as a right sided pubic symphysis racture. Conservative care was recommended. This  patient failed to progress and rehab was consulted for recommendations.   Past Medical History  Past Medical History  Diagnosis Date  . Hypertension   . GERD (gastroesophageal reflux disease)   . Hypercholesteremia   . Thyroid activity decreased   . Multiple sclerosis   . Murmur, heart     Family History  family history is negative for CAD.  Prior Rehab/Hospitalizations:  Has the patient had major surgery during 100 days prior to admission? No  Current Medications   Current facility-administered medications:  . acetaminophen (TYLENOL) tablet 650 mg, 650 mg, Oral, Q6H PRN **OR** acetaminophen (TYLENOL) suppository 650 mg, 650 mg, Rectal, Q6H PRN, Eduard Clos, MD . amiodarone (PACERONE) tablet 200 mg, 200 mg, Oral, Daily, Eduard Clos, MD, 200 mg at 10/05/14 1003 . aspirin chewable tablet 81 mg, 81 mg, Oral, Daily, Eduard Clos, MD, 81 mg at 10/05/14 1002 . atorvastatin (LIPITOR) tablet 80 mg, 80 mg, Oral, Daily, Eduard Clos, MD, 80 mg at 10/05/14 1003 . cholecalciferol (VITAMIN D) tablet 2,000 Units, 2,000 Units, Oral, Daily, Calvert Cantor, MD, 2,000 Units at 10/05/14 1003 . Dimethyl Fumarate CPDR 240 mg, 240 mg, Oral, BID, Eduard Clos, MD, 240 mg at 10/05/14 1042 . enoxaparin (LOVENOX) injection 40 mg, 40 mg, Subcutaneous, Q24H, Eduard Clos, MD, 40 mg at 10/05/14 1003 . feeding supplement (ENSURE ENLIVE) (ENSURE ENLIVE) liquid 237 mL, 237 mL, Oral, BID BM, Eduard Clos, MD, 237 mL at 10/05/14 1000 . gabapentin (NEURONTIN) capsule 300 mg, 300 mg, Oral, TID, Eduard Clos, MD, 300 mg at 10/05/14 1003 . ipratropium (ATROVENT) 0.06 % nasal spray 2 spray, 2 spray, Each Nare, QID, Eduard Clos, MD, 2 spray at 10/05/14 1003 . levothyroxine (SYNTHROID, LEVOTHROID) tablet 125 mcg, 125 mcg, Oral, QAC breakfast, Eduard Clos, MD,  125 mcg at 10/05/14 0646 . metoprolol (LOPRESSOR) tablet 50  mg, 50 mg, Oral, BID, Calvert Cantor, MD, 50 mg at 10/05/14 1003 . multivitamin with minerals tablet 1 tablet, 1 tablet, Oral, Daily, Jenifer A Williams, RD, 1 tablet at 10/05/14 1003 . ondansetron (ZOFRAN) tablet 4 mg, 4 mg, Oral, Q6H PRN **OR** ondansetron (ZOFRAN) injection 4 mg, 4 mg, Intravenous, Q6H PRN, Eduard Clos, MD . potassium chloride SA (K-DUR,KLOR-CON) CR tablet 40 mEq, 40 mEq, Oral, BID, Rhetta Mura, MD, 40 mEq at 10/05/14 1002 . traMADol (ULTRAM) tablet 50 mg, 50 mg, Oral, Q6H PRN, Eduard Clos, MD, 50 mg at 10/04/14 2126 . vancomycin (VANCOCIN) 50 mg/mL oral solution 125 mg, 125 mg, Oral, QID, Rhetta Mura, MD, 125 mg at 10/05/14 1003  Patients Current Diet: Diet Heart Room service appropriate?: Yes; Fluid consistency:: Thin Diet - low sodium heart healthy  Precautions / Restrictions Precautions Precautions: Fall Restrictions Weight Bearing Restrictions: No RLE Weight Bearing: Weight bearing as tolerated   Has the patient had 2 or more falls or a fall with injury in the past year? Yes (pt sustained a pelvic fx during the fall PTA)  Prior Activity Level Limited Community (1-2x/wk): Pt got out on limited errands with his sister (she does the driving). He goes to the grocery with her and either uses his walker in the store or waits in the car. He enjoys his houseplants and sitting in the dayroom of his apartment complex.  Home Assistive Devices / Equipment Home Assistive Devices/Equipment: Environmental consultant (specify type), Grab bars in shower, Shower chair with back Home Equipment: Walker - 2 wheels, Shower seat, Grab bars - tub/shower  Prior Device Use: Indicate devices/aids used by the patient prior to current illness, exacerbation or injury? Walker Radiographer, therapeutic)  Prior Functional Level Prior Function Level of Independence: Independent with assistive device(s), Needs assistance Gait / Transfers Assistance Needed: Uses RW for gait ADL's /  Homemaking Assistance Needed: Sister assists with housekeeping and errands (patient does not drive). Patient reports he has someone in the house when he showers.  Self Care: Did the patient need help bathing, dressing, using the toilet or eating? Independent but sister states that pt would benefit from some help in the area of bathing and dressing.   Indoor Mobility: Did the patient need assistance with walking from room to room (with or without device)? Independent using his rolling walker  Stairs: Did the patient need assistance with internal or external stairs (with or without device)? Needed some help from his sister  Functional Cognition: Did the patient need help planning regular tasks such as shopping or remembering to take medications? Needed some help (Pt's sister helped to organize his pill box)  Current Functional Level Cognition  Overall Cognitive Status: Within Functional Limits for tasks assessed Safety/Judgement: Decreased awareness of safety General Comments: patient with extremely wide base of support and this PT felt patient was about to fall and patient reports he is "fine" and "will make it".    Extremity Assessment (includes Sensation/Coordination)  Upper Extremity Assessment: Generalized weakness  Lower Extremity Assessment: Generalized weakness    ADLs   not assessed, anticipate needs    Mobility  Overal bed mobility: Needs Assistance Bed Mobility: Rolling, Sidelying to Sit Rolling: Modified independent (Device/Increase time) Sidelying to sit: Modified independent (Device/Increase time) Sit to supine: Min guard General bed mobility comments: HOB up 25* (pt stated this is set up he uses at home with hospital bed), mod I using rail to  push up    Transfers  Overall transfer level: Needs assistance Equipment used: Rolling walker (2 wheeled) Transfers: Sit to/from Stand Sit to Stand: Min assist General transfer comment: Verbal cues for hand  placement and technique. Assist to power up to standing and for balance. Sit to stand x 3 trials.     Ambulation / Gait / Stairs / Wheelchair Mobility  Ambulation/Gait Ambulation/Gait assistance: Hydrographic surveyor (Feet): 60 Feet (30' x 2 with seated rest break) Assistive device: Rolling walker (2 wheeled) General Gait Details: cues for increasing step length, RLE tends to drag, and for posture due to forward flexed neck and elevated shoulders Gait Pattern/deviations: Decreased step length - right, Decreased step length - left, Wide base of support, Shuffle, Trunk flexed Gait velocity: Decreased Gait velocity interpretation: Below normal speed for age/gender    Posture / Balance Dynamic Sitting Balance Sitting balance - Comments: performed reaching forward and to R and L over BOS with BUEs without loss of balance, no UE support Balance Overall balance assessment: Needs assistance Sitting-balance support: Feet supported Sitting balance-Leahy Scale: Good Sitting balance - Comments: performed reaching forward and to R and L over BOS with BUEs without loss of balance, no UE support Standing balance support: Bilateral upper extremity supported Standing balance-Leahy Scale: Poor    Special needs/care consideration BiPAP/CPAP no CPM no  Continuous Drip IV no  Dialysis no  Life Vest no  Oxygen no  Special Bed no  Trach Size no  Wound Vac (area) no  Skin - bilateral LE ulcers, ACE compression wrapped  Bowel mgmt: last BM on 10-04-14 Bladder mgmt: using urinal Diabetic mgmt no   Previous Home Environment Living Arrangements: Alone Available Help at Discharge: Family, Available PRN/intermittently Type of Home: Apartment Home Layout: One level Home Access: Level entry Bathroom Shower/Tub: Engineer, manufacturing systems: Standard Home Care Services: Yes Type of Home Care Services: Home RN, Home PT, Other (Comment)  (speech therapist)  Discharge Living Setting Plans for Discharge Living Setting: Patient's home, Apartment Type of Home at Discharge: Apartment Discharge Home Layout: One level Discharge Home Access: Level entry Discharge Bathroom Shower/Tub: Tub/shower unit Discharge Bathroom Toilet: Standard Does the patient have any problems obtaining your medications?: No (pt's sister does the driving)  Social/Family/Support Systems Patient Roles: Other (Comment) (retired from [employer] ([place] Contact Information: sister Roosvelt Harps is primary contact Anticipated Caregiver: sister Annice Pih and home health aides Anticipated Caregiver's Contact Information: see above Ability/Limitations of Caregiver: sister is only able to provide intermittent supervision and checks on him daily. Home health aides come every other day. Caregiver Availability: Intermittent Discharge Plan Discussed with Primary Caregiver: Yes (discussed possible DC plans with pt/sister) Is Caregiver In Agreement with Plan?: Yes Does Caregiver/Family have Issues with Lodging/Transportation while Pt is in Rehab?: No   Note: Sister is in support of pt coming to CIR and states that she does check on pt daily. Sister cannot provide 24 hr supervision. Rehab MD has projected that pt may need 24-7 supervision and this was explained to both pt/sister. Pt is hoping to return home with intermittent support from his sister and home health aides. The possibility of assisted living was also discussed with pt/sister and rehab social worker will follow up on this. Pt has financial concerns and doesn't want to lose his subsidized apartment. Pt is very determined to return home to his independent apartment.  Goals/Additional Needs Patient/Family Goal for Rehab: Supervision with PT/OT; NA for SLP Expected length of stay: 7 days Cultural  Considerations: none Dietary Needs: heart healthy, thin liquids Equipment Needs: to be determined Pt/Family Agrees to  Admission and willing to participate: (spoke with pt and his sister (by phone)) Program Orientation Provided & Reviewed with Pt/Caregiver Including Roles & Responsibilities: Yes   Decrease burden of Care through IP rehab admission: NA  Possible need for SNF placement upon discharge: this is a possibility, depending on pt's progress and how much supervision he will need at the end of CIR stay. Family/home health cannot provide 24-7 supervision. This was discussed with both pt and his sister. Pt is very determined to return home to his independent apartment.  Patient Condition: This patient's condition remains as documented in the consult dated 10-04-14, in which the Rehabilitation Physician determined and documented that the patient's condition is appropriate for intensive rehabilitative care in an inpatient rehabilitation facility. Will admit to inpatient rehab today.  Preadmission Screen Completed By: Juliann Mule, PT, 10/05/2014 11:50 AM ______________________________________________________________________  Discussed status with Dr. Riley Kill on 10-05-14 at 1150 and received telephone approval for admission today.  Admission Coordinator: Juliann Mule, PT, time1150/Date 10-05-14          Cosigned by: Ranelle Oyster, MD at 10/05/2014 11:56 AM  Revision History     Date/Time User Provider Type Action   10/05/2014 11:56 AM Ranelle Oyster, MD Physician Cosign   10/05/2014 11:54 AM Thane Edu Rehab Admission Coordinator Sign

## 2014-10-06 NOTE — Progress Notes (Signed)
Occupational Therapy Session Note  Patient Details  Name: Martin Powell MRN: [id number] Date of Birth: 1945-08-30  Today's Date: 10/06/2014 OT Individual Time: 1400-1430 OT Individual Time Calculation (min): 30 min    Short Term Goals: Week 1:  OT Short Term Goal 1 (Week 1): Pt will perform toilet transfer with mod A in order to decrease level of assist needed for functional transfer. OT Short Term Goal 2 (Week 1): Pt will perform clothing management with min A standing balance. OT Short Term Goal 3 (Week 1): Pt will perform LB dressing with Mod A in order to increase I with self care. OT Short Term Goal 4 (Week 1): Pt will perform  UB dressing with set up A in order to increase I in self care.  Skilled Therapeutic Interventions/Progress Updates:  Engaged in therapeutic activity with focus on sit > stand and functional transfers.  Performed stand step transfer bed > w/c with mod assist, mod-max assist sit > stand with flexed posture in standing.  Provided tactile cues to facilitate upright standing.  Toilet transfer with mod assist with use of grab bars for safety, pt reports having grab bar next to toilet at home.  Engaged in sit > stand at sink with use of mirror for visual feedback to promote increased upright trunk posture in standing.  Progressed from mod-max assist with initial sit > stand to min assist with UE support. Pt requires manual facilitation at pelvis to promote upright positioning in standing.    Therapy Documentation Precautions:  Precautions Precautions: Fall Precaution Comments: hx of MS, R LE weakness w/ R lateral lean, kyphotic posture and posterior lean Restrictions Weight Bearing Restrictions: No RLE Weight Bearing: Weight bearing as tolerated General:   Vital Signs: Therapy Vitals Temp: 97.7 F (36.5 C) Temp Source: Oral Pulse Rate: (!) 102 Resp: 18 BP: 117/70 mmHg Patient Position (if appropriate): Sitting Oxygen Therapy SpO2: 98 % O2 Device: Not  Delivered Pain: Pain Assessment Pain Assessment: No/denies pain Pain Score: 0-No pain  See FIM for current functional status  Therapy/Group: Individual Therapy  Rosalio Loud 10/06/2014, 3:23 PM

## 2014-10-06 NOTE — Progress Notes (Signed)
Patient information reviewed and entered into eRehab system by Darcy Cordner, RN, CRRN, PPS Coordinator.  Information including medical coding and functional independence measure will be reviewed and updated through discharge.     Per nursing patient was given "Data Collection Information Summary for Patients in Inpatient Rehabilitation Facilities with attached "Privacy Act Statement-Health Care Records" upon admission.  

## 2014-10-06 NOTE — Interval H&P Note (Signed)
Martin Powell was admitted yesterday to Inpatient Rehabilitation with the diagnosis of debility after c diff diarrhea.  The patient's history has been reviewed, patient examined, and there is no change in status.  Patient continues to be appropriate for intensive inpatient rehabilitation.  I have reviewed the patient's chart and labs.  Questions were answered to the patient's satisfaction. The PAPE has been reviewed and assessment remains appropriate. This encounter and document were completed on 10/05/14. The H&P is now being placed in the rehab encounter for the purpose of charting.   Brahim Dolman T 10/06/2014, 9:21 AM

## 2014-10-06 NOTE — H&P (View-Only) (Signed)
Physical Medicine and Rehabilitation Admission H&P    Chief Complaint  Patient presents with  . Weakness and pelvic pain    HPI: Martin Powell is a 69 y.o. male with MS and chronic right sided weakness, chronic BLE wounds (treated recently with clindamycin) who was admitted 09/29/14 due to recurrent fall after multiple episodes of diarrhea. He was  found hypotensive in his house by EMS and required multiple fluid boluses for BP stabilization. He has complaints of right pelvis/thigh after fall and Xrays with question of nondisplaced right pubic symphysis fracture. He was found to have c diff enteritis with reactive leucocytosis and hypokalemia. Dr. Roda Shutters recommended conservative care  for pelvic fracture and he was started on po vancomycin with recommendations for two week treatment to end on 07/14.  Unna boots placed on BLE ulcers and to be changed 2 x week per WOC. This patient failed to progress and CIR was recommended  By Rehab team.    Review of Systems  HENT: Negative for hearing loss.   Eyes: Negative for blurred vision and double vision.  Respiratory: Negative for cough and shortness of breath.   Cardiovascular: Negative for chest pain and palpitations.  Gastrointestinal: Negative for heartburn, nausea, abdominal pain and diarrhea.  Genitourinary: Negative for urgency and frequency.  Musculoskeletal: Positive for falls. Negative for myalgias, back pain and joint pain.  Neurological: Positive for dizziness and focal weakness. Negative for headaches.  Psychiatric/Behavioral: The patient is not nervous/anxious and does not have insomnia.       Past Medical History  Diagnosis Date  . Hypertension   . GERD (gastroesophageal reflux disease)   . Hypercholesteremia   . Thyroid activity decreased   . Multiple sclerosis   . Murmur, heart      Past Surgical History  Procedure Laterality Date  . Appendectomy      Family History  Problem Relation Age of Onset  . CAD Neg Hx       Social History:  Lives alone. Retired Occupational hygienist. Independent for mobility with RW. Sister helps with housework and was undergoing HH therapy.   reports that he has never smoked. He does not have any smokeless tobacco history on file. He reports that he does not drink alcohol or use illicit drugs.    Allergies: No Known Allergies    Medications Prior to Admission  Medication Sig Dispense Refill  . acetaminophen (TYLENOL) 325 MG tablet Take 325 mg by mouth every 6 (six) hours as needed for mild pain or moderate pain.    Marland Kitchen amiodarone (PACERONE) 200 MG tablet Take 200 mg by mouth daily.    Marland Kitchen aspirin 81 MG tablet Take 81 mg by mouth daily.    Marland Kitchen atorvastatin (LIPITOR) 80 MG tablet Take 80 mg by mouth daily.    . Cholecalciferol (VITAMIN D3) 2000 UNITS TABS Take 1 tablet by mouth daily.    . Dimethyl Fumarate (TECFIDERA) 240 MG CPDR Take 240 mg by mouth 2 (two) times daily.     Marland Kitchen gabapentin (NEURONTIN) 300 MG capsule Take 300 mg by mouth 3 (three) times daily.    . hydrochlorothiazide (HYDRODIURIL) 50 MG tablet Take 50 mg by mouth daily.    Marland Kitchen levothyroxine (SYNTHROID, LEVOTHROID) 125 MCG tablet Take 125 mcg by mouth daily before breakfast.    . lisinopril-hydrochlorothiazide (PRINZIDE,ZESTORETIC) 20-12.5 MG per tablet Take 1 tablet by mouth daily.    . metoprolol (LOPRESSOR) 50 MG tablet Take 50 mg by mouth 2 (two) times daily.    Marland Kitchen  pantoprazole (PROTONIX) 20 MG tablet Take 20 mg by mouth daily.      Home: Home Living Family/patient expects to be discharged to:: Private residence Living Arrangements: Alone Available Help at Discharge: Family, Available PRN/intermittently Type of Home: Apartment Home Access: Level entry Home Layout: One level Home Equipment: Environmental consultant - 2 wheels, Shower seat, Grab bars - tub/shower   Functional History: Prior Function Level of Independence: Independent with assistive device(s), Needs assistance Gait / Transfers Assistance Needed: Uses RW for  gait ADL's / Homemaking Assistance Needed: Sister assists with housekeeping and errands (patient does not drive).  Patient reports he has someone in the house when he showers.  Functional Status:  Mobility: Bed Mobility Overal bed mobility: Needs Assistance Bed Mobility: Rolling, Sidelying to Sit Rolling: Supervision Sidelying to sit: Max assist Sit to supine: Min guard General bed mobility comments: patient unable to bring shoulders off bed and find midline on side of bed.  required max assist to reach upright and balance on edge of bed. Transfers Overall transfer level: Needs assistance Equipment used: Rolling walker (2 wheeled) Transfers: Sit to/from Stand Sit to Stand: Mod assist General transfer comment: patient required assist to power up and max verbal cues for sequencing Ambulation/Gait Ambulation/Gait assistance: Mod assist, +2 physical assistance Ambulation Distance (Feet): 10 Feet Assistive device: Rolling walker (2 wheeled) General Gait Details: patient with severe lean to right upon standing, requiring max assist to maintain upright.  On second attempt at standing/gait, patient better able to reach upright, however, after ambulating 10 feel standing with extremely wide base of support, feel slipping, leaning severly to right and requiring max assit to maintin upright.  During this time, patient reporting "i am fine" and "I can make it" when in this PT's judgement, pattient about to slide to floor.   Gait Pattern/deviations: Step-to pattern, Decreased step length - right, Shuffle, Wide base of support Gait velocity: Decreased Gait velocity interpretation: <1.8 ft/sec, indicative of risk for recurrent falls    ADL:    Cognition: Cognition Overall Cognitive Status: Impaired/Different from baseline Cognition Arousal/Alertness: Awake/alert Behavior During Therapy: WFL for tasks assessed/performed Overall Cognitive Status: Impaired/Different from baseline Area of  Impairment: Safety/judgement Safety/Judgement: Decreased awareness of safety General Comments: patient with extremely wide base of support and this PT felt patient was about to fall and patient reports he is "fine" and "will make it".     Blood pressure 125/83, pulse 109, temperature 98.9 F (37.2 C), temperature source Oral, resp. rate 16, height  (1.905 m), weight 106.278 kg (234 lb 4.8 oz), SpO2 96 %. Physical Exam  Nursing note and vitals reviewed. Constitutional: He is oriented to person, place, and time. He appears well-developed and well-nourished.  HENT:  Head: Normocephalic and atraumatic.  Right Ear: External ear normal.  Left Ear: External ear normal.  Eyes: Conjunctivae are normal. Pupils are equal, round, and reactive to light. Right eye exhibits no discharge. Left eye exhibits no discharge.  Neck: Normal range of motion. Neck supple. No tracheal deviation present. No thyromegaly present.  Cardiovascular: Normal rate.  An irregularly irregular rhythm present. Exam reveals no friction rub.   No murmur heard. Respiratory: No respiratory distress. He has no wheezes. He has no rales.  GI: Soft. Bowel sounds are normal. He exhibits no distension. There is no tenderness. There is no rebound and no guarding.  Musculoskeletal: He exhibits edema (1+ pedal edema bilaterally).  Neurological: He is alert and oriented to person, place, and time.  Pt with reasonable  insight and awareness. UE's 4/5 delt, tricep, bicep, wrist, HI. LE's 2HF, 3-KE and 4- ankle df/pf. Mild sensory deficits to LT/PP in both feet. DTR's 2+ to 3+  Skin: Skin is warm and dry.  Unna boots on BLE  Psychiatric: He has a normal mood and affect. His behavior is normal.    Results for orders placed or performed during the hospital encounter of 09/30/14 (from the past 48 hour(s))  CBC     Status: Abnormal   Collection Time: 10/04/14  4:37 AM  Result Value Ref Range   WBC 11.8 (H) 4.0 - 10.5 K/uL   RBC 4.41 4.22  - 5.81 MIL/uL   Hemoglobin 13.0 13.0 - 17.0 g/dL   HCT 08.6 57.8 - 46.9 %   MCV 90.9 78.0 - 100.0 fL   MCH 29.5 26.0 - 34.0 pg   MCHC 32.4 30.0 - 36.0 g/dL   RDW 62.9 52.8 - 41.3 %   Platelets 229 150 - 400 K/uL   No results found.     Medical Problem List and Plan: 1. Functional deficits secondary to pelvic fracture, deconditioning in MS patient related to C diff enteritis 2.  DVT Prophylaxis/Anticoagulation: Pharmaceutical: Lovenox 3. Pain Management: Tramadol prn effective.  4. Mood: LCSW to follow for evaluation and support.  5. Neuropsych: This patient is capable of making decisions on his own behalf. 6. Stasis ulcers/Skin/Wound Care: Change Unna boots Tue/Thur 7. Fluids/Electrolytes/Nutrition: Monitor I/O. Continue supplements between meals.  Check lytes in am. 8. C diff colitis: Vancomycin D # 5/14 9. Reactive leucocytosis: 10 MS with chronic right sided weakness: On tecfidera 11. HTN: Monitor BP bid. On me 12. A fib: HR controlled. On amiodarone, BB and low dose ASA.     Post Admission Physician Evaluation: 1. Functional deficits secondary  to pelvic fracture, deconditioning in MS patient related to C diff enteritis 2. Patient is admitted to receive collaborative, interdisciplinary care between the physiatrist, rehab nursing staff, and therapy team. 3. Patient's level of medical complexity and substantial therapy needs in context of that medical necessity cannot be provided at a lesser intensity of care such as a SNF. 4. Patient has experienced substantial functional loss from his/her baseline which was documented above under the "Functional History" and "Functional Status" headings.  Judging by the patient's diagnosis, physical exam, and functional history, the patient has potential for functional progress which will result in measurable gains while on inpatient rehab.  These gains will be of substantial and practical use upon discharge  in facilitating mobility and  self-care at the household level. 5. Physiatrist will provide 24 hour management of medical needs as well as oversight of the therapy plan/treatment and provide guidance as appropriate regarding the interaction of the two. 6. 24 hour rehab nursing will assist with bladder management, bowel management, safety, skin/wound care, disease management, medication administration, pain management and patient education  and help integrate therapy concepts, techniques,education, etc. 7. PT will assess and treat for/with: Lower extremity strength, range of motion, stamina, balance, functional mobility, safety, adaptive techniques and equipment, pain mgt, NMR, pet education.   Goals are: mod I. 8. OT will assess and treat for/with: ADL's, functional mobility, safety, upper extremity strength, adaptive techniques and equipment, NMR, balance, ego support, community reintegration.   Goals are: mod I. Therapy may not yet proceed with showering this patient. 9. SLP will assess and treat for/with: n/a.  Goals are: n/a. 10. Case Management and Social Worker will assess and treat for psychological issues and discharge  planning. 11. Team conference will be held weekly to assess progress toward goals and to determine barriers to discharge. 12. Patient will receive at least 3 hours of therapy per day at least 5 days per week. 13. ELOS: 7-10 days       14. Prognosis:  good     Ranelle Oyster, MD, [facility] Physical Medicine & Rehabilitation 10/05/2014   10/05/2014

## 2014-10-06 NOTE — Evaluation (Signed)
Physical Therapy Assessment and Plan  Patient Details  Name: Martin Powell MRN: [id number] Date of Birth: 01/25/1946  PT Diagnosis: Abnormal posture, Abnormality of gait, Coordination disorder, Difficulty walking, Impaired sensation, Muscle weakness and Pain in BLEs Rehab Potential: Fair ELOS: 19-21 days    Today's Date: 10/06/2014 PT Individual Time: 0900-1000 PT Individual Time Calculation (min): 60 min    Problem List:  Patient Active Problem List   Diagnosis Date Noted  . Fracture of right inferior pubic ramus 10/06/2014  . Physical deconditioning 10/05/2014  . Pressure ulcer 10/03/2014  . Enteritis due to Clostridium difficile 10/03/2014  . Pelvic fracture 10/03/2014  . Multiple sclerosis 10/01/2014  . Hypercholesteremia 10/01/2014  . Hypotension 09/30/2014  . Dehydration 09/30/2014  . Hypothyroidism 09/30/2014  . Hyperlipidemia 09/30/2014    Past Medical History:  Past Medical History  Diagnosis Date  . Hypertension   . GERD (gastroesophageal reflux disease)   . Hypercholesteremia   . Thyroid activity decreased   . Multiple sclerosis   . Murmur, heart    Past Surgical History:  Past Surgical History  Procedure Laterality Date  . Appendectomy      Assessment & Plan Clinical Impression: Patient is a 69 y.o. year old male with MS and chronic right sided weakness, chronic wounds who after recent diarrhea fell on 6/29 and was found hypotensive in his house by EMS. He had pain in his right pelvis/thigh after fall. He was found to have c diff enteritis treated with po vancomycin as well as a right sided pubic symphysis racture. Conservative care was recommended.  Patient transferred to CIR on 10/05/2014 .   Patient currently requires max with mobility secondary to muscle weakness, decreased cardiorespiratoy endurance and decreased awareness and decreased safety awareness.  Prior to hospitalization, patient was modified independent  with mobility and lived with Family in a  [place] home.  Home access is  Level entry.  Patient will benefit from skilled PT intervention to maximize safe functional mobility, minimize fall risk and decrease caregiver burden for planned discharge home with 24 hour assist.  Anticipate patient will benefit from follow up [facility] at discharge.  PT - End of Session Activity Tolerance: Tolerates 30+ min activity with multiple rests Endurance Deficit: Yes Endurance Deficit Description: Pt very limited by decreased endurance and needs multiple rest breaks during session.  PT Assessment Rehab Potential (ACUTE/IP ONLY): Fair Barriers to Discharge: Decreased caregiver support PT Patient demonstrates impairments in the following area(s): Balance;Endurance;Motor;Safety;Pain;Skin Integrity PT Transfers Functional Problem(s): Bed Mobility;Bed to Chair;Car;Furniture PT Locomotion Functional Problem(s): Ambulation;Wheelchair Mobility PT Plan PT Intensity: Minimum of 1-2 x/day ,45 to 90 minutes PT Frequency: 5 out of 7 days PT Duration Estimated Length of Stay: 19-21 days  PT Treatment/Interventions: Ambulation/gait training;Balance/vestibular training;Cognitive remediation/compensation;Discharge planning;Disease management/prevention;DME/adaptive equipment instruction;Functional mobility training;Pain management;Patient/family education;Skin care/wound management;Stair training;Therapeutic Activities;Therapeutic Exercise;UE/LE Strength taining/ROM;UE/LE Coordination activities;Wheelchair propulsion/positioning (stairs for strengthening) PT Transfers Anticipated Outcome(s): S  PT Locomotion Anticipated Outcome(s): min A at an ambulatory level PT Recommendation Follow Up Recommendations: Home health PT;24 hour supervision/assistance Patient destination: Home Equipment Recommended: To be determined  Skilled Therapeutic Intervention PT assessment and evaluation completed, see full details below.  Note pt received using restroom, therefore assisted back  to w/c via stand pivot at min A level with use of grab bars.  Max verbal cues for posture and reaching back for safety.  Initiated w/c propulsion education with cues for posture and technique.  Tends to lean to the R in sitting and standing.  Also noted  marked cognitive deficits during session as pt would continuously state "I know" when given max education on safety, however would not follow commands given by therapist.  Adjusted w/c to 18x18 for better fit with elevating leg rests to prevent increased pain and swelling due to being in dependent position.  Feel he may need wider chair for better fit in future.  Discussed ELOS, expected outcome, and rehab schedule.  Pt verbalized understanding.  Left pt in w/c with all needs in reach.    PT Evaluation Precautions/Restrictions Precautions Precautions: Fall Precaution Comments: hx of MS, R LE weakness w/ R lateral lean, kyphotic posture and posterior lean Restrictions Weight Bearing Restrictions: No RLE Weight Bearing: Weight bearing as tolerated General Chart Reviewed: Yes Family/Caregiver Present: No Vital SignsTherapy Vitals Pulse Rate: 96 BP: 104/68 mmHg Pain Pain Assessment Pain Assessment: 0-10 Pain Score: 6  Pain Type: Acute pain Pain Location: Leg Pain Orientation: Right;Left Pain Descriptors / Indicators: Aching Pain Intervention(s): Repositioned;Rest Home Living/Prior Functioning Home Living Available Help at Discharge: Family;Available PRN/intermittently;Personal care attendant (sister, but she works during the day, has [place] aide 2/week for 3 hours) Type of Home: Apartment Home Access: Level entry Home Layout: One level  Lives With: Family Prior Function Level of Independence: Requires assistive device for independence  Able to Take Stairs?: No Driving: No Vocation: Retired Comments: Unsure of how safe pt was at home prior to arrival, feel that he has a lot of premorbid deficits Vision/Perception   See OT  note Cognition Overall Cognitive Status: Within Functional Limits for tasks assessed Arousal/Alertness: Awake/alert Orientation Level: Oriented X4 Attention: Sustained Sustained Attention: Impaired Sustained Attention Impairment: Functional basic Awareness: Impaired Awareness Impairment: Emergent impairment Problem Solving: Impaired Problem Solving Impairment: Functional basic Safety/Judgment: Appears intact Comments: Pt impulsive to sit during session without chair being locked or ensuring that it is in correct place.  Sensation Sensation Light Touch: Impaired Detail Light Touch Impaired Details: Impaired RLE;Impaired LLE Stereognosis: Not tested Hot/Cold: Not tested Proprioception: Appears Intact Additional Comments: Decreased light touch in B feet, difficult to fully assess due to unna boots Coordination Gross Motor Movements are Fluid and Coordinated: No Fine Motor Movements are Fluid and Coordinated: No Coordination and Movement Description: Pt with many strength deficits and premorbid deficits esp in RLE causing coordination deficits.  Heel Shin Test: unable to perform due to strength deficits.  Motor  Motor Motor: Abnormal postural alignment and control;Other (comment) Motor - Skilled Clinical Observations: decreased postural control, decreased balance, decreased strength  Mobility Bed Mobility Bed Mobility: Supine to Sit;Sit to Supine Supine to Sit: 4: Min assist Supine to Sit Details: Verbal cues for sequencing;Verbal cues for technique;Verbal cues for precautions/safety;Manual facilitation for weight shifting;Manual facilitation for placement Sit to Supine: 4: Min assist Sit to Supine - Details: Verbal cues for sequencing;Verbal cues for technique;Verbal cues for precautions/safety;Manual facilitation for weight shifting;Manual facilitation for placement Transfers Transfers: Yes Sit to Stand: 3: Mod assist;2: Max assist Sit to Stand Details: Verbal cues for  sequencing;Verbal cues for technique;Verbal cues for precautions/safety;Manual facilitation for weight shifting;Manual facilitation for weight bearing;Manual facilitation for placement Stand to Sit: 3: Mod assist;2: Max assist Stand to Sit Details (indicate cue type and reason): Verbal cues for sequencing;Verbal cues for technique;Verbal cues for precautions/safety;Manual facilitation for weight shifting;Manual facilitation for weight bearing Stand Pivot Transfers: 3: Mod assist;2: Max assist Stand Pivot Transfer Details: Verbal cues for sequencing;Verbal cues for technique;Verbal cues for precautions/safety;Verbal cues for safe use of DME/AE;Verbal cues for gait pattern;Manual facilitation for weight  shifting Locomotion  Ambulation Ambulation: Yes Ambulation/Gait Assistance: 3: Mod assist;2: Max assist Ambulation Distance (Feet): 20 Feet Assistive device: Rolling walker Gait Gait: Yes Gait Pattern: Impaired Gait Pattern: Decreased stride length;Poor foot clearance - right;Trunk flexed;Lateral trunk lean to right;Lateral hip instability;Trendelenburg;Shuffle;Decreased dorsiflexion - right;Decreased dorsiflexion - left Stairs / Additional Locomotion Stairs: No (did not have time) Architect: Yes Wheelchair Assistance: 5: Supervision Wheelchair Assistance Details: Verbal cues for sequencing;Verbal cues for technique;Verbal cues for Information systems manager: Both upper extremities Wheelchair Parts Management: Needs assistance Distance: 50  Trunk/Postural Assessment  Cervical Assessment Cervical Assessment: Exceptions to [facility] Cervical Strength Overall Cervical Strength Comments: Pt with severely forward flexed neck Thoracic Assessment Thoracic Assessment: Exceptions to [facility] Thoracic Strength Overall Thoracic Strength Comments: Forward flexed posture, rounded shoulders, kyphotic posture Lumbar Assessment Lumbar Assessment: Exceptions to  [facility] Lumbar Strength Overall Lumbar Strength Comments: Limited trunk mobility and ROM, severe posterior pelvic tilt  Postural Control Postural Control: Deficits on evaluation Protective Responses: Pt with very little protective reponse in standing and tends to lose balance posteriorly and to the R.  Postural Limitations: Severe R and posterior lean in sitting and standing.   Balance Balance Balance Assessed: Yes Static Sitting Balance Static Sitting - Balance Support: Feet supported Static Sitting - Level of Assistance: 5: Stand by assistance Static Standing Balance Static Standing - Balance Support: During functional activity;Bilateral upper extremity supported Static Standing - Level of Assistance: 3: Mod assist Dynamic Standing Balance Dynamic Standing - Balance Support: During functional activity;Bilateral upper extremity supported Dynamic Standing - Level of Assistance: 3: Mod assist;2: Max assist Extremity Assessment      RLE Assessment RLE Assessment: Exceptions to [facility] RLE Strength RLE Overall Strength: Deficits RLE Overall Strength Comments: hip flex 3/5, hip ext 2+/5, knee flex 3+/5, knee ext 4/5, ankle DF/PF 2/5 (difficult due to unna boots) LLE Assessment LLE Assessment: Exceptions to [facility] LLE Strength LLE Overall Strength: Deficits LLE Overall Strength Comments: hip flex 3/5, hip ext 2+/5, knee flex 3+/5, knee ext 3+/5, ankle DF/PF 2/5 (difficult due to unna boot)  FIM:  FIM - Bed/Chair Transfer Bed/Chair Transfer: 4: Supine > Sit: Min A (steadying Pt. > 75%/lift 1 leg);4: Sit > Supine: Min A (steadying pt. > 75%/lift 1 leg);2: Bed > Chair or W/C: Max A (lift and lower assist);2: Chair or W/C > Bed: Max A (lift and lower assist) FIM - Locomotion: Wheelchair Distance: 50 Locomotion: Wheelchair: 2: Travels 50 - 149 ft with supervision, cueing or coaxing FIM - Locomotion: Ambulation Locomotion: Ambulation Assistive Devices: Administrator Ambulation/Gait Assistance:  3: Mod assist;2: Max assist Locomotion: Ambulation: 1: Travels less than 50 ft with maximal assistance (Pt: 25 - 49%) FIM - Locomotion: Stairs Locomotion: Stairs: 0: Activity did not occur (will perform tomorrow, ran out of time on day of eval)   Refer to Care Plan for Long Term Goals  Recommendations for other services: Neuropsych  Discharge Criteria: Patient will be discharged from PT if patient refuses treatment 3 consecutive times without medical reason, if treatment goals not met, if there is a change in medical status, if patient makes no progress towards goals or if patient is discharged from hospital.  The above assessment, treatment plan, treatment alternatives and goals were discussed and mutually agreed upon: by patient  Denice Bors 10/06/2014, 10:55 AM

## 2014-10-06 NOTE — Evaluation (Signed)
Occupational Therapy Assessment and Plan  Patient Details  Name: Martin Powell MRN: [id number] Date of Birth: June 29, 1945  OT Diagnosis: abnormal posture, muscle weakness (generalized) and coordination disorder Rehab Potential: Rehab Potential (ACUTE ONLY): Good ELOS: 19-21 days   Today's Date: 10/06/2014 OT Individual Time: 1100-1200 and 1500-1600 OT Individual Time Calculation (min): 60 min and 60 min   Problem List:  Patient Active Problem List   Diagnosis Date Noted  . Fracture of right inferior pubic ramus 10/06/2014  . Physical deconditioning 10/05/2014  . Pressure ulcer 10/03/2014  . Enteritis due to Clostridium difficile 10/03/2014  . Pelvic fracture 10/03/2014  . Multiple sclerosis 10/01/2014  . Hypercholesteremia 10/01/2014  . Hypotension 09/30/2014  . Dehydration 09/30/2014  . Hypothyroidism 09/30/2014  . Hyperlipidemia 09/30/2014    Past Medical History:  Past Medical History  Diagnosis Date  . Hypertension   . GERD (gastroesophageal reflux disease)   . Hypercholesteremia   . Thyroid activity decreased   . Multiple sclerosis   . Murmur, heart    Past Surgical History:  Past Surgical History  Procedure Laterality Date  . Appendectomy      Assessment & Plan Clinical Impression: Patient is a 69 y.o. year old male withMS and chronic right sided weakness, chronic BLE wounds (treated recently with clindamycin) who was admitted 09/29/14 due to recurrent fall after multiple episodes of diarrhea. He was found hypotensive in his house by EMS and required multiple fluid boluses for BP stabilization. He has complaints of right pelvis/thigh after fall and Xrays with question of nondisplaced right pubic symphysis fracture. He was found to have c diff enteritis with reactive leucocytosis and hypokalemia. Dr. Erlinda Hong recommended conservative care for pelvic fracture and he was started on po vancomycin with recommendations for two week treatment to end on 07/14. Unna boots placed on  BLE ulcers and to be changed 2 x week per WOC .  Patient transferred to CIR on 10/05/2014 .    Patient currently requires min-total A with basic self-care skills secondary to muscle weakness, decreased cardiorespiratoy endurance, decreased attention, decreased awareness, decreased problem solving, decreased safety awareness, decreased memory and delayed processing and decreased sitting balance, decreased standing balance, decreased postural control and decreased balance strategies.  Prior to hospitalization, patient could complete ADLs and some IADLs with supervision - min A per pt report.  Patient will benefit from skilled intervention to decrease level of assist with basic self-care skills and decrease level of assistance with IADLs prior to discharge home with care partner.  Anticipate patient will require 24 hour supervision and minimal physical assistance and follow up home health.  OT - End of Session Activity Tolerance: Decreased this session Endurance Deficit: Yes Endurance Deficit Description: Pt very limited by decreased endurance and needs multiple rest breaks during session.  OT Assessment Rehab Potential (ACUTE ONLY): Good Barriers to Discharge:  (none known at this time) OT Patient demonstrates impairments in the following area(s): Balance;Cognition;Endurance;Motor;Safety OT Basic ADL's Functional Problem(s): Grooming;Bathing;Dressing;Toileting OT Advanced ADL's Functional Problem(s): Simple Meal Preparation;Laundry OT Transfers Functional Problem(s): Toilet OT Additional Impairment(s): None OT Plan OT Intensity: Minimum of 1-2 x/day, 45 to 90 minutes OT Frequency: 5 out of 7 days OT Duration/Estimated Length of Stay: 19-21 days OT Treatment/Interventions: Medical illustrator training;Community reintegration;Neuromuscular re-education;Patient/family education;Self Care/advanced ADL retraining;Therapeutic Exercise;UE/LE Coordination activities;UE/LE Strength taining/ROM;Therapeutic  Activities;Psychosocial support;Functional mobility training;DME/adaptive equipment instruction;Discharge planning;Cognitive remediation/compensation OT Self Feeding Anticipated Outcome(s): n/a OT Basic Self-Care Anticipated Outcome(s): supervision - min A OT Toileting Anticipated Outcome(s): Min A  OT Bathroom  Transfers Anticipated Outcome(s): min A toilet OT Recommendation Recommendations for Other Services: Neuropsych consult Patient destination: Home Follow Up Recommendations: 24 hour supervision/assistance;Home health OT Equipment Recommended: 3 in 1 bedside comode;Tub/shower bench   Skilled Therapeutic Intervention Session 1: Upon entering the room, pt seated in recliner chair with no c/o pain. Pt requiring max A stand step pivot into wheelchair from low chair. Pt educated on OT purpose, POC, goals, and estimated LOS with pt verbalizing understanding. Pt engaged in bathing and dressing this session seated in wheelchair at sink side. Pt requiring simple, 1 step verbal cues for tasks with mod verbal and demonstration cues for tasks secondary to safety,problem solving, initiation, and sequencing during novel task. Pt required Mod Stand pivot from wheelchair with verbal cues and manual facilitation for anterior weight shift. Pt standing with mod A balance while performing LB washing and clothing management. Pt seated in wheelchair for grooming tasks at sink with set up A. Pt seated in wheelchair with call bell and all needed items within reach upon exiting the room.   Session 2: Upon entering the room, pt seated in wheelchair with no c/o pain this session. Pt referring to scheduled for therapist name but does not recognize therapist from 4 hours prior and is unable to verbalize what occurred in prior OT session. Pt engaged in 2 sets of 10 anterior weight shift exercise to reach forward and touch shoulder of therapist. Pt performed 2 sets of 10 wheelchair push ups in order to increase B UE strength  for STS. OT demonstrating and educating pt on B UE strengthening exercises with use of level 3 resistance theraband 2 sets of 10 chest pulls, bicep curls, and alternating punches. Pt requiring rest breaks in between sets of exercise secondary to fatigue. Pt seated in wheelchair with call bell and all needed items within reach upon exiting the room.   OT Evaluation Precautions/Restrictions  Precautions Precautions: Fall Precaution Comments: hx of MS, R LE weakness w/ R lateral lean, kyphotic posture and posterior lean Restrictions Weight Bearing Restrictions: No RLE Weight Bearing: Weight bearing as tolerated Vital Signs Therapy Vitals Pulse Rate: 96 BP: 104/68 mmHg Pain Pain Assessment Pain Assessment: No/denies pain Pain Score: 0-No pain Pain Type: Acute pain Pain Location: Leg Pain Orientation: Right;Left Pain Descriptors / Indicators: Aching Pain Intervention(s): Repositioned;Rest Home Living/Prior Functioning Home Living Available Help at Discharge: Family, Available PRN/intermittently, Personal care attendant Type of Home: Apartment Home Access: Level entry Home Layout: One level  Lives With: Alone Prior Function Level of Independence: Requires assistive device for independence  Able to Take Stairs?: No Driving: No Vocation: Retired Comments: Unsure of how safe pt was at home prior to arrival, feel that he has a lot of premorbid deficits  Vision/Perception  Vision- History Baseline Vision/History: No visual deficits Patient Visual Report: No change from baseline Vision- Assessment Vision Assessment?: No apparent visual deficits  Cognition Overall Cognitive Status: Within Functional Limits for tasks assessed Arousal/Alertness: Awake/alert Orientation Level: Person Year: Other (Comment) (2000) Month: May Day of Week: Incorrect Memory: Impaired Memory Impairment: Decreased recall of new information Immediate Memory Recall: Sock;Blue;Bed Memory Recall: Bed Memory  Recall Blue: Without Cue Memory Recall Bed: Without Cue Attention: Sustained Sustained Attention: Impaired Sustained Attention Impairment: Functional basic Awareness: Impaired Awareness Impairment: Emergent impairment Problem Solving: Impaired Problem Solving Impairment: Functional basic Safety/Judgment: Appears intact Comments: Pt impulsive to sit during session without chair being locked or ensuring that it is in correct place.  Sensation Sensation Light Touch: Impaired Detail  Light Touch Impaired Details: Impaired RLE;Impaired LLE Stereognosis: Not tested Hot/Cold: Appears Intact Proprioception: Appears Intact Additional Comments: Decreased light touch in B feet, difficult to fully assess due to unna boots Coordination Gross Motor Movements are Fluid and Coordinated: No Fine Motor Movements are Fluid and Coordinated: No Coordination and Movement Description: Pt with many strength deficits and premorbid deficits   Heel Shin Test: unable to perform due to strength deficits.  Motor  Motor Motor: Abnormal postural alignment and control;Other (comment) Motor - Skilled Clinical Observations: decreased postural control, decreased balance, decreased strength Mobility  Bed Mobility Bed Mobility: Supine to Sit;Sit to Supine Supine to Sit: 4: Min assist Supine to Sit Details: Verbal cues for sequencing;Verbal cues for technique;Verbal cues for precautions/safety;Manual facilitation for weight shifting;Manual facilitation for placement Sit to Supine: 4: Min assist Sit to Supine - Details: Verbal cues for sequencing;Verbal cues for technique;Verbal cues for precautions/safety;Manual facilitation for weight shifting;Manual facilitation for placement Transfers Sit to Stand: 3: Mod assist;2: Max assist Sit to Stand Details: Verbal cues for sequencing;Verbal cues for technique;Verbal cues for precautions/safety;Manual facilitation for weight shifting;Manual facilitation for weight  bearing;Manual facilitation for placement Stand to Sit: 3: Mod assist;2: Max assist Stand to Sit Details (indicate cue type and reason): Verbal cues for sequencing;Verbal cues for technique;Verbal cues for precautions/safety;Manual facilitation for weight shifting;Manual facilitation for weight bearing  Trunk/Postural Assessment  Cervical Assessment Cervical Assessment: Exceptions to [facility] Cervical Strength Overall Cervical Strength Comments: Pt with severely forward flexed neck Thoracic Assessment Thoracic Assessment: Exceptions to [facility] Thoracic Strength Overall Thoracic Strength Comments: Forward flexed posture, rounded shoulders, kyphotic posture Lumbar Assessment Lumbar Assessment: Exceptions to [facility] Lumbar Strength Overall Lumbar Strength Comments: Limited trunk mobility and ROM, severe posterior pelvic tilt  Postural Control Postural Control: Deficits on evaluation Protective Responses: Pt with very little protective reponse in standing and tends to lose balance posteriorly and to the R.  Postural Limitations: Severe R and posterior lean in sitting and standing.   Balance Balance Balance Assessed: Yes Static Sitting Balance Static Sitting - Balance Support: Feet supported Static Sitting - Level of Assistance: 5: Stand by assistance Static Standing Balance Static Standing - Balance Support: During functional activity;Bilateral upper extremity supported Static Standing - Level of Assistance: 3: Mod assist Dynamic Standing Balance Dynamic Standing - Balance Support: During functional activity;Bilateral upper extremity supported Dynamic Standing - Level of Assistance: 3: Mod assist;2: Max assist Extremity/Trunk Assessment RUE Assessment RUE Assessment: Within Functional Limits (4+/5) LUE Assessment LUE Assessment: Within Functional Limits  FIM:  FIM - Grooming Grooming Steps: Wash, rinse, dry face;Wash, rinse, dry hands;Oral care, brush teeth, clean dentures;Brush, comb  hair Grooming: 5: Set-up assist to obtain items FIM - Bathing Bathing Steps Patient Completed: Chest;Right Arm;Left Arm;Abdomen;Front perineal area;Buttocks Bathing: 4: Min-Patient completes 8-9 [id number] parts or 75+ percent FIM - Upper Body Dressing/Undressing Upper body dressing/undressing steps patient completed: Thread/unthread right sleeve of pullover shirt/dresss;Thread/unthread left sleeve of pullover shirt/dress;Put head through opening of pull over shirt/dress Upper body dressing/undressing: 4: Min-Patient completed 75 plus % of tasks FIM - Lower Body Dressing/Undressing Lower body dressing/undressing: 1: Total-Patient completed less than 25% of tasks FIM - Bed/Chair Transfer Bed/Chair Transfer: 4: Supine > Sit: Min A (steadying Pt. > 75%/lift 1 leg);4: Sit > Supine: Min A (steadying pt. > 75%/lift 1 leg);2: Bed > Chair or W/C: Max A (lift and lower assist);2: Chair or W/C > Bed: Max A (lift and lower assist)   Refer to Care Plan for Long Term Goals  Recommendations for other services: Neuropsych  Discharge Criteria: Patient will be discharged from OT if patient refuses treatment 3 consecutive times without medical reason, if treatment goals not met, if there is a change in medical status, if patient makes no progress towards goals or if patient is discharged from hospital.  The above assessment, treatment plan, treatment alternatives and goals were discussed and mutually agreed upon: by patient  Phineas Semen 10/06/2014, 11:56 AM

## 2014-10-07 ENCOUNTER — Inpatient Hospital Stay (HOSPITAL_COMMUNITY): Payer: Non-veteran care

## 2014-10-07 ENCOUNTER — Inpatient Hospital Stay (HOSPITAL_COMMUNITY): Payer: Medicare Other | Admitting: Physical Therapy

## 2014-10-07 ENCOUNTER — Encounter (HOSPITAL_COMMUNITY): Payer: Non-veteran care

## 2014-10-07 LAB — GI PATHOGEN PANEL BY PCR, STOOL
CAMPYLOBACTER BY PCR: NOT DETECTED
CRYPTOSPORIDIUM BY PCR: NOT DETECTED
E COLI 0157 BY PCR: NOT DETECTED
E coli (ETEC) LT/ST: NOT DETECTED
E coli (STEC): NOT DETECTED
G LAMBLIA BY PCR: NOT DETECTED
Norovirus GI/GII: NOT DETECTED
ROTAVIRUS A BY PCR: NOT DETECTED
SALMONELLA BY PCR: NOT DETECTED
Shigella by PCR: NOT DETECTED

## 2014-10-07 NOTE — Progress Notes (Signed)
Occupational Therapy Session Note  Patient Details  Name: Martin Powell MRN: [id number] Date of Birth: 06-Nov-1945  Today's Date: 10/07/2014 OT Individual Time: 0800-0900 OT Individual Time Calculation (min): 60 min    Short Term Goals: Week 1:  OT Short Term Goal 1 (Week 1): Pt will perform toilet transfer with mod A in order to decrease level of assist needed for functional transfer. OT Short Term Goal 2 (Week 1): Pt will perform clothing management with min A standing balance. OT Short Term Goal 3 (Week 1): Pt will perform LB dressing with Mod A in order to increase I with self care. OT Short Term Goal 4 (Week 1): Pt will perform  UB dressing with set up A in order to increase I in self care.  Skilled Therapeutic Interventions/Progress Updates:    Pt engaged in BADL retraining including bathing and dressing with sit<>stand from w/c at sink. Pt required mod A/min A for sit<>stand at sink and min A for standing balance during bathing tasks and when pulling up pants.  Pt exhibited left lateral lean when standing and sitting.  Pt required max verbal cues for correcting standing posture at sink.  Pt completed grooming tasks including shaving while seated at sink.  Pt required more than reasonable amount of time to complete tasks and requested rest break after standing at sink during LB bathing tasks.  Focus on activity tolerance, sit<>stand, standing balance, task initiation, and safety awareness.  Therapy Documentation Precautions:  Precautions Precautions: Fall Precaution Comments: hx of MS, R LE weakness w/ R lateral lean, kyphotic posture and posterior lean Restrictions Weight Bearing Restrictions: No RLE Weight Bearing: Weight bearing as tolerated   Pain: Pain Assessment Pain Assessment: 0-10 Pain Score: 10-Worst pain ever Pain Type: Acute pain Pain Location: Leg Pain Orientation: Left Pain Descriptors / Indicators: Aching Pain Onset: On-going Pain Intervention(s): RN made  aware;Repositioned  See FIM for current functional status  Therapy/Group: Individual Therapy  Rich Brave 10/07/2014, 9:03 AM

## 2014-10-07 NOTE — Progress Notes (Signed)
[place] PH[facility]ICAL MEDICINE & REHABILITATION     PROGRESS NOTE    Subjective/Complaints: No new problems. Left foot feels better although still a little tender. Appetite good.  Having formed stools  ROS: Pt denies fever, rash/itching, headache, blurred or double vision, nausea, vomiting, abdominal pain, diarrhea, chest pain, shortness of breath, palpitations, dysuria, dizziness, neck or back pain, bleeding, anxiety, or depression   Objective: Vital Signs: Blood pressure 120/56, pulse 97, temperature 97.7 F (36.5 C), temperature source Oral, resp. rate 18, SpO2 97 %. No results found.  Recent Labs  10/06/14 0535  WBC 10.9*  HGB 12.8*  HCT 41.0  PLT 208    Recent Labs  10/06/14 0535  NA 139  K 5.0  CL 102  GLUCOSE 86  BUN 15  CREATININE 1.09  CALCIUM 9.0   CBG (last 3)  No results for input(s): GLUCAP in the last 72 hours.  Wt Readings from Last 3 Encounters:  10/05/14 106.278 kg (234 lb 4.8 oz)  11/12/13 99.338 kg (219 lb)    Physical Exam:  Constitutional: He is oriented to person, place, and time. He appears well-developed and well-nourished.  HENT:  Head: Normocephalic and atraumatic.  Right Ear: External ear normal.  Left Ear: External ear normal.  Eyes: Conjunctivae are normal. Pupils are equal, round, and reactive to light. Right eye exhibits no discharge. Left eye exhibits no discharge.  Neck: Normal range of motion. Neck supple. No tracheal deviation present. No thyromegaly present.  Cardiovascular: Normal rate. An irregularly irregular rhythm is present.  .  No murmur heard. Respiratory: No respiratory distress. He has no wheezes. He has no rales.  GI: Soft. Bowel sounds are normal. He exhibits no distension. There is no tenderness. There is no rebound and no guarding.  Musculoskeletal: He exhibits edema (1+ pedal edema improving). Ongoing Pain in right thigh with SLR  Neurological: He is alert and oriented to person, place, and time.  Pt with  reasonable insight and awareness. UE's 4/5 delt, tricep, bicep, wrist, HI. LE's 2HF, 3-KE and 4- ankle df/pf. Mild sensory deficits to LT/PP in both feet. DTR's 2+ to 3+  Skin: Skin is warm and dry.  Unna boots on BLE --tight over dorsum of left foot Psychiatric: He has a normal mood and affect. His behavior is normal.   Assessment/Plan: 1. Functional deficits secondary to right inferior pubic ramus fracture, deconditioning after C diff enteritis which require 3+ hours per day of interdisciplinary therapy in a comprehensive inpatient rehab setting. Physiatrist is providing close team supervision and 24 hour management of active medical problems listed below. Physiatrist and rehab team continue to assess barriers to discharge/monitor patient progress toward functional and medical goals. FIM: FIM - Bathing Bathing Steps Patient Completed: Chest, Right Arm, Left Arm, Abdomen, Front perineal area, Buttocks Bathing: 4: Min-Patient completes 8-9 41f 10 parts or 75+ percent  FIM - Upper Body Dressing/Undressing Upper body dressing/undressing steps patient completed: Thread/unthread right sleeve of pullover shirt/dresss, Thread/unthread left sleeve of pullover shirt/dress, Put head through opening of pull over shirt/dress, Pull shirt over trunk Upper body dressing/undressing: 5: Set-up assist to: Obtain clothing/put away FIM - Lower Body Dressing/Undressing Lower body dressing/undressing steps patient completed: Thread/unthread right underwear leg, Thread/unthread left underwear leg, Thread/unthread right pants leg Lower body dressing/undressing: 2: Max-Patient completed 25-49% of tasks     FIM - Diplomatic Services operational officer Devices: Elevated toilet seat, Grab bars (BSC over toilet) Toilet Transfers: 3-To toilet/BSC: Mod A (lift or lower assist), 3-From  toilet/BSC: Mod A (lift or lower assist)  FIM - Bed/Chair Transfer Bed/Chair Transfer: 4: Supine > Sit: Min A (steadying Pt. >  75%/lift 1 leg), 4: Sit > Supine: Min A (steadying pt. > 75%/lift 1 leg), 2: Bed > Chair or W/C: Max A (lift and lower assist), 2: Chair or W/C > Bed: Max A (lift and lower assist)  FIM - Locomotion: Wheelchair Distance: 50 Locomotion: Wheelchair: 2: Travels 50 - 149 ft with supervision, cueing or coaxing FIM - Locomotion: Ambulation Locomotion: Ambulation Assistive Devices: Designer, industrial/product Ambulation/Gait Assistance: 3: Mod assist, 2: Max assist Locomotion: Ambulation: 1: Travels less than 50 ft with maximal assistance (Pt: 25 - 49%)  Comprehension Comprehension Mode: Auditory Comprehension: 5-Follows basic conversation/direction: With no assist  Expression Expression Mode: Verbal Expression: 5-Expresses basic needs/ideas: With no assist  Social Interaction Social Interaction: 6-Interacts appropriately with others with medication or extra time (anti-anxiety, antidepressant).  Problem Solving Problem Solving: 4-Solves basic 75 - 89% of the time/requires cueing 10 - 24% of the time  Memory Memory: 3-Recognizes or recalls 50 - 74% of the time/requires cueing 25 - 49% of the time Medical Problem List and Plan: 1. Functional deficits secondary to pelvic fracture, deconditioning in MS patient related to C diff enteritis 2. DVT Prophylaxis/Anticoagulation: Pharmaceutical: Lovenox given  risk factors 3. Pain Management: Tramadol prn effective.  4. Mood: LCSW to follow for evaluation and support.  5. Neuropsych: This patient is capable of making decisions on his own behalf. 6. Stasis ulcers/Skin/Wound Care: Change Unna boots Tue/Thur  -left sided dressing adjusted--pt more comfortable 7. Fluids/Electrolytes/Nutrition: Monitor I/O. Continue supplements between meals.   8. C diff colitis: Vancomycin D # 6/14---thru 7/14---  -stools are more formed 9. Reactive leucocytosis: 10 MS with chronic right sided weakness: continue tecfidera 11. HTN: Monitor BP bid. On me 12. A fib: HR  controlled. On amiodarone, BB and low dose ASA.  -hgb climbing which should help HR control   LOS (Days) 2 A FACE TO FACE EVALUATION WAS PERFORMED  SWARTZ,ZACHARY T 10/07/2014 9:03 AM

## 2014-10-07 NOTE — Progress Notes (Signed)
Physical Therapy Session Note  Patient Details  Name: Martin Powell MRN: [id number] Date of Birth: 14-Oct-1945  Today's Date: 10/07/2014 PT Individual Time: 1100-1200 and 1400-1500 PT Individual Time Calculation (min): 60 min and 60 min (total 120 min)  Short Term Goals: Week 1:  PT Short Term Goal 1 (Week 1): Pt will perform bed mobility with HOB flat and without rails at S level  PT Short Term Goal 2 (Week 1): Pt will perform sit<>stand consistently at min A level with min cues for technique PT Short Term Goal 3 (Week 1): Pt will perform dynamic standing balance with single UE support x 3-5 mins to increase independence with ADL's PT Short Term Goal 4 (Week 1): Pt will perform gait x 66' with LRAD at mod A level   Skilled Therapeutic Interventions/Progress Updates:    1100-1200 session: Pt received in w/c with no c/o pain, stating that his L foot was bothering him earlier however he took medication and no longer has pain. Agreeable to treatment. Performed w/c propulsion with BUEs with very slow speed and frequent rest breaks needed due to fatigue. Performed ascent/descent of 6 3" stairs with BUEs on handrails and minA due to poor eccentric control and impaired postural alignment reducing safety. Performed standing at table for 5 trials of approx 3 min each while playing card game. Pt demonstrates cognitive impairments with recall and focused attention, requiring repetitive cues for recall of rules, playing in order, etc. Required several seated rest breaks between trials due to fatigue. Initial trial, pt sat suddenly with poor eccentric control without warning; instructed pt to give therapist warning to allow for assist to slow descent; all other rest breaks pt announced needing to sit, and performed sitting safely. Performed transfer w/c <> nustep with modA and mod/max verbal cues for sequencing and safety, as pt has tendency to sit before lining up with chair, sitting on arm rest. Performed Nustep x8  min with BUEs/BLEs to improve UE/LE strength as well as cardiovascular endurance for carryover into gait and functional adls. Pt returned to room and remained seated in w/c with all needs within reach at completion of session.  1400-1500 session: Pt received seated in w/c with no c/o pain and agreeable to treatment. Pt taken to therapy gym with totalA x150'. In parallel bars pt instructed in standing LE strengthening exercises including hip flexion, hip abduction, hamstring curl, mini-squat with BUE support on bars. Pt requires repetitive verbal/tactile cues for upright posture including hip extension and trunk extension and unable to maintain >5-10 seconds with removal of cues. Performed transfer w/c <> mat table with RW and minA, with repetitive cues for foot placement and upright posture. On mat table performed supine hip flexor PROM, with pec major PROM stretch performed by therapist on BUEs, hamstring PROM stretch performed on BLEs by therapist. Pt educated regarding the importance of muscle length/ROM on standing posture and balance. Performed supine LE strengthening exercises including heel slides, straight leg raise, short arc quad, bridging. Pt returned to room and remained seated in w/c at completion of session with all needs within reach.   Therapy Documentation Precautions:  Precautions Precautions: Fall Precaution Comments: hx of MS, R LE weakness w/ R lateral lean, kyphotic posture and posterior lean Restrictions Weight Bearing Restrictions: No RLE Weight Bearing: Weight bearing as tolerated Pain: Pain Assessment Pain Assessment: No/denies pain Pain Score: 0-No pain Pain Type: Acute pain Pain Location: Leg Pain Orientation: Left Pain Descriptors / Indicators: Aching Pain Onset: Gradual Patients Stated Pain  Goal: 3 Pain Intervention(s): Medication (See eMAR) (ultram 50 mg po)  See FIM for current functional status  Therapy/Group: Individual Therapy  Vista Lawman 10/07/2014, 12:45 PM

## 2014-10-07 NOTE — Progress Notes (Signed)
Occupational Therapy Note  Patient Details  Name: Martin Powell MRN: [id number] Date of Birth: March 05, 1946  Today's Date: 10/07/2014 OT Individual Time: 1000-1030 OT Individual Time Calculation (min): 30 min   Pt stated the pain in his left leg was much improved since receiving medications. Individual Therapy  Pt engaged in functional transfers including toilet transfers and furniture transfers.  Pt requires max verbal cues for sequencing with sit<>stand and safety awareness.  Pt transitioned to functional amb with RW for simple home mgmt tasks.  Pt requires max verbal cues for RW safety and with sit<>stand.  Pt continues to exhibit left lateral lean when standing and with functional amb. Focus on activity tolerance, sit<>stand, standing balance, functional transfers, functional amb with RW, and safety awareness.   [name] [facility] 10/07/2014, 10:33 AM

## 2014-10-07 NOTE — Progress Notes (Signed)
Initial Nutrition Assessment  DOCUMENTATION CODES:  Not applicable  INTERVENTION:  Ensure Enlive (each supplement provides 350kcal and 20 grams of protein) (BID)   Encourage adequate PO intake.   NUTRITION DIAGNOSIS:  Increased nutrient needs related to wound healing as evidenced by estimated needs.  GOAL:  Patient will meet greater than or equal to 90% of their needs  MONITOR:  PO intake, Supplement acceptance, Weight trends, Labs, I & O's  REASON FOR ASSESSMENT:  Malnutrition Screening Tool    ASSESSMENT: Pt with MS and chronic right sided weakness, chronic BLE wounds (treated recently with clindamycin) who was admitted 09/29/14 due to recurrent fall after multiple episodes of diarrhea. Pt found to have c diff enteritis with reactive leucocytosis and hypokalemia. Conservative carefor pelvic fracture. Unna boots placed on BLE ulcers.  Pt reports having a good appetite currently and PTA with no other difficulties. Meal completion has been 100%. No weight loss. Noted pt with pressure ulcer on L heel. Pt currently has Ensure ordered and has been consuming them. RD to continue with orders.   NFPE completed. Pt with no observed significant fat or muscle mass loss.   Labs and medications reviewed.   Height:  Ht Readings from Last 1 Encounters:  09/30/14 6\' 3"  (1.905 m)    Weight:  Wt Readings from Last 1 Encounters:  10/05/14 234 lb 4.8 oz (106.278 kg)    Ideal Body Weight:  89 kg  Wt Readings from Last 10 Encounters:  10/05/14 234 lb 4.8 oz (106.278 kg)  11/12/13 219 lb (99.338 kg)    BMI:  Body Mass Index: 29.36 kg/(m^2)  Estimated Nutritional Needs:  Kcal:  2200-2400  Protein:  115-130 grams  Fluid:  2.2 - 2.4 L/day  Skin:  Wound (see comment) (Stage II pressure ulcer on L heel, +1 LE edema)  Diet Order:  Diet Heart Room service appropriate?: Yes; Fluid consistency:: Thin  EDUCATION NEEDS:  No education needs identified at this  time   Intake/Output Summary (Last 24 hours) at 10/07/14 1525 Last data filed at 10/07/14 1500  Gross per 24 hour  Intake    720 ml  Output   1400 ml  Net   -680 ml    Last BM:  7/7  Roslyn Smiling, MS, RD, LDN Pager # [phone] After hours/ weekend pager # [phone]

## 2014-10-08 ENCOUNTER — Inpatient Hospital Stay (HOSPITAL_COMMUNITY): Payer: Medicare Other | Admitting: Occupational Therapy

## 2014-10-08 ENCOUNTER — Inpatient Hospital Stay (HOSPITAL_COMMUNITY): Payer: Non-veteran care | Admitting: Physical Therapy

## 2014-10-08 NOTE — IPOC Note (Signed)
Overall Plan of Care [facility]) Patient Details Name: Martin Powell MRN: [id number] DOB: [birth year]  Admitting Diagnosis: Pelvic fx deconditioning in MS   Hospital Problems: Principal Problem:   Physical deconditioning Active Problems:   Hypothyroidism   Multiple sclerosis   Enteritis due to Clostridium difficile   Fracture of right inferior pubic ramus     Functional Problem List: Nursing Bladder, Bowel, Edema, Endurance, Medication Management, Pain, Safety, Skin Integrity  PT Balance, Endurance, Motor, Safety, Pain, Skin Integrity  OT Balance, Cognition, Endurance, Motor, Safety  SLP    TR         Basic ADL's: OT Grooming, Bathing, Dressing, Toileting     Advanced  ADL's: OT Simple Meal Preparation, Laundry     Transfers: PT Bed Mobility, Bed to Chair, Set designer, Oncologist: PT Ambulation, Psychologist, prison and probation services     Additional Impairments: OT None  SLP        TR      Anticipated Outcomes Item Anticipated Outcome  Self Feeding n/a  Swallowing      Basic self-care  supervision - min A  Toileting  Min A    Bathroom Transfers min A toilet  Bowel/Bladder  patient will be continent of bowel an bladder with min assist  Transfers  S   Locomotion  min A at an ambulatory level  Communication     Cognition     Pain  Pain will be less than or eual to 4 on a scale of 0-10  Safety/Judgment  patient will be free from falls/injury    Therapy Plan: PT Intensity: Minimum of 1-2 x/day ,45 to 90 minutes PT Frequency: 5 out of 7 days PT Duration Estimated Length of Stay: 19-21 days  OT Intensity: Minimum of 1-2 x/day, 45 to 90 minutes OT Frequency: 5 out of 7 days OT Duration/Estimated Length of Stay: 19-21 days         Team Interventions: Nursing Interventions Patient/Family Education, Bladder Management, Bowel Management, Pain Management, Medication Management, Skin Care/Wound Management, Discharge Planning  PT interventions Ambulation/gait  training, Balance/vestibular training, Cognitive remediation/compensation, Discharge planning, Disease management/prevention, DME/adaptive equipment instruction, Functional mobility training, Pain management, Patient/family education, Skin care/wound management, Stair training, Therapeutic Activities, Therapeutic Exercise, UE/LE Strength taining/ROM, UE/LE Coordination activities, Wheelchair propulsion/positioning (stairs for strengthening)  OT Interventions Warden/ranger, Firefighter, Neuromuscular re-education, Patient/family education, Self Care/advanced ADL retraining, Therapeutic Exercise, UE/LE Coordination activities, UE/LE Strength taining/ROM, Therapeutic Activities, Psychosocial support, Functional mobility training, DME/adaptive equipment instruction, Discharge planning, Cognitive remediation/compensation  SLP Interventions    TR Interventions    SW/CM Interventions Discharge Planning, Psychosocial Support, Patient/Family Education    Team Discharge Planning: Destination: PT-Home ,OT- Home , SLP-  Projected Follow-up: PT-Home health PT, 24 hour supervision/assistance, OT-  24 hour supervision/assistance, Home health OT, SLP-  Projected Equipment Needs: PT-To be determined, OT- 3 in 1 bedside comode, Tub/shower bench, SLP-  Equipment Details: PT- , OT-  Patient/family involved in discharge planning: PT- Patient,  OT-Patient, SLP-   MD ELOS: 19-20 days Medical Rehab Prognosis:  Excellent Assessment: The patient has been admitted for CIR therapies with the diagnosis of debility/gait disorder related to enteritis and multiple medical issues along with his MS. The team will be addressing functional mobility, strength, stamina, balance, safety, adaptive techniques and equipment, self-care, bowel and bladder mgt, patient and caregiver education, NMR, pain mgt, edema control, ego support, cognitive perceptual awareness. Goals have been set at supervision to min assist.  Ranelle Oyster, MD, FAAPMR  s    See Team Conference Notes for weekly updates to the plan of care

## 2014-10-08 NOTE — Consult Note (Signed)
  INITIAL DIAGNOSTIC EVALUATION - [facility]   MEDICAL NECESSITY:  Martin Powell was seen on the [facility] Rehabilitation Unit for an initial diagnostic evaluation owing to the patient's diagnosis of multiple sclerosis.   According to medical records, Mr. Martin Powell was admitted to the rehab unit owing to "Functional deficits secondary to pelvic fracture, deconditioning in MS patient related to C diff enteritis." Records also indicate that he is a "69 y.o. male with MS and chronic right sided weakness, chronic BLE wounds (treated recently with clindamycin) who was admitted 09/29/14 due to recurrent fall after multiple episodes of diarrhea. He was found hypotensive in his house by EMS and required multiple fluid boluses for BP stabilization. He has complaints of right pelvis/thigh after fall and X-rays with question of non-displaced right pubic symphysis fracture. He was found to have c diff enteritis with reactive leukocytosis and hypokalemia."    During today's visit, Martin Powell denied suffering from any cognitive changes. He said that he has been in good spirits lately and he has no history of mental health issues or treatment. However, he mentioned having "no friends" and said that he was pretty low after he was first diagnosed with multiple sclerosis and was forced to retire from [employer]. No adjustment issues endorsed regarding this admission. Suicidal/homicidal ideation, plan or intent was denied. No manic or hypomanic episodes were reported. The patient denied ever experiencing any auditory/visual hallucinations. No major behavioral or personality changes were endorsed.   Martin Powell feels that he is making progress in therapy. He described the rehab staff as "excellent." He currently lives alone but was having an aid come in 3 times per week to assist with medical necessities. He said that he has his sister as his primary support person and she can  help when necessary.   PROCEDURES ADMINISTERED: [1 unit 90791] Diagnostic clinical interview  Review of available records   SUMMARY & IMPRESSION: Overall, Martin Powell denied suffering from any major cognitive or emotionally difficulties. However, it was made clear that being diagnosed with MS nine years ago substantially changed his life in many ways. I spent time discussing the benefits of support groups and mentioned that MS support groups are specifically available. He said that he did not wish to pursue this option. For now, since he appears to be adjusting well to this admission, no formal follow-up from neuropsychology warranted unless requested by the patient or staff.    Martin Powell, Psy.D.  Clinical Neuropsychologist

## 2014-10-08 NOTE — Progress Notes (Signed)
Occupational Therapy Session Note  Patient Details  Name: Martin Powell MRN: [id number] Date of Birth: [birth year]  Today's Date: 10/08/2014 OT Individual Time: 1030-1130 and 1415-1445 OT Individual Time Calculation (min): 60 min and 30 min   Short Term Goals: Week 1:  OT Short Term Goal 1 (Week 1): Pt will perform toilet transfer with mod A in order to decrease level of assist needed for functional transfer. OT Short Term Goal 2 (Week 1): Pt will perform clothing management with min A standing balance. OT Short Term Goal 3 (Week 1): Pt will perform LB dressing with Mod A in order to increase I with self care. OT Short Term Goal 4 (Week 1): Pt will perform  UB dressing with set up A in order to increase I in self care.  Skilled Therapeutic Interventions/Progress Updates:    1) Engaged in therapeutic activity with focus on sit > stand, standing tolerance, and problem solving.  Pt in w/c upon arrival, reporting already dressed and declining bathing at this time.  Engaged in sit > stand at high-low table with focus on hand placement and increased upright standing posture.  Pt able to complete sit <> stand throughout session with supervision and cues to improve hand placement.  Pt tolerated standing 2-3 mins at a time before requiring seated rest break, one instance of standing for 6 mins. Pt required increased frequency of rest breaks as session went on.  Completed 3D pipe tree in standing with pt requiring mod cues for problem solving as pt unable to complete replication of picture and unable to correctly identify errors without cues.  Question cues progressed to more direct cues as activity progressed.  Pt reports difficulty with task but unable to identify reasons it was difficult.  2) Engaged in therapeutic activity with focus on functional mobility in home environment.  Pt able to recall seeing this therapist earlier today but unable to recall without question cues what we did.  In ADL apt, engaged  in functional mobility with RW with min assist and mod verbal cues for safety with RW and RW placement during mobility, maneuvering over thresholds, and opening refrigerator.  Pt unsafe with RW placement when reaching into refrigerator requiring cues to correct body positioning and placement of RW.  Pt required multiple seated rest breaks throughout session with 2 more attempts at short distance ambulation in ADL apt, pt able to tolerate 20 feet or less at a time this session.  Pt returned to room and left seated upright in w/c.  Therapy Documentation Precautions:  Precautions Precautions: Fall Precaution Comments: hx of MS, R LE weakness w/ R lateral lean, kyphotic posture and posterior lean Restrictions Weight Bearing Restrictions: No RLE Weight Bearing: Weight bearing as tolerated General:   Vital Signs: Therapy Vitals Temp: 98.2 F (36.8 C) Temp Source: Oral Pulse Rate: 78 Resp: 18 BP: 103/61 mmHg Patient Position (if appropriate): Sitting Oxygen Therapy SpO2: 95 % O2 Device: Not Delivered Pain:  Pt with no c/o pain  See FIM for current functional status  Therapy/Group: Individual Therapy  Rosalio Loud 10/08/2014, 3:09 PM

## 2014-10-08 NOTE — Progress Notes (Signed)
[place] PHYSICAL MEDICINE & REHABILITATION     PROGRESS NOTE    Subjective/Complaints: Had a good night. No new complaints  ROS: Pt denies fever, rash/itching, headache, blurred or double vision, nausea, vomiting, abdominal pain, diarrhea, chest pain, shortness of breath, palpitations, dysuria, dizziness, neck or back pain, bleeding, anxiety, or depression   Objective: Vital Signs: Blood pressure 137/83, pulse 100, temperature 98.3 F (36.8 C), temperature source Oral, resp. rate 18, SpO2 98 %. No results found.  Recent Labs  10/06/14 0535  WBC 10.9*  HGB 12.8*  HCT 41.0  PLT 208    Recent Labs  10/06/14 0535  NA 139  K 5.0  CL 102  GLUCOSE 86  BUN 15  CREATININE 1.09  CALCIUM 9.0   CBG (last 3)  No results for input(s): GLUCAP in the last 72 hours.  Wt Readings from Last 3 Encounters:  10/05/14 106.278 kg (234 lb 4.8 oz)  11/12/13 99.338 kg (219 lb)    Physical Exam:  Constitutional: He is oriented to person, place, and time. He appears well-developed and well-nourished.  HENT:  Head: Normocephalic and atraumatic.  Right Ear: External ear normal.  Left Ear: External ear normal.  Eyes: Conjunctivae are normal. Pupils are equal, round, and reactive to light. Right eye exhibits no discharge. Left eye exhibits no discharge.  Neck: Normal range of motion. Neck supple. No tracheal deviation present. No thyromegaly present.  Cardiovascular: Normal rate. An irregularly irregular rhythm is present.  .  No murmur heard. Respiratory: No respiratory distress. He has no wheezes. He has no rales.  GI: Soft. Bowel sounds are normal. He exhibits no distension. There is no tenderness. There is no rebound and no guarding.  Musculoskeletal: He exhibits edema (1+ pedal edema improving). Ongoing Pain in right thigh with SLR  Neurological: He is alert and oriented to person, place, and time.  Pt with reasonable insight and awareness. UE's 4/5 delt, tricep, bicep, wrist,  HI. LE's 2HF, 3-KE and 4- ankle df/pf. Mild sensory deficits to LT/PP in both feet. DTR's 2+ to 3+  Skin: Skin is warm and dry.  Unna boots on BLE --tight over dorsum of left foot Psychiatric: He has a normal mood and affect. His behavior is normal.   Assessment/Plan: 1. Functional deficits secondary to right inferior pubic ramus fracture, deconditioning after C diff enteritis which require 3+ hours per day of interdisciplinary therapy in a comprehensive inpatient rehab setting. Physiatrist is providing close team supervision and 24 hour management of active medical problems listed below. Physiatrist and rehab team continue to assess barriers to discharge/monitor patient progress toward functional and medical goals. FIM: FIM - Bathing Bathing Steps Patient Completed: Chest, Right Arm, Left Arm, Abdomen, Front perineal area, Buttocks Bathing: 4: Min-Patient completes 8-9 23f 10 parts or 75+ percent  FIM - Upper Body Dressing/Undressing Upper body dressing/undressing steps patient completed: Thread/unthread right sleeve of pullover shirt/dresss, Thread/unthread left sleeve of pullover shirt/dress, Put head through opening of pull over shirt/dress, Pull shirt over trunk Upper body dressing/undressing: 5: Set-up assist to: Obtain clothing/put away FIM - Lower Body Dressing/Undressing Lower body dressing/undressing steps patient completed: Thread/unthread right underwear leg, Thread/unthread left underwear leg, Thread/unthread right pants leg Lower body dressing/undressing: 2: Max-Patient completed 25-49% of tasks     FIM - Diplomatic Services operational officer Devices: Elevated toilet seat, Grab bars Toilet Transfers: 3-To toilet/BSC: Mod A (lift or lower assist), 3-From toilet/BSC: Mod A (lift or lower assist)  FIM - Bed/Chair Transfer Bed/Chair Transfer:  4: Supine > Sit: Min A (steadying Pt. > 75%/lift 1 leg), 4: Sit > Supine: Min A (steadying pt. > 75%/lift 1 leg), 2: Bed > Chair or  W/C: Max A (lift and lower assist), 2: Chair or W/C > Bed: Max A (lift and lower assist)  FIM - Locomotion: Wheelchair Distance: 50 Locomotion: Wheelchair: 2: Travels 50 - 149 ft with supervision, cueing or coaxing FIM - Locomotion: Ambulation Locomotion: Ambulation Assistive Devices: Designer, industrial/product Ambulation/Gait Assistance: 3: Mod assist, 2: Max assist Locomotion: Ambulation: 1: Travels less than 50 ft with maximal assistance (Pt: 25 - 49%)  Comprehension Comprehension Mode: Auditory Comprehension: 5-Follows basic conversation/direction: With no assist  Expression Expression Mode: Verbal Expression: 5-Expresses basic needs/ideas: With no assist  Social Interaction Social Interaction: 6-Interacts appropriately with others with medication or extra time (anti-anxiety, antidepressant).  Problem Solving Problem Solving: 4-Solves basic 75 - 89% of the time/requires cueing 10 - 24% of the time  Memory Memory: 3-Recognizes or recalls 50 - 74% of the time/requires cueing 25 - 49% of the time Medical Problem List and Plan: 1. Functional deficits secondary to pelvic fracture, deconditioning in MS patient related to C diff enteritis 2. DVT Prophylaxis/Anticoagulation: Pharmaceutical: Lovenox shall continue 3. Pain Management: Tramadol prn effective.  4. Mood: LCSW to follow for evaluation and support.  5. Neuropsych: This patient is capable of making decisions on his own behalf. 6. Stasis ulcers/Skin/Wound Care: Change Unna boots Tue/Thur  -left sided dressing adjusted for comfort 7. Fluids/Electrolytes/Nutrition: Monitor I/O. Continue supplements between meals.   8. C diff colitis: Vancomycin D # 7/14---thru 7/14---  -stools are more formed 9. Reactive leucocytosis: 10 MS with chronic right sided weakness: continue tecfidera 11. HTN: Monitor BP bid. On me 12. A fib: HR controlled. On amiodarone, BB and low dose ASA.  -hgb climbing    LOS (Days) 3 A FACE TO FACE EVALUATION  WAS PERFORMED  Lincoln Kleiner T 10/08/2014 9:11 AM

## 2014-10-09 ENCOUNTER — Encounter (HOSPITAL_COMMUNITY): Payer: Non-veteran care | Admitting: Occupational Therapy

## 2014-10-09 ENCOUNTER — Inpatient Hospital Stay (HOSPITAL_COMMUNITY): Payer: Non-veteran care | Admitting: Occupational Therapy

## 2014-10-09 ENCOUNTER — Inpatient Hospital Stay (HOSPITAL_COMMUNITY): Payer: Medicare Other | Admitting: *Deleted

## 2014-10-09 NOTE — Progress Notes (Signed)
[facility] PHYSICAL MEDICINE & REHABILITATION     PROGRESS NOTE    Subjective/Complaints: No problems overnight. Is frustrated that his balance isn't better.   ROS: Pt denies fever, rash/itching, headache, blurred or double vision, nausea, vomiting, abdominal pain, diarrhea, chest pain, shortness of breath, palpitations, dysuria, dizziness, neck or back pain, bleeding, anxiety, or depression   Objective: Vital Signs: Blood pressure 138/85, pulse 90, temperature 98 F (36.7 C), temperature source Oral, resp. rate 20, SpO2 99 %. No results found. No results for input(s): WBC, HGB, HCT, PLT in the last 72 hours. No results for input(s): NA, K, CL, GLUCOSE, BUN, CREATININE, CALCIUM in the last 72 hours.  Invalid input(s): CO CBG (last 3)  No results for input(s): GLUCAP in the last 72 hours.  Wt Readings from Last 3 Encounters:  10/05/14 106.278 kg (234 lb 4.8 oz)  11/12/13 99.338 kg (219 lb)    Physical Exam:  Constitutional: He is oriented to person, place, and time. He appears well-developed and well-nourished.  HENT:  Head: Normocephalic and atraumatic.  Right Ear: External ear normal.  Left Ear: External ear normal.  Eyes: Conjunctivae are normal. Pupils are equal, round, and reactive to light. Right eye exhibits no discharge. Left eye exhibits no discharge.  Neck: Normal range of motion. Neck supple. No tracheal deviation present. No thyromegaly present.  Cardiovascular: Normal rate. An irregularly irregular rhythm is present.  No murmur heard. Respiratory: No respiratory distress. He has no wheezes. He has no rales on exam GI: Soft. Bowel sounds are normal. He exhibits no distension. There is no tenderness. There is no rebound and no guarding.  Musculoskeletal: He exhibits edema (tr to 1+ pedal edema improving).less pain with movement of right leg  Neurological: He is alert and oriented to person, place, and time.  Pt with reasonable insight and awareness. UE's 4/5  delt, tricep, bicep, wrist, HI. LE's 2HF, 3-KE and 4- ankle df/pf. Mild sensory deficits to LT/PP in both feet. DTR's 2+ to 3+  Skin: Skin is warm and dry.  Unna boots on BLE appear to be fitting appropriately. Psychiatric: He has a normal mood and affect. His behavior is normal.   Assessment/Plan: 1. Functional deficits secondary to right inferior pubic ramus fracture, deconditioning after C diff enteritis which require 3+ hours per day of interdisciplinary therapy in a comprehensive inpatient rehab setting. Physiatrist is providing close team supervision and 24 hour management of active medical problems listed below. Physiatrist and rehab team continue to assess barriers to discharge/monitor patient progress toward functional and medical goals. FIM: FIM - Bathing Bathing Steps Patient Completed: Chest, Right Arm, Left Arm, Abdomen, Front perineal area, Buttocks Bathing: 4: Min-Patient completes 8-9 15f 10 parts or 75+ percent  FIM - Upper Body Dressing/Undressing Upper body dressing/undressing steps patient completed: Thread/unthread right sleeve of pullover shirt/dresss, Thread/unthread left sleeve of pullover shirt/dress, Put head through opening of pull over shirt/dress, Pull shirt over trunk Upper body dressing/undressing: 5: Set-up assist to: Obtain clothing/put away FIM - Lower Body Dressing/Undressing Lower body dressing/undressing steps patient completed: Thread/unthread right underwear leg, Thread/unthread left underwear leg, Thread/unthread right pants leg Lower body dressing/undressing: 2: Max-Patient completed 25-49% of tasks     FIM - Diplomatic Services operational officer Devices: Elevated toilet seat, Grab bars Toilet Transfers: 3-To toilet/BSC: Mod A (lift or lower assist), 3-From toilet/BSC: Mod A (lift or lower assist)  FIM - Bed/Chair Transfer Bed/Chair Transfer: 4: Supine > Sit: Min A (steadying Pt. > 75%/lift 1 leg), 4:  Sit > Supine: Min A (steadying pt. >  75%/lift 1 leg), 2: Bed > Chair or W/C: Max A (lift and lower assist), 2: Chair or W/C > Bed: Max A (lift and lower assist)  FIM - Locomotion: Wheelchair Distance: 50 Locomotion: Wheelchair: 2: Travels 50 - 149 ft with supervision, cueing or coaxing FIM - Locomotion: Ambulation Locomotion: Ambulation Assistive Devices: Designer, industrial/product Ambulation/Gait Assistance: 3: Mod assist, 2: Max assist Locomotion: Ambulation: 1: Travels less than 50 ft with maximal assistance (Pt: 25 - 49%)  Comprehension Comprehension Mode: Auditory Comprehension: 5-Follows basic conversation/direction: With no assist  Expression Expression Mode: Verbal Expression: 5-Expresses basic needs/ideas: With extra time/assistive device  Social Interaction Social Interaction: 4-Interacts appropriately 75 - 89% of the time - Needs redirection for appropriate language or to initiate interaction.  Problem Solving Problem Solving: 4-Solves basic 75 - 89% of the time/requires cueing 10 - 24% of the time  Memory Memory: 3-Recognizes or recalls 50 - 74% of the time/requires cueing 25 - 49% of the time Medical Problem List and Plan: 1. Functional deficits secondary to pelvic fracture, deconditioning in MS patient related to C diff enteritis 2. DVT Prophylaxis/Anticoagulation: Pharmaceutical: Lovenox shall continue 3. Pain Management: Tramadol prn effective.  4. Mood: LCSW to follow for evaluation and support.  5. Neuropsych: This patient is capable of making decisions on his own behalf. 6. Stasis ulcers/Skin/Wound Care: Change Unna boots Tue/Thur  -left sided dressing adjusted for comfort--overall TEDS fitting better 7. Fluids/Electrolytes/Nutrition: Monitor I/O. Continue supplements between meals.   8. C diff colitis: Vancomycin thru 7/14---  -stools are more formed/regular 9. Reactive leucocytosis: 10 MS with chronic right sided weakness: continue tecfidera 11. HTN: Monitor BP bid. On me 12. A fib: HR controlled.  On amiodarone, BB and low dose ASA.  -hgb trending up    LOS (Days) 4 A FACE TO FACE EVALUATION WAS PERFORMED  SWARTZ,ZACHARY T 10/09/2014 8:28 AM

## 2014-10-09 NOTE — Progress Notes (Signed)
Occupational Therapy Session Note  Patient Details  Name: Martin Powell MRN: [id number] Date of Birth: November 05, 1945  Today's Date: 10/09/2014 OT Individual Time: 0900-1000 and 1515-1554 OT Individual Time Calculation (min): 60 min and 39 min   Short Term Goals: Week 1:  OT Short Term Goal 1 (Week 1): Pt will perform toilet transfer with mod A in order to decrease level of assist needed for functional transfer. OT Short Term Goal 2 (Week 1): Pt will perform clothing management with min A standing balance. OT Short Term Goal 3 (Week 1): Pt will perform LB dressing with Mod A in order to increase I with self care. OT Short Term Goal 4 (Week 1): Pt will perform  UB dressing with set up A in order to increase I in self care.  Skilled Therapeutic Interventions/Progress Updates:    Session 1: Upon entering the room, pt supine in bed with RN finishing dressing B LEs wounds. Pt performed supine >sit with min A to EOB. Sit >stand from bed with lifting assistance and pt ambulating 10' to wheelchair with RW and min A. Pt seated in wheelchair for grooming tasks at sink. Clothing donned prior to session and pt declining further bathing and dressing. Pt did don and doff pull over shirt with supervision for shaving at sink side. Pt refusing to stand for grooming tasks. OT educated and demonstrated wheelchair push ups along with education regarding anterior weight shift to transfers and standing. Pt demonstrated strong posterior lean against bed earlier in session. Pt unable to verbalize set up of wheelchair and steps for sit <>stand without mod verbal cues and verbal guidance cues. Pt performing 2 sets of 10 wheelchair push ups to facilitate anterior weight shift, increase B UE strength, and endurance. Pt remained seated in wheelchair with call bell and all needed items within reach.   Session 2: Upon entering the room, pt seated in wheelchair with no c/o pain. Pt reporting he is very fatigued from day but agreeable to  session with coaxing. Pt propelled wheelchair 100' with use of B UEs and required increased time and rest break secondary to fatigue. Pt engaged in sit <> stand at table top for activity with pt standing for 8 min, 6 min, and 7 minutes respectively. Pt standing with close supervision during tasks and having one or no UE supported during task. Pt requiring max verbal cues for UNO game of "match color or number". Pt did not require cues for turn taking. Pt returning to room with total A from therapist via wheelchair. Pt electing to remain in wheelchair with call bell and all needed items within reach.   Therapy Documentation Precautions:  Precautions Precautions: Fall Precaution Comments: hx of MS, R LE weakness w/ R lateral lean, kyphotic posture and posterior lean Restrictions Weight Bearing Restrictions: No RLE Weight Bearing: Weight bearing as tolerated Pain: Pain Assessment Pain Score: 0-No pain  See FIM for current functional status  Therapy/Group: Individual Therapy  Lowella Grip 10/09/2014, 12:49 PM

## 2014-10-09 NOTE — Progress Notes (Signed)
Orthopedic Tech Progress Note Patient Details:  Martin Powell 1946/02/12 [id number]  Ortho Devices Type of Ortho Device: Roland Rack boot Ortho Device/Splint Location: bilateral Ortho Device/Splint Interventions: Application   Martin Powell 10/09/2014, 9:19 AM

## 2014-10-09 NOTE — Progress Notes (Signed)
Physical Therapy Session Note  Patient Details  Name: Martin Powell MRN: [id number] Date of Birth: 09-04-1945  Today's Date: 10/08/2014 PT Individual Time: 0900-1000 and 1300-1345 PT total time: 60 min and 45 min (total 105 min)  Short Term Goals: Week 1:  PT Short Term Goal 1 (Week 1): Pt will perform bed mobility with HOB flat and without rails at S level  PT Short Term Goal 2 (Week 1): Pt will perform sit<>stand consistently at min A level with min cues for technique PT Short Term Goal 3 (Week 1): Pt will perform dynamic standing balance with single UE support x 3-5 mins to increase independence with ADL's PT Short Term Goal 4 (Week 1): Pt will perform gait x 33' with LRAD at mod A level   Skilled Therapeutic Interventions/Progress Updates:    0900-1000 session: Pt received in w/c with no c/o pain and agreeable to treatment. Pt performed upper body dressing with supervision; lower body dressing with mina to A with threading LLE into shorts. Performed sit >stand and standing balance with RW with CGA to allow pt to pull pants up. In hallway pt performed gait for 4 trials of approximately 88' each with RW, standbyA and seated rest breaks between fatigue. Observed inc R foot drag when fatiguing, and pt given cues to A pt with self-recognition of fatigue and needing rest break for safety. Performed Nustep with BLE/BUE x10 min on level 6 with average 50 steps/min; activity performed to improve UE/LE strength as well as cardiovascular endurance for carryover into gait for functional home and community distances and adls. Pt returned to room in w/c with totalA; remained seated in w/c at completion of session with all needs within reach.  0865-7846 session: Pt received in w/c with no c/o pain and agreeable to treatment. Performed gait for 4 trials of approx. 30' each with RW and standbyA. Pt again requires cues for self-recognition of signs of fatigue. Pt requests to return to room to use restroom. Performed  gait with RW into bathroom with supervision and sit on raised toilet seat with use or grab bars. Pt given urinal per pt request to improve ease of urination; however pt urinated with lid still on causing pt to urinate on the floor. Accident cleaned with towels and pt returned to w/c with RW and CGA. Noted wet socks/unnas boots; nurse notified who reports that boots cannot be changed until tomorrow. Socks changed with maxA due to pt unable to reach B feet and tight fit of socks. Pt reports concern regarding color of toes; capillary refill checked at <2 sec, and pt states he is asymptomatic with no pain and normal sensation. Pt remained seated in w/c at completion of session with all needs within reach.   Therapy Documentation Precautions:  Precautions Precautions: Fall Precaution Comments: hx of MS, R LE weakness w/ R lateral lean, kyphotic posture and posterior lean Restrictions Weight Bearing Restrictions: No RLE Weight Bearing: Weight bearing as tolerated Pain: Pt reports no c/o pain  See FIM for current functional status  Therapy/Group: Individual Therapy  Vista Lawman 10/09/2014, 10:07 AM

## 2014-10-09 NOTE — Progress Notes (Signed)
Physical Therapy Session Note  Patient Details  Name: Martin Powell MRN: [id number] Date of Birth: 10/12/1945  Today's Date: 10/09/2014     Skilled Therapeutic Interventions/Progress Updates:  Session I 1015-1100 (45 min ) Patient in w/c agrees to therapy interventions, no complains of pain or discomfort reported. Instruction on w/c propulsion to room ,cues to facilitate use of B UE and LE and hand over hand demonstration to increase efficiency by elongating stride. NuStep 1 x 10 min resistance level of 6.o in order to increase muscle strength and facilitate coordination and reciprocal patterns.  Gait  Training and instruction with FWW 3 x 20 feet with min A and manual facilitation of R Foot placement and clearance during swing through. Patient presents with increased forward flexion and poor knee and hip extension. Noticeable R side neglect and difficulty in self correction ,verbal and tactile cues necessary to correct gait pattern. Due to R foot drop, ace wrap applied as a toe off device  and has shown slight improvement in gait and increased step length.  Patient returned to room with min A for propulsion back, all needs within reach.   Session II 1300-1345 (45 min )  Second session today focused on strength training with 3 lbs on B ankles for exercises in supine and sitting ,including : SLR 2 x 15 Bridging 2 x 15 SAQ 2 x15,  Blue thera-band resisted ankle pumps and in sitting hip abd also with thera band resistance. In supine patient guided through active stretching to B Hip flexors, and abd as well as B pectoral muscle stretches , all performed in order to increase postural control in standing.  Standing activities to improve balance and activity tolerance in , II bars patient performed step ups on foam with manually guided weight shift and WB through R and L ,alternating, weight shifting R and L with min A and manual guidance.  Training in sit to stand w/o use of UE 2 x 5 with no break between  and manual facilitation of hip extension by input through gluts. Patient propelled w/c back to room with min A and left in w/c with family present ,all needs within reach.   Therapy Documentation Precautions:  Precautions Precautions: Fall Precaution Comments: hx of MS, R LE weakness w/ R lateral lean, kyphotic posture and posterior lean Restrictions Weight Bearing Restrictions: No RLE Weight Bearing: Weight bearing as tolerated Vital Signs: Therapy Vitals Temp: 97.7 F (36.5 C) Temp Source: Oral Pulse Rate: 92 Resp: 18 BP: 125/75 mmHg Patient Position (if appropriate): Sitting Oxygen Therapy SpO2: 97 % O2 Device: Not Delivered  See FIM for current functional status  Therapy/Group: Individual Therapy  Dorna Mai 10/09/2014, 4:10 PM

## 2014-10-10 ENCOUNTER — Inpatient Hospital Stay (HOSPITAL_COMMUNITY): Payer: Medicare Other | Admitting: *Deleted

## 2014-10-10 ENCOUNTER — Inpatient Hospital Stay (HOSPITAL_COMMUNITY): Payer: Medicare Other | Admitting: Occupational Therapy

## 2014-10-10 NOTE — Progress Notes (Signed)
[place] PHYSICAL MEDICINE & REHABILITATION     PROGRESS NOTE    Subjective/Complaints: Again denies issues. Pain better.    ROS: Pt denies fever, rash/itching, headache, blurred or double vision, nausea, vomiting, abdominal pain, diarrhea, chest pain, shortness of breath, palpitations, dysuria, dizziness, neck or back pain, bleeding, anxiety, or depression   Objective: Vital Signs: Blood pressure 144/97, pulse 72, temperature 97.6 F (36.4 C), temperature source Oral, resp. rate 18, SpO2 98 %. No results found. No results for input(s): WBC, HGB, HCT, PLT in the last 72 hours. No results for input(s): NA, K, CL, GLUCOSE, BUN, CREATININE, CALCIUM in the last 72 hours.  Invalid input(s): CO CBG (last 3)  No results for input(s): GLUCAP in the last 72 hours.  Wt Readings from Last 3 Encounters:  10/05/14 106.278 kg (234 lb 4.8 oz)  11/12/13 99.338 kg (219 lb)    Physical Exam:  Constitutional: He is oriented to person, place, and time. He appears well-developed and well-nourished.  HENT:  Head: Normocephalic and atraumatic.  Right Ear: External ear normal.  Left Ear: External ear normal.  Eyes: Conjunctivae are normal. Pupils are equal, round, and reactive to light. Right eye exhibits no discharge. Left eye exhibits no discharge.  Neck: Normal range of motion. Neck supple. No tracheal deviation present. No thyromegaly present.  Cardiovascular: Normal rate. An irregularly irregular rhythm is present.  No murmur heard. Respiratory: No respiratory distress. He has no wheezes. He has no rales on exam GI: Soft. Bowel sounds are normal. He exhibits no distension. There is no tenderness. There is no rebound and no guarding.  Musculoskeletal: He exhibits edema (tr to 1+ pedal edema improving).less pain with movement of right leg  Neurological: He is alert and oriented to person, place, and time.  Pt with reasonable insight and awareness. UE's 4/5 delt, tricep, bicep, wrist, HI.  LE's 2HF, 3-KE and 4- ankle df/pf. Mild sensory deficits to LT/PP in both feet. DTR's 2+ to 3+  Skin: Skin is warm and dry.  Unna boots on BLE appear to be fitting appropriately. Psychiatric: He has a normal mood and affect. His behavior is normal.   Assessment/Plan: 1. Functional deficits secondary to right inferior pubic ramus fracture, deconditioning after C diff enteritis which require 3+ hours per day of interdisciplinary therapy in a comprehensive inpatient rehab setting. Physiatrist is providing close team supervision and 24 hour management of active medical problems listed below. Physiatrist and rehab team continue to assess barriers to discharge/monitor patient progress toward functional and medical goals. FIM: FIM - Bathing Bathing Steps Patient Completed: Chest, Right Arm, Left Arm, Abdomen, Front perineal area, Buttocks Bathing: 4: Min-Patient completes 8-9 70f 10 parts or 75+ percent  FIM - Upper Body Dressing/Undressing Upper body dressing/undressing steps patient completed: Thread/unthread right sleeve of pullover shirt/dresss, Thread/unthread left sleeve of pullover shirt/dress, Put head through opening of pull over shirt/dress, Pull shirt over trunk Upper body dressing/undressing: 5: Supervision: Safety issues/verbal cues FIM - Lower Body Dressing/Undressing Lower body dressing/undressing steps patient completed: Thread/unthread right underwear leg, Thread/unthread left underwear leg, Thread/unthread right pants leg Lower body dressing/undressing: 2: Max-Patient completed 25-49% of tasks     FIM - Diplomatic Services operational officer Devices: Elevated toilet seat, Grab bars Toilet Transfers: 3-To toilet/BSC: Mod A (lift or lower assist), 3-From toilet/BSC: Mod A (lift or lower assist)  FIM - Bed/Chair Transfer Bed/Chair Transfer: 4: Supine > Sit: Min A (steadying Pt. > 75%/lift 1 leg), 4: Sit > Supine: Min A (steadying  pt. > 75%/lift 1 leg), 3: Bed > Chair or W/C:  Mod A (lift or lower assist)  FIM - Locomotion: Wheelchair Distance: 50 Locomotion: Wheelchair: 2: Travels 50 - 149 ft with supervision, cueing or coaxing FIM - Locomotion: Ambulation Locomotion: Ambulation Assistive Devices: Designer, industrial/product Ambulation/Gait Assistance: 3: Mod assist, 2: Max assist Locomotion: Ambulation: 1: Travels less than 50 ft with maximal assistance (Pt: 25 - 49%)  Comprehension Comprehension Mode: Auditory Comprehension: 5-Understands complex 90% of the time/Cues < 10% of the time  Expression Expression Mode: Verbal Expression: 5-Expresses basic needs/ideas: With extra time/assistive device  Social Interaction Social Interaction: 4-Interacts appropriately 75 - 89% of the time - Needs redirection for appropriate language or to initiate interaction.  Problem Solving Problem Solving: 4-Solves basic 75 - 89% of the time/requires cueing 10 - 24% of the time  Memory Memory: 3-Recognizes or recalls 50 - 74% of the time/requires cueing 25 - 49% of the time Medical Problem List and Plan: 1. Functional deficits secondary to pelvic fracture, deconditioning in MS patient related to C diff enteritis 2. DVT Prophylaxis/Anticoagulation: Pharmaceutical: Lovenox shall continue until mobility improved 3. Pain Management: Tramadol prn effective.  4. Mood: LCSW to follow for evaluation and support.  5. Neuropsych: This patient is capable of making decisions on his own behalf. 6. Stasis ulcers/Skin/Wound Care: Change Unna boots Tue/Thur  -left sided dressing adjusted for comfort--overall TEDS fitting better, edema is decreasing 7. Fluids/Electrolytes/Nutrition: Monitor I/O. Continue supplements between meals.   8. C diff colitis: Vancomycin thru 7/14---  -stools are more formed/regular 9. Reactive leucocytosis: 10 MS with chronic right sided weakness: continue tecfidera 11. HTN: Monitor BP bid. On me 12. A fib: HR controlled. On amiodarone, BB and low dose ASA.  -hgb  trending up    LOS (Days) 5 A FACE TO FACE EVALUATION WAS PERFORMED  Edi Gorniak T 10/10/2014 8:37 AM

## 2014-10-10 NOTE — Progress Notes (Signed)
Occupational Therapy Session Note  Patient Details  Name: Martin Powell MRN: [id number] Date of Birth: 12-Dec-1945  Today's Date: 10/10/2014 OT Individual Time: 1135-1205 OT Individual Time Calculation (min): 30 min   Skilled Therapeutic Interventions/Progress Updates: Patient participated in doffing socks with legs crossed, sit to stand, standing trunk stability, &  thoracic extension at sink and sitting in chair as well as Upper body/extremity endurance & strenghthing to increase overall condititioning & transfer status.     Therapy Documentation Precautions:  Precautions Precautions: Fall Precaution Comments: hx of MS, R LE weakness w/ R lateral lean, kyphotic posture and posterior lean Restrictions Weight Bearing Restrictions: No RLE Weight Bearing: Weight bearing as tolerated  Pain:  See FIM for current functional status  Therapy/Group: Individual Therapy  [name] [facility] 10/10/2014, 4:09 PM

## 2014-10-10 NOTE — Progress Notes (Signed)
Physical Therapy Session Note  Patient Details  Name: Martin Powell MRN: [id number] Date of Birth: 10-Jan-1946  Today's Date: 10/10/2014 PT Individual Time: 1255-1355 PT Individual Time Calculation (min): 60 min     Skilled Therapeutic Interventions/Progress Updates:  Patient in room , agrees to therapy intervention, no complains of pain or discomfort. W/c propulsion to gym. NuStep 1 x 10 min with resistance level on 4.0 in order to improve strength an coordination.  Sit to stand transfers with holding 2 lbs weighted bar in order to reduce dependency on UE for transfers.  Patient completed 2 x 5 reps with min to mod A. Manual facilitation of hip extension and postural correction.  Therapeutic exercises in supine: bridging 2 x 10 , SLR 2 x10 -patient requests return to room to use restroom, transfer with mod A, patient reports dizziness, transported back to bathroom, min A pull to stand to transfer to the commode, mod A for hygiene and donning Lower body clothing. Patient VS taken, no acute changes, left in room sitting in w/c. Nurse notified about complains of dizziness.    Therapy Documentation Precautions:  Precautions Precautions: Fall Precaution Comments: hx of MS, R LE weakness w/ R lateral lean, kyphotic posture and posterior lean Restrictions Weight Bearing Restrictions: No RLE Weight Bearing: Weight bearing as tolerated Vital Signs: Therapy Vitals Pulse Rate: (!) 102 BP: (!) 141/81 mmHg Patient Position (if appropriate): Sitting Oxygen Therapy SpO2: 99 % O2 Device: Not Delivered  See FIM for current functional status  Therapy/Group: Individual Therapy  Dorna Mai 10/10/2014, 2:02 PM

## 2014-10-11 ENCOUNTER — Encounter (HOSPITAL_COMMUNITY): Payer: Non-veteran care

## 2014-10-11 ENCOUNTER — Inpatient Hospital Stay (HOSPITAL_COMMUNITY): Payer: Medicare Other | Admitting: Occupational Therapy

## 2014-10-11 ENCOUNTER — Inpatient Hospital Stay (HOSPITAL_COMMUNITY): Payer: Non-veteran care | Admitting: Physical Therapy

## 2014-10-11 NOTE — Progress Notes (Signed)
Occupational Therapy Session Note  Patient Details  Name: Oriel Rumbold MRN: [id number] Date of Birth: 11/17/1945  Today's Date: 10/11/2014 OT Individual Time: 0800-0900 OT Individual Time Calculation (min): 60 min    Short Term Goals: Week 1:  OT Short Term Goal 1 (Week 1): Pt will perform toilet transfer with mod A in order to decrease level of assist needed for functional transfer. OT Short Term Goal 2 (Week 1): Pt will perform clothing management with min A standing balance. OT Short Term Goal 3 (Week 1): Pt will perform LB dressing with Mod A in order to increase I with self care. OT Short Term Goal 4 (Week 1): Pt will perform  UB dressing with set up A in order to increase I in self care.  Skilled Therapeutic Interventions/Progress Updates:    Pt seen for BADL retraining with a focus on activity tolerance and standing balance.  Pt received in w/c. Good awareness of checking to make sure brakes are locked prior to standing. Pt ambulated 12 feet from w/c to [facility] over toilet with RW with close S. Pt completed toileting with S. A to fasten hospital briefs. Pt then ambulated to room to complete grooming at sink and continue dressing from w/c. Donned pants with close S, A with shoes.  Pt did well standing up and down without assist and no leaning, but did need cues to extend his legs as his knees would flex with fatigue.  Once self care was completed, pt worked on AROM exercises for LEs to increase RLE strength and reduce edema in LLE.  Discussed d/c plans and pt is considering AL for the future but wants to go home first to see how he will do.  Pt resting in wc with all needs met.   Therapy Documentation Precautions:  Precautions Precautions: Fall Precaution Comments: hx of MS, R LE weakness w/ R lateral lean, kyphotic posture and posterior lean Restrictions Weight Bearing Restrictions: No RLE Weight Bearing: Weight bearing as tolerated (R leg rotates externally)    Vital Signs: Therapy  Vitals Temp: 97.7 F (36.5 C) Temp Source: Oral Pulse Rate: 66 Resp: 16 BP: 136/88 mmHg Patient Position (if appropriate): Lying Oxygen Therapy SpO2: 99 % O2 Device: Not Delivered Pain: Pain Assessment Pain Assessment: No/denies pain ADL:  See FIM for current functional status  Therapy/Group: Individual Therapy  Machael Raine 10/11/2014, 8:28 AM

## 2014-10-11 NOTE — Progress Notes (Signed)
Orthopedic Tech Progress Note Patient Details:  [name] [birth year] [id number]  Patient ID: Martin Powell, male   DOB: 1945/08/19, 69 y.o.   MRN: [id number]   [name] 10/11/2014, 1:24 PMBilateral unna wraps

## 2014-10-11 NOTE — Progress Notes (Signed)
Physical Therapy Session Note  Patient Details  Name: Martin Powell MRN: [id number] Date of Birth: 1946/02/07  Today's Date: 10/11/2014 PT Individual Time: 1000-1100 and 1400-1515 PT Individual Time Calculation (min): 60 min and 75 min (total 135 min)  Short Term Goals: Week 1:  PT Short Term Goal 1 (Week 1): Pt will perform bed mobility with HOB flat and without rails at S level  PT Short Term Goal 2 (Week 1): Pt will perform sit<>stand consistently at min A level with min cues for technique PT Short Term Goal 3 (Week 1): Pt will perform dynamic standing balance with single UE support x 3-5 mins to increase independence with ADL's PT Short Term Goal 4 (Week 1): Pt will perform gait x 48' with LRAD at mod A level   Skilled Therapeutic Interventions/Progress Updates:    1000-1100 session: Pt received in w/c with c/o pain as described below; agreeable to treatment. Performed w/c propulsion from room to therapy gym approximately 175 ft with supervision, occasional cues for L side attention and avoiding obstacles. Pt performs with slow speed and difficulty with navigating corners. Performed Nustep on level 4 x10 min with average 30 steps/min with BLE/BUE and one rest break due to fatigue. Pt ambulated approx 10 feet from nustep to mat table with RW and minA. On edge of mat table, pt instructed in sit <> stand while performing UE reaching activities and cog task of matching cards to board. Pt requires minA for sit <>stand due to poor forward weight shifting and kyphotic posture making full upright posture difficult for pt to achieve independently; cued for glute facilitation and hip extension. Required min cues for matching cards; unsure if due to impaired scanning, poor visibility of board due to posture, or cognitive impairment. Initial trials performed with sit <>stand between each card, later trials performed with pt remaining standing and performing turn/reach to take cards from mat table. Occasional  LOB with turning/reaching and pt sat abruptly without warning with poor eccentric control. Pt returned to w/c from mat table with stand pivot transfer with minA/CGA. Returned to room with totalA d/t time constraints; remained seated in w/c at completion of session with all needs within reach.  [id number] session: Pt received seated in w/c with no c/o pain and agreeable to treatment. Performed w/c propulsion with BUEs to improve aerobic endurance for carryover into gait and adls. Requires max verbal/tactile cues to perform short radius turn with w/c (UEs pushing in opposite directions); turning strategy introduced due to pt tendency to run into walls/objects on L side and needing a more efficient way to correct direction. In therapy gym pt instructed in ascent/descent of 8 3" stairs with minA x2 trials. Required short standing rest break at top of stairway, and extended seated rest break after each trial due to fatigue. Pt requires cues for hand placement to prevent posterior bias and LOB on both ascent and descent, and poor carryover of correction between trials. Pt instructed in w/c <> bed transfer in simulated apartment, with minA and verbal cues required to stand from bed due to compliant surface and poor forward weight shifting. All other bed mobility performed with supervision and appropriate safety awareness. Performed furniture transfer from couch in apartment stand pivot transfer with RW; required modA due to low seat height and improper body mechanics with poor forward weight shift. Performed w/c <> car transfer with RW and minA. Cues required for safe/appropriate hand placement and to ensure pt turns completely prior to sitting. Throughout session pt  frequently begins sitting before completing turn and requires mod/max verbal and tactile cues to prevent early sitting on arm rest and safety awareness. Pt returned to room with totalA due to time constraints and pt fatigue. Pt remained seated in w/c at  completion of session with all needs within reach.   Therapy Documentation Precautions:  Precautions Precautions: Fall Precaution Comments: hx of MS, R LE weakness w/ R lateral lean, kyphotic posture and posterior lean Restrictions Weight Bearing Restrictions: No RLE Weight Bearing: Weight bearing as tolerated (R leg rotates externally) Pain: Pain Assessment Pain Assessment: 0-10 Pain Score: 2  Pain Type: Chronic pain Pain Location: Foot Pain Orientation: Right Pain Descriptors / Indicators: Aching Pain Onset: On-going Patients Stated Pain Goal: 0 Pain Intervention(s): Ambulation/increased activity Multiple Pain Sites: No Locomotion : Wheelchair Mobility Distance: 125   See FIM for current functional status  Therapy/Group: Individual Therapy  Vista Lawman 10/11/2014, 11:31 AM

## 2014-10-11 NOTE — Progress Notes (Signed)
Nursing Note: Pt bladder scanned after back from bathroom voiding and was scanned for 310 cc.No cath required till volume is >350 cc.Pt voids often in urinal and in bathroom.wbb

## 2014-10-11 NOTE — Progress Notes (Signed)
69 y.o. male with MS and chronic right sided weakness, chronic BLE wounds (treated recently with clindamycin) who was admitted 09/29/14 due to recurrent fall after multiple episodes of diarrhea. He was found hypotensive in his house by EMS and required multiple fluid boluses for BP stabilization. He has complaints of right pelvis/thigh after fall and Xrays with question of nondisplaced right pubic symphysis fracture   [place] PHYSICAL MEDICINE & REHABILITATION     PROGRESS NOTE    Subjective/Complaints: No pains, slept ok, "diarrhea has cleared up" Voiding but needs assist with urinal  Had exercise attendant at home PTA as well as a RN- prob HH   ROS:  No CP/SOB, no nausea , vomiting Diarrhea, no pains, + weak in RLE   Objective: Vital Signs: Blood pressure 136/88, pulse 66, temperature 97.7 F (36.5 C), temperature source Oral, resp. rate 16, SpO2 99 %. No results found. No results for input(s): WBC, HGB, HCT, PLT in the last 72 hours. No results for input(s): NA, K, CL, GLUCOSE, BUN, CREATININE, CALCIUM in the last 72 hours.  Invalid input(s): CO CBG (last 3)  No results for input(s): GLUCAP in the last 72 hours.  Wt Readings from Last 3 Encounters:  10/05/14 106.278 kg (234 lb 4.8 oz)  11/12/13 99.338 kg (219 lb)    Physical Exam:  Constitutional: He is oriented to person, place, and time. He appears well-developed and well-nourished.  HENT:  Head: Normocephalic and atraumatic.   Eyes: Conjunctivae are normal. Pupils are equal, round, and reactive to light. Right eye exhibits no discharge. Left eye exhibits no discharge.  Neck: Normal range of motion. Neck supple. No tracheal deviation present. No thyromegaly present.  Cardiovascular: Normal rate. An irregularly irregular rhythm is present.  No murmur heard. Respiratory: No respiratory distress. He has no wheezes. He has no rales on exam GI: Soft. Bowel sounds are normal. He exhibits no distension. There is no  tenderness. There is no rebound and no guarding.  Musculoskeletal: He exhibits edema (1+ R and 2+ Left pedal edema).less pain with movement of right leg  Neurological: He is alert and oriented to person, place, and time.  Pt with reasonable insight and awareness. UE's 4/5 delt, tricep, bicep, wrist, HI. RIght LE's 2HF, 3-KE and 3- ankle df/pf, 4- on left throughout LLE . Cannot eval sensory in feet due to unna boots         Skin: Skin is warm and dry.  Unna boots on BLE appear to be fitting appropriately. Psychiatric: He has a normal mood and affect. His behavior is normal.   Assessment/Plan: 1. Functional deficits secondary to right inferior pubic ramus fracture, deconditioning after C diff enteritis which require 3+ hours per day of interdisciplinary therapy in a comprehensive inpatient rehab setting. Physiatrist is providing close team supervision and 24 hour management of active medical problems listed below. Physiatrist and rehab team continue to assess barriers to discharge/monitor patient progress toward functional and medical goals. FIM: FIM - Bathing Bathing Steps Patient Completed: Chest, Right Arm, Left Arm, Abdomen, Front perineal area, Buttocks Bathing: 4: Min-Patient completes 8-9 22f 10 parts or 75+ percent  FIM - Upper Body Dressing/Undressing Upper body dressing/undressing steps patient completed: Thread/unthread right sleeve of pullover shirt/dresss, Thread/unthread left sleeve of pullover shirt/dress, Put head through opening of pull over shirt/dress, Pull shirt over trunk Upper body dressing/undressing: 5: Supervision: Safety issues/verbal cues FIM - Lower Body Dressing/Undressing Lower body dressing/undressing steps patient completed: Thread/unthread right underwear leg, Thread/unthread left underwear leg,  Thread/unthread right pants leg Lower body dressing/undressing: 2: Max-Patient completed 25-49% of tasks  FIM - Toileting Toileting steps completed by patient: Performs  perineal hygiene, Adjust clothing after toileting, Adjust clothing prior to toileting Toileting: 5: Supervision: Safety issues/verbal cues  FIM - Archivist Transfers Assistive Devices: Art gallery manager Transfers: 5-To toilet/BSC: Supervision (verbal cues/safety issues), 5-From toilet/BSC: Supervision (verbal cues/safety issues)  FIM - Games developer Transfer: 5: Bed > Chair or W/C: Supervision (verbal cues/safety issues), 5: Chair or W/C > Bed: Supervision (verbal cues/safety issues)  FIM - Locomotion: Wheelchair Distance: 50 Locomotion: Wheelchair: 2: Travels 50 - 149 ft with supervision, cueing or coaxing FIM - Locomotion: Ambulation Locomotion: Ambulation Assistive Devices: Designer, industrial/product Ambulation/Gait Assistance: 3: Mod assist, 2: Max assist Locomotion: Ambulation: 1: Travels less than 50 ft with maximal assistance (Pt: 25 - 49%)  Comprehension Comprehension Mode: Auditory Comprehension: 5-Understands complex 90% of the time/Cues < 10% of the time  Expression Expression Mode: Verbal Expression: 5-Expresses basic needs/ideas: With no assist  Social Interaction Social Interaction: 4-Interacts appropriately 75 - 89% of the time - Needs redirection for appropriate language or to initiate interaction.  Problem Solving Problem Solving: 4-Solves basic 75 - 89% of the time/requires cueing 10 - 24% of the time  Memory Memory: 4-Recognizes or recalls 75 - 89% of the time/requires cueing 10 - 24% of the time Medical Problem List and Plan: 1. Functional deficits secondary to pelvic fracture, deconditioning in MS patient related to C diff enteritis 2. DVT Prophylaxis/Anticoagulation: Pharmaceutical: Lovenox shall continue until mobility improved 3. Pain Management: Tramadol prn effective.  4. Mood: LCSW to follow for evaluation and support.  5. Neuropsych: This patient is capable of making decisions on his own behalf. 6. Stasis ulcers/Skin/Wound Care:  Change Unna boots Tue/Thur, had HH RN PTA  -left sided dressing adjusted for comfort--overall TEDS fitting better, edema is decreasing 7. Fluids/Electrolytes/Nutrition: Monitor I/O. Continue supplements between meals.   8. C diff colitis: Vancomycin thru 7/14---  -diarrhea resolving 9. Reactive leucocytosis: 10 MS with chronic right sided weakness: continue tecfidera 11. HTN: Monitor BP bid.  12. A fib: HR controlled. On amiodarone, BB and low dose ASA.  -hgb trending up    LOS (Days) 6 A FACE TO FACE EVALUATION WAS PERFORMED  KIRSTEINS,ANDREW E 10/11/2014 7:04 AM

## 2014-10-12 ENCOUNTER — Inpatient Hospital Stay (HOSPITAL_COMMUNITY): Payer: Medicare Other | Admitting: Occupational Therapy

## 2014-10-12 ENCOUNTER — Inpatient Hospital Stay (HOSPITAL_COMMUNITY): Payer: Medicare Other | Admitting: Physical Therapy

## 2014-10-12 LAB — BASIC METABOLIC PANEL
Anion gap: 7 (ref 5–15)
BUN: 14 mg/dL (ref 6–20)
CO2: 28 mmol/L (ref 22–32)
Calcium: 8.8 mg/dL — ABNORMAL LOW (ref 8.9–10.3)
Chloride: 102 mmol/L (ref 101–111)
Creatinine, Ser: 1.11 mg/dL (ref 0.61–1.24)
GFR calc Af Amer: >60 mL/min (ref >60)
GLUCOSE: 103 mg/dL — AB (ref 65–99)
Potassium: 3.8 mmol/L (ref 3.5–5.1)
Sodium: 137 mmol/L (ref 135–145)

## 2014-10-12 LAB — CREATININE, SERUM
Creatinine, Ser: 1.09 mg/dL (ref 0.61–1.24)
GFR calc Af Amer: >60 mL/min (ref >60)

## 2014-10-12 NOTE — Progress Notes (Signed)
Occupational Therapy Weekly Progress Note  Patient Details  Name: Martin Powell MRN: [id number] Date of Birth: 10/30/1945  Beginning of progress report period: October 06, 2014 End of progress report period: October 12, 2014  Today's Date: 10/12/2014 OT Individual Time: 0900-1000 OT Individual Time Calculation (min): 60 min    Patient has met 4 of 4 short term goals.  Pt has progressed well with his activity tolerance and mobility to complete sit to stands and ambulate with RW a short distance with S to allow him to be more I with his self care.  Patient continues to demonstrate the following deficits: decreased standing balance and activity tolerance and therefore will continue to benefit from skilled OT intervention to enhance overall performance with BADL and iADL.  Patient progressing toward long term goals..  Plan of care revisions: Toileting and toilet transfer upgraded from min A to supervision..  OT Short Term Goals Week 1:  OT Short Term Goal 1 (Week 1): Pt will perform toilet transfer with mod A in order to decrease level of assist needed for functional transfer. OT Short Term Goal 1 - Progress (Week 1): Met OT Short Term Goal 2 (Week 1): Pt will perform clothing management with min A standing balance. OT Short Term Goal 2 - Progress (Week 1): Met OT Short Term Goal 3 (Week 1): Pt will perform LB dressing with Mod A in order to increase I with self care. OT Short Term Goal 3 - Progress (Week 1): Met OT Short Term Goal 4 (Week 1): Pt will perform  UB dressing with set up A in order to increase I in self care. OT Short Term Goal 4 - Progress (Week 1): Met Week 2:  OT Short Term Goal 1 (Week 2): Pt will don shorts, socks, and shoes with min A. OT Short Term Goal 2 (Week 2): Pt will complete toilet transfers with RW with close S. OT Short Term Goal 3 (Week 2): Pt will toilet with S.  Skilled Therapeutic Interventions/Progress Updates:    Pt seen for ADL retraining with a focus on  activity tolerance, standing balance and use of AE.  Pt received sitting in recliner, he ambulated with RW to chair at sink.  Sat down to complete grooming and UB bathing, stood with close S and support on sink for LB bathing and to pull pants up. Used reacher to assist with donning briefs over feet, did not need for pants. Needs A to don velcro ortho slippers. Attempted with reacher but too difficult to pull straps tight. Pt states he uses slip on bedroom shoes at home, but he can not use right now with coban wrapping around his feet. Pt tolerated standing for 2-3 minutes then legs became fatigued. He did propel self in w/c to gym and ambulated several steps with RW to therapy mat.  On mat, worked on controlled stand to sit 10x using 1 hand for support reaching to mat. UE postural strengthening with Level 3 theraband with arm horizontal abduction 12x. Encouraged pt to continue exercises later today. His PT arrived for his next session.  Therapy Documentation Precautions:  Precautions Precautions: Fall Precaution Comments: hx of MS, R LE weakness w/ R lateral lean, kyphotic posture and posterior lean Restrictions Weight Bearing Restrictions: No RLE Weight Bearing: Weight bearing as tolerated     Pain: Pain Assessment Pain Assessment: No/denies pain ADL:  See FIM for current functional status  Therapy/Group: Individual Therapy  SAGUIER,JULIA 10/12/2014, 10:14 AM   

## 2014-10-12 NOTE — Progress Notes (Signed)
PHYSICAL MEDICINE & REHABILITATION     PROGRESS NOTE    Subjective/Complaints: Still weak No further diarrhea Denies problems during therapy, "good workout" yesterday Unna boots changed ber ortho tech yesterday  ROS:  No CP/SOB, no nausea , vomiting Diarrhea, no pains, + weak in RLE   Objective: Vital Signs: Blood pressure 143/82, pulse 71, temperature 97.8 F (36.6 C), temperature source Oral, resp. rate 17, SpO2 99 %. No results found. No results for input(s): WBC, HGB, HCT, PLT in the last 72 hours. No results for input(s): NA, K, CL, GLUCOSE, BUN, CREATININE, CALCIUM in the last 72 hours.  Invalid input(s): CO CBG (last 3)  No results for input(s): GLUCAP in the last 72 hours.  Wt Readings from Last 3 Encounters:  10/05/14 106.278 kg (234 lb 4.8 oz)  11/12/13 99.338 kg (219 lb)    Physical Exam:  Constitutional: He is oriented to person, place, and time. He appears well-developed and well-nourished.  HENT:  Head: Normocephalic and atraumatic.   Eyes: Conjunctivae are normal. Pupils are equal, round, and reactive to light. Right eye exhibits no discharge. Left eye exhibits no discharge.  Neck: Normal range of motion. Neck supple. No tracheal deviation present. No thyromegaly present.  Cardiovascular: Normal rate. An irregularly irregular rhythm is present.  No murmur heard. Respiratory: No respiratory distress. He has no wheezes. He has no rales on exam GI: Soft. Bowel sounds are normal. He exhibits no distension. There is no tenderness. There is no rebound and no guarding.  Musculoskeletal: He exhibits edema (1+ R and 2+ Left pedal edema).less pain with movement of right leg  Neurological: He is alert and oriented to person, place, and time.  Pt with reasonable insight and awareness. UE's 4/5 delt, tricep, bicep, wrist, HI. RIght LE's 2HF, 3-KE and 3- ankle df/pf, 4- on left throughout LLE . Cannot eval sensory in feet due to unna boots         Skin:  Skin is warm and dry.  Unna boots on BLE appear to be fitting appropriately. Psychiatric: He has a normal mood and affect. His behavior is normal.   Assessment/Plan: 1. Functional deficits secondary to right inferior pubic ramus fracture, deconditioning after C diff enteritis which require 3+ hours per day of interdisciplinary therapy in a comprehensive inpatient rehab setting. Physiatrist is providing close team supervision and 24 hour management of active medical problems listed below. Physiatrist and rehab team continue to assess barriers to discharge/monitor patient progress toward functional and medical goals. FIM: FIM - Bathing Bathing Steps Patient Completed: Chest, Right Arm, Left Arm, Abdomen, Front perineal area, Buttocks Bathing: 5: Supervision: Safety issues/verbal cues  FIM - Upper Body Dressing/Undressing Upper body dressing/undressing steps patient completed: Thread/unthread right sleeve of pullover shirt/dresss, Thread/unthread left sleeve of pullover shirt/dress, Put head through opening of pull over shirt/dress, Pull shirt over trunk Upper body dressing/undressing: 5: Set-up assist to: Obtain clothing/put away FIM - Lower Body Dressing/Undressing Lower body dressing/undressing steps patient completed: Thread/unthread right pants leg, Thread/unthread left pants leg, Pull pants up/down (used brief) Lower body dressing/undressing: 3: Mod-Patient completed 50-74% of tasks  FIM - Toileting Toileting steps completed by patient: Performs perineal hygiene, Adjust clothing after toileting, Adjust clothing prior to toileting Toileting: 5: Supervision: Safety issues/verbal cues  FIM - Diplomatic Services operational officer Devices: Environmental consultant, Therapist, music Transfers: 5-To toilet/BSC: Supervision (verbal cues/safety issues), 5-From toilet/BSC: Supervision (verbal cues/safety issues)  FIM - Bed/Chair Transfer Bed/Chair Transfer: 5: Supine > Sit:  Supervision (verbal cues/safety  issues), 5: Sit > Supine: Supervision (verbal cues/safety issues), 4: Bed > Chair or W/C: Min A (steadying Pt. > 75%), 4: Chair or W/C > Bed: Min A (steadying Pt. > 75%)  FIM - Locomotion: Wheelchair Distance: 125 Locomotion: Wheelchair: 2: Travels 50 - 149 ft with supervision, cueing or coaxing FIM - Locomotion: Ambulation Locomotion: Ambulation Assistive Devices: Designer, industrial/product Ambulation/Gait Assistance: 4: Min assist Locomotion: Ambulation: 1: Travels less than 50 ft with minimal assistance (Pt.>75%)  Comprehension Comprehension Mode: Auditory Comprehension: 5-Understands complex 90% of the time/Cues < 10% of the time  Expression Expression Mode: Verbal Expression: 5-Expresses basic needs/ideas: With no assist  Social Interaction Social Interaction: 4-Interacts appropriately 75 - 89% of the time - Needs redirection for appropriate language or to initiate interaction.  Problem Solving Problem Solving: 4-Solves basic 75 - 89% of the time/requires cueing 10 - 24% of the time  Memory Memory: 4-Recognizes or recalls 75 - 89% of the time/requires cueing 10 - 24% of the time Medical Problem List and Plan: 1. Functional deficits secondary to pelvic fracture, deconditioning in MS patient related to C diff enteritis 2. DVT Prophylaxis/Anticoagulation: Pharmaceutical: Lovenox shall continue until mobility improved 3. Pain Management: Tramadol prn effective.  4. Mood: LCSW to follow for evaluation and support.  5. Neuropsych: This patient is capable of making decisions on his own behalf. 6. Stasis ulcers/Skin/Wound Care: Change Unna boots twice a week, had HH RN PTA  -left sided dressing adjusted for comfort--, edema is decreasing 7. Fluids/Electrolytes/Nutrition: Monitor I/O. Continue supplements between meals.   8. C diff colitis: Vancomycin thru 7/14---  -diarrhea resolving 9. Reactive leucocytosis: 10 MS with chronic right sided weakness: continue tecfidera 11. HTN: Monitor  BP bid.  12. A fib: HR controlled. On amiodarone, BB and low dose ASA.   LOS (Days) 7 A FACE TO FACE EVALUATION WAS PERFORMED  Martin Powell 10/12/2014 7:13 AM

## 2014-10-12 NOTE — Progress Notes (Signed)
Physical Therapy Session Note  Patient Details  Name: Martin Powell MRN: [id number] Date of Birth: 01-07-1946  Today's Date: 10/12/2014 PT Individual Time: 1000-1100 and 1330-1445 PT Individual Time Calculation (min): 60 min and 75 min (total 135 min)  Short Term Goals: Week 1:  PT Short Term Goal 1 (Week 1): Pt will perform bed mobility with HOB flat and without rails at S level  PT Short Term Goal 2 (Week 1): Pt will perform sit<>stand consistently at min A level with min cues for technique PT Short Term Goal 3 (Week 1): Pt will perform dynamic standing balance with single UE support x 3-5 mins to increase independence with ADL's PT Short Term Goal 4 (Week 1): Pt will perform gait x 77' with LRAD at mod A level   Skilled Therapeutic Interventions/Progress Updates:    1000-1100 session: Pt received seated on edge of mat table at completion of previous treatment session. Pt expresses interest in focusing session on gait training as he feels he is weakest in this area. Performed gait x8 trials for approximately 30-50 ft each with RW and minA//CGA; w/c follow for convenience, and seated rest breaks between trials. Pt requires repetitive multimodal cues for upright posture, keeping RW closer to body, safety prior to sitting. Pt often has uncontrolled descent for stand>sit and frequently sits prior to announcing that he needs to rest before w/c has been locked. Pt also responds to education regarding safety with "I know", however does not demonstrate carryover of safety instructions into later trials. Pt reports LE fatigue at completion of gait training, and states he is frustrated with his performance. Pt educated in slow progress expected following prolonged hospitalization, and given encouragement with discussion of progress already made. Performed w/c propulsion >150 ft with supervision back to pt room; performed to allow LE rest break as well as aerobic endurance activity. Pt remained seated in w/c  with all needs within reach at completion of session.   [id number] session: Pt received seated in w/c; no c/o pain and agreeable to treatment. Pt transported to day room with totalA. Performed standing at elevated table x2 trials for approx 7 min each while engaged in game. Pt required multiple cues and demonstrations for appropriate play of game due to impaired recall of directions and impaired processing. Pt returned to room to use restroom; performed use of urinal with supervision. Performed hand washing at sink while seated in w/c with supervision. Pt propelled w/c to therapy gym with BUEs and supervision, with occasional minA due to difficulty with obstacle navigation. Performed Nustep x12 min on level 5 with average 32 steps per min; performed to improve aerobic endurance for carryover into gait for functional community distances. NMR performed due to impaired standing balance; attempted to perform standing balance without RW and stepping to targets, however upon achieving standing position pt demonstrates difficulty weight shifting in order to perform step, stating "I can't do it, maybe tomorrow". Pt taken to parallel bars to improve confidence and safety with dynamic balance tasks. Initially had pt stand with UE support on bars, practicing weight shifts and toe taps. Educated pt on importance of three systems (vestib, sensory, vision) for efficient/effective balance. Pt demonstrates ability to stand without UE support, as well as without UE support and eyes closed, and eyes open with narrow BOS with close supervision. Inc difficulty with weight shifts and toe tapping when UE support reduced or eliminated. Pt additionally demonstrates inc forward trunk/hip flexion with advanced balance challenges, despite cues. Pt instructed in  sideways walking with light UE support on parallel bars; performed to improve both LE/hip strength as well as weight shifting to bilat feet. Pt returned to room with totalA due to  time constraints. Performed stand pivot 180 degree transfer w/c > recliner with minA/CGA and poor eccentric control during stand >sit. Pt remained seated in recliner with NA present at completion of session.   Therapy Documentation Precautions:  Precautions Precautions: Fall Precaution Comments: hx of MS, R LE weakness w/ R lateral lean, kyphotic posture and posterior lean Restrictions Weight Bearing Restrictions: No RLE Weight Bearing: Weight bearing as tolerated Pain: Pain Assessment Pain Assessment: No/denies pain Pain Score: 0-No pain Locomotion : Ambulation Ambulation/Gait Assistance: 4: Min guard Wheelchair Mobility Distance: 150   See FIM for current functional status  Therapy/Group: Individual Therapy  Vista Lawman 10/12/2014, 12:54 PM

## 2014-10-13 ENCOUNTER — Inpatient Hospital Stay (HOSPITAL_COMMUNITY): Payer: Medicare Other | Admitting: Occupational Therapy

## 2014-10-13 ENCOUNTER — Inpatient Hospital Stay (HOSPITAL_COMMUNITY): Payer: Medicare Other | Admitting: Physical Therapy

## 2014-10-13 ENCOUNTER — Inpatient Hospital Stay (HOSPITAL_COMMUNITY): Payer: Medicare Other

## 2014-10-13 MED ORDER — NON FORMULARY: 240.0000 mg | Freq: Two times a day (BID) | Status: DC

## 2014-10-13 MED ORDER — BOOST / RESOURCE BREEZE PO LIQD
1.0000 | Freq: Every day | ORAL | Status: DC
  Administered 2014-10-13 – 2014-10-18 (×6): 1 via ORAL

## 2014-10-13 MED ORDER — DIMETHYL FUMARATE 240 MG PO CPDR: 240.0000 mg | DELAYED_RELEASE_CAPSULE | Freq: Two times a day (BID) | ORAL | Status: DC

## 2014-10-13 MED ORDER — ENSURE ENLIVE PO LIQD
237.0000 mL | Freq: Every day | ORAL | Status: DC
  Administered 2014-10-14 – 2014-10-19 (×6): 237 mL via ORAL

## 2014-10-13 NOTE — Progress Notes (Signed)
Physical Therapy Weekly Progress Note  Patient Details  Name: Martin Powell MRN: [id number] Date of Birth: 03/24/1946  Beginning of progress report period: October 06, 2014 End of progress report period: October 13, 2014  Today's Date: 10/13/2014 PT Individual Time: 1000-1028 PT Individual Time Calculation (min): 28 min   Patient has met 3 of 4 short term goals. Unmet goal includes dynamic standing balance with UE activity for 3-5 min, unable to perform due to limited activity tolerance and poor balance. Pt is minA/CGA overall due to poor balance, strength, and limited endurance. Severely limited in endurance and requires extended seated rest breaks after 2-3 min of activity.   Patient continues to demonstrate the following deficits: postural abnormalities, impaired balance, limited activity tolerance, strength deficits and therefore will continue to benefit from skilled PT intervention to enhance overall performance with activity tolerance, balance, postural control, ability to compensate for deficits, attention and awareness.  Patient progressing toward long term goals..  Continue plan of care.  PT Short Term Goals Week 1:  PT Short Term Goal 1 (Week 1): Pt will perform bed mobility with HOB flat and without rails at S level  PT Short Term Goal 1 - Progress (Week 1): Met PT Short Term Goal 2 (Week 1): Pt will perform sit<>stand consistently at min A level with min cues for technique PT Short Term Goal 2 - Progress (Week 1): Met PT Short Term Goal 3 (Week 1): Pt will perform dynamic standing balance with single UE support x 3-5 mins to increase independence with ADL's PT Short Term Goal 3 - Progress (Week 1): Not met PT Short Term Goal 4 (Week 1): Pt will perform gait x 9' with LRAD at mod A level  PT Short Term Goal 4 - Progress (Week 1): Met  Skilled Therapeutic Interventions/Progress Updates:     Pt received in w/c with no c/o pain and agreeable to treatment. Discussed with pt need to begin  inc gait distance as previous session pt has only been ambulating 30-50 ft before rest break, which is borderline not functional even for home distances. After discussion, pt ambulated 2 trials for 150' with RW and min guard. Requires occasional cues for keeping RW closer to body and upright posture. Performed standing balance in room; 3 x 10sec of normal BOS eyes open, normal BOS eyes closed, narrow BOS eyes open, narrow BOS eyes closed. Demonstrates inc sway in all conditions with eyes closed, requiring use of UEs on RW to regain balance and occasional minA from therapist for safety. Remained seated in w/c with all needs within reach at completion of session.   Therapy Documentation Precautions:  Precautions Precautions: Fall Precaution Comments: hx of MS, R LE weakness w/ R lateral lean, kyphotic posture and posterior lean Restrictions Weight Bearing Restrictions: No RLE Weight Bearing: Weight bearing as tolerated  Pain: Pain Assessment Pain Assessment: No/denies pain Pain Score: 0-No pain Locomotion : Ambulation Ambulation/Gait Assistance: 4: Min guard   See FIM for current functional status  Therapy/Group: Individual Therapy  Luberta Mutter 10/13/2014, 12:31 PM

## 2014-10-13 NOTE — Progress Notes (Signed)
Occupational Therapy Session Note  Patient Details  Name: Martin Powell MRN: [id number] Date of Birth: Jun 27, 1945  Today's Date: 10/13/2014 OT Individual Time: 0800-0900 and 1100-1130 OT Individual Time Calculation (min): 60 min  and 30 min   Short Term Goals: Week 1:  OT Short Term Goal 1 (Week 1): Pt will perform toilet transfer with mod A in order to decrease level of assist needed for functional transfer. OT Short Term Goal 1 - Progress (Week 1): Met OT Short Term Goal 2 (Week 1): Pt will perform clothing management with min A standing balance. OT Short Term Goal 2 - Progress (Week 1): Met OT Short Term Goal 3 (Week 1): Pt will perform LB dressing with Mod A in order to increase I with self care. OT Short Term Goal 3 - Progress (Week 1): Met OT Short Term Goal 4 (Week 1): Pt will perform  UB dressing with set up A in order to increase I in self care. OT Short Term Goal 4 - Progress (Week 1): Met Week 2:  OT Short Term Goal 1 (Week 2): Pt will don shorts, socks, and shoes with min A. OT Short Term Goal 2 (Week 2): Pt will complete toilet transfers with RW with close S. OT Short Term Goal 3 (Week 2): Pt will toilet with S.  Skilled Therapeutic Interventions/Progress Updates:    Visit 1: No c/o pain. Pt seen this session for BADL retraining with a focus on problem solving, standing balance, using RW more actively during session.  Pt received sitting in recliner. Pt states he slept in recliner all night and his legs were stiff. AROM of BLE to prepare for standing. Pt stood to walker to ambulate to closet to retreive clothing. Good placement of walker to reach into closet.  He then ambulated to toilet to complete self care routine sitting on toilet. Unable to manage socks over thick coban wrapping himself, needs A with velcro ortho shoes.  Ambulated to sink to groom in standing at sink. Pt very kyphotic and has great difficulty achieving full upright position but no LOB. Sat in chair and was  taken to ADL apt to review how pt prepares food at home. He usually uses microwave meals or makes sandwiches.  He uses the seat of his rollator to move food and plates from counter to table. Discussed that rollator may not be the safest option vs. The RW.  Explained and showed picture to pt of tray that can attach to RW.  Pt practiced ambulating around kitchen to retrieve frozen meal from freezer, place in microwave and set microwave time.  He practiced retreiving items from fridge and cabinets with close supervision and good awareness of walker placement with reaching. Due to decreased activity tolerance, weak LE and decreased dynamic balance, pt should always have someone with him when he is doing his meal prep. Overall, pt close supervision with meal prep. Pt returned to  room with all needs met.  Visit 2:  No c/o pain. Pt seen this session to discuss discharge plans and to work on dynamic standing balance with less reliance on UE support. Pt states he is on a waiting list at an ALF.  In the meantime, he plans to continue to have the three caregivers [facility]) and will ask his sister to spend the nights with him.  Reviewed need for BSC to use over toilet as pt only has standard toilet and will need height and arm rails to support him in sit>< stand. Pt  may also benefit from tub bench if stepping in and out of tub is too difficult.  Pt transported to gym to work on dynamic standing balance, pt stood to RW and worked on reaching forward, L and R out of his BOS to hang objects over head. Pt did well standing with one hand support on RW with no LOB. He was able to stand momentarily with no UE support. Reviewed safe stand to sit as pt sat 2x without reaching back fully. Pt was then able to return demonstrate stand to sit safely 2x. Pt returned to room with all needs met.  Therapy Documentation Precautions:  Precautions Precautions: Fall Precaution Comments: hx of MS, R LE weakness w/ R lateral lean, kyphotic  posture and posterior lean Restrictions Weight Bearing Restrictions: No RLE Weight Bearing: Weight bearing as tolerated   Pain: Pain Assessment Pain Assessment: No/denies pain ADL:   See FIM for current functional status  Therapy/Group: Individual Therapy  Charmeka Freeburg 10/13/2014, 9:18 AM

## 2014-10-13 NOTE — Progress Notes (Signed)
Physical Therapy Session Note  Patient Details  Name: Martin Powell MRN: [id number] Date of Birth: [birth year]  Today's Date: 10/13/2014 PT Individual Time: 1300-1415 PT Individual Time Calculation (min): 75 min   Short Term Goals: Week 2:  PT Short Term Goal 1 (Week 2): Will perform bed mobility at modI level without rails PT Short Term Goal 2 (Week 2): Will perform transfer w/c <> bed with consistant CGA and RW PT Short Term Goal 3 (Week 2): Will perform standing dynamic balance with single UE support x3-5 min to inc indpendence with adls PT Short Term Goal 4 (Week 2): Will perform gait x150 with supervision and RW  Skilled Therapeutic Interventions/Progress Updates:     Pt's sister stated that pt had multiple falls at home before getting sick; pt concurred, and stated he would lose his balance backwards.   w/c propulsion using bil UEs x 150' with supervision, cues to correct for drifting L.  W/c> mat to R stand pivot with RW, min assist, mod cues for technique. Pt had bladder urgency, and urinal brought but pt unable to urinate in sitting.  neuromuscular re-education via positioning, forced use, manual cues and VCs for: -self stretching bil soleus muscles in sitting and bil gastroc muscles in standing, both on wedge.  Pt tolerated standing x 1 minute on wedge with bil UE support, before needing to sit due to RLe weakness. -standing in // with bil UE support for balance retraining, with facilitation for trunk and hip ext, during wt shifting with toe raises, L><R.  Pt able to perform 1/2 range functional toe raises L, but not R.  With min assist to extend R knee, pt able to initiate R toe raise.  Gait with Rw x 52' with min assist, and PT bringing w/c along as pt has very little warning before sitting.  Pt returned to room to rest in w/c; he stated he would call NT to transfer to recliner. All needs left within reach.  Therapy Documentation Precautions:  Precautions Precautions:  Fall Precaution Comments: hx of MS, R LE weakness w/ R lateral lean, kyphotic posture and posterior lean Restrictions Weight Bearing Restrictions: No RLE Weight Bearing: Weight bearing as tolerated   Pain: Pain Assessment Pain Assessment: No/denies pain      See FIM for current functional status  Therapy/Group: Individual Therapy  Augustin Bun 10/13/2014, 3:59 PM

## 2014-10-13 NOTE — Progress Notes (Deleted)
Physical Therapy Session Note  Patient Details  Name: Martin Powell MRN: [id number] Date of Birth: 31-Mar-1946  Today's Date: 10/13/2014 PT Individual Time: 1000-1028 PT Individual Time Calculation (min): 28 min   Short Term Goals: Week 2:     Skilled Therapeutic Interventions/Progress Updates:    Pt received in w/c with no c/o pain and agreeable to treatment. Discussed with pt need to begin inc gait distance as previous session pt has only been ambulating 30-50 ft before rest break, which is borderline not functional even for home distances. After discussion, pt ambulated 2 trials for 150' with RW and min guard. Requires occasional cues for keeping RW closer to body and upright posture. Performed standing balance in room; 3 x 10sec of normal BOS eyes open, normal BOS eyes closed, narrow BOS eyes open, narrow BOS eyes closed. Demonstrates inc sway in all conditions with eyes closed, requiring use of UEs on RW to regain balance and occasional minA from therapist for safety. Remained seated in w/c with all needs within reach at completion of session.   Therapy Documentation Precautions:  Precautions Precautions: Fall Precaution Comments: hx of MS, R LE weakness w/ R lateral lean, kyphotic posture and posterior lean Restrictions Weight Bearing Restrictions: No RLE Weight Bearing: Weight bearing as tolerated  Pain: Pain Assessment Pain Assessment: No/denies pain Pain Score: 0-No pain Locomotion : Ambulation Ambulation/Gait Assistance: 4: Min guard   See FIM for current functional status  Therapy/Group: Individual Therapy  Vista Lawman 10/13/2014, 10:37 AM

## 2014-10-13 NOTE — Progress Notes (Signed)
Nutrition Follow-up  DOCUMENTATION CODES:   Not applicable  INTERVENTION:   Provide Ensure Enlive po once daily, each supplement provides 350 kcal and 20 grams of protein.  Provide Boost Breeze po once daily, each supplement provides 250 kcal and 9 grams of protein.  Encourage adequate PO intake.   NUTRITION DIAGNOSIS:   Increased nutrient needs related to wound healing as evidenced by estimated needs; ongoing  GOAL:   Patient will meet greater than or equal to 90% of their needs; met  MONITOR:   PO intake, Supplement acceptance, Weight trends, Labs, I & O's  REASON FOR ASSESSMENT:   Malnutrition Screening Tool    ASSESSMENT:   Pt with MS and chronic right sided weakness, chronic BLE wounds (treated recently with clindamycin) who was admitted 09/29/14 due to recurrent fall after multiple episodes of diarrhea. Pt found to have c diff enteritis with reactive leucocytosis and hypokalemia. Conservative carefor pelvic fracture. Unna boots placed on BLE ulcers.   Meal completion has been 75-100%. Pt has been consuming his Ensure, however reports he does prefer Boost Breeze better. RD to modify orders.   Labs and medications reviewed.   Diet Order:  Diet Heart Room service appropriate?: Yes; Fluid consistency:: Thin  Skin:  Wound (see comment) (Stage II pressure ulcer on L heel, +1 LE edema)  Last BM:  7/12  Height:   Ht Readings from Last 1 Encounters:  09/30/14 _0  (1.905 m)    Weight:   Wt Readings from Last 1 Encounters:  10/13/14 241 lb 6.5 oz (109.5 kg)    Ideal Body Weight:  89 kg  Wt Readings from Last 10 Encounters:  10/13/14 241 lb 6.5 oz (109.5 kg)  10/05/14 234 lb 4.8 oz (106.278 kg)  11/12/13 219 lb (99.338 kg)    BMI:  Body mass index is 30.17 kg/(m^2).  Estimated Nutritional Needs:   Kcal:  2200-2400  Protein:  115-130 grams  Fluid:  2.2 - 2.4 L/day  EDUCATION NEEDS:   No education needs identified at this time  Corrin Parker, MS, RD, LDN Pager # [phone] After hours/ weekend pager # [phone]

## 2014-10-13 NOTE — Progress Notes (Signed)
[facility] PHYSICAL MEDICINE & REHABILITATION     PROGRESS NOTE    Subjective/Complaints: No further diarrhea Walking was tough yesterday   ROS:  No CP/SOB, no nausea , vomiting Diarrhea, no limb or back pain, + weak in RLE>>LLE   Objective: Vital Signs: Blood pressure 158/82, pulse 77, temperature 97.7 F (36.5 C), temperature source Oral, resp. rate 18, weight 109.5 kg (241 lb 6.5 oz), SpO2 98 %. No results found. No results for input(s): WBC, HGB, HCT, PLT in the last 72 hours.  Recent Labs  10/12/14 0640 10/12/14 1711  NA  --  137  K  --  3.8  CL  --  102  GLUCOSE  --  103*  BUN  --  14  CREATININE 1.09 1.11  CALCIUM  --  8.8*   CBG (last 3)  No results for input(s): GLUCAP in the last 72 hours.  Wt Readings from Last 3 Encounters:  10/13/14 109.5 kg (241 lb 6.5 oz)  10/05/14 106.278 kg (234 lb 4.8 oz)  11/12/13 99.338 kg (219 lb)    Physical Exam:  Constitutional: He is oriented to person, place, and time. He appears well-developed and well-nourished.  HENT:  Head: Normocephalic and atraumatic.   Eyes: Conjunctivae are normal. Pupils are equal, round, and reactive to light. Right eye exhibits no discharge. Left eye exhibits no discharge.  Neck: Normal range of motion. Neck supple. No tracheal deviation present. No thyromegaly present.  Cardiovascular: Normal rate. An irregularly irregular rhythm is present.  No murmur heard. Respiratory: No respiratory distress. He has no wheezes. He has no rales on exam GI: Soft. Bowel sounds are normal. He exhibits no distension. There is no tenderness. There is no rebound and no guarding.  Musculoskeletal: He exhibits edema (1+ R and 2+ Left pedal edema).less pain with movement of right leg  Neurological: He is alert and oriented to person, place, and time.  Pt with reasonable insight and awareness. UE's 4/5 delt, tricep, bicep, wrist, HI. RIght LE's 2HF, 3-KE and 3- ankle df/pf, 4- on left throughout LLE . Cannot  eval sensory in feet due to unna boots         Skin: Skin is warm and dry.  Unna boots on BLE appear to be fitting appropriately. Psychiatric: He has a normal mood and affect. His behavior is normal.   Assessment/Plan: 1. Functional deficits secondary to right inferior pubic ramus fracture, deconditioning after C diff enteritis which require 3+ hours per day of interdisciplinary therapy in a comprehensive inpatient rehab setting. Physiatrist is providing close team supervision and 24 hour management of active medical problems listed below. Physiatrist and rehab team continue to assess barriers to discharge/monitor patient progress toward functional and medical goals. Team conference today please see physician documentation under team conference tab, met with team face-to-face to discuss problems,progress, and goals. Formulized individual treatment plan based on medical history, underlying problem and comorbidities. FIM: FIM - Bathing Bathing Steps Patient Completed: Chest, Right Arm, Left Arm, Abdomen, Front perineal area, Buttocks, Right upper leg, Left upper leg Bathing: 5: Supervision: Safety issues/verbal cues  FIM - Upper Body Dressing/Undressing Upper body dressing/undressing steps patient completed: Thread/unthread right sleeve of pullover shirt/dresss, Thread/unthread left sleeve of pullover shirt/dress, Put head through opening of pull over shirt/dress, Pull shirt over trunk Upper body dressing/undressing: 5: Set-up assist to: Obtain clothing/put away FIM - Lower Body Dressing/Undressing Lower body dressing/undressing steps patient completed: Thread/unthread right pants leg, Thread/unthread left pants leg, Pull pants up/down,  Thread/unthread right underwear leg, Thread/unthread left underwear leg, Pull underwear up/down Lower body dressing/undressing: 3: Mod-Patient completed 50-74% of tasks  FIM - Toileting Toileting steps completed by patient: Performs perineal hygiene, Adjust  clothing after toileting, Adjust clothing prior to toileting Toileting: 5: Supervision: Safety issues/verbal cues  FIM - Radio producer Devices: Environmental consultant, Product manager Transfers: 5-To toilet/BSC: Supervision (verbal cues/safety issues), 5-From toilet/BSC: Supervision (verbal cues/safety issues)  FIM - IT sales professional Transfer: 5: Supine > Sit: Supervision (verbal cues/safety issues), 5: Sit > Supine: Supervision (verbal cues/safety issues), 4: Bed > Chair or W/C: Min A (steadying Pt. > 75%), 4: Chair or W/C > Bed: Min A (steadying Pt. > 75%)  FIM - Locomotion: Wheelchair Distance: 150 Locomotion: Wheelchair: 5: Travels 150 ft or more: maneuvers on rugs and over door sills with supervision, cueing or coaxing FIM - Locomotion: Ambulation Locomotion: Ambulation Assistive Devices: Administrator Ambulation/Gait Assistance: 4: Min guard Locomotion: Ambulation: 1: Travels less than 50 ft with minimal assistance (Pt.>75%)  Comprehension Comprehension Mode: Auditory Comprehension: 5-Understands complex 90% of the time/Cues < 10% of the time  Expression Expression Mode: Verbal Expression: 5-Expresses basic needs/ideas: With no assist  Social Interaction Social Interaction: 4-Interacts appropriately 75 - 89% of the time - Needs redirection for appropriate language or to initiate interaction.  Problem Solving Problem Solving: 4-Solves basic 75 - 89% of the time/requires cueing 10 - 24% of the time  Memory Memory: 4-Recognizes or recalls 75 - 89% of the time/requires cueing 10 - 24% of the time Medical Problem List and Plan: 1. Functional deficits secondary to pelvic fracture, deconditioning in MS patient related to C diff enteritis 2. DVT Prophylaxis/Anticoagulation: Pharmaceutical: Lovenox shall continue until mobility improved 3. Pain Management: Tramadol prn effective.  4. Mood: LCSW to follow for evaluation and support.  5. Neuropsych:  This patient is capable of making decisions on his own behalf. 6. Stasis ulcers/Skin/Wound Care: Change Unna boots twice a week, had HH RN PTA  -left sided dressing adjusted for comfort--, edema is decreasing 7. Fluids/Electrolytes/Nutrition: Monitor I/O. Continue supplements between meals.   8. C diff colitis: Vancomycin thru 7/14---  -diarrhea resolved 9. Reactive leucocytosis: 10 MS with chronic right sided weakness: continue tecfidera 11. HTN: Monitor BP bid.  12. A fib: HR controlled. On amiodarone, BB and low dose ASA.   LOS (Days) 8 A FACE TO FACE EVALUATION WAS PERFORMED  Charlett Blake 10/13/2014 7:45 AM

## 2014-10-14 ENCOUNTER — Inpatient Hospital Stay (HOSPITAL_COMMUNITY): Payer: Medicare Other

## 2014-10-14 ENCOUNTER — Inpatient Hospital Stay (HOSPITAL_COMMUNITY): Payer: Non-veteran care | Admitting: Physical Therapy

## 2014-10-14 NOTE — Progress Notes (Signed)
Occupational Therapy Session Note  Patient Details  Name: Martin Powell MRN: [id number] Date of Birth: 24-Jan-1946  Today's Date: 10/14/2014 OT Individual Time: 0700-0800 and 1430-1500 OT Individual Time Calculation (min): 60 min and 30 min    Short Term Goals: Week 2:  OT Short Term Goal 1 (Week 2): Pt will don shorts, socks, and shoes with min A. OT Short Term Goal 2 (Week 2): Pt will complete toilet transfers with RW with close S. OT Short Term Goal 3 (Week 2): Pt will toilet with S.  Skilled Therapeutic Interventions/Progress Updates:    Session 1: Pt seen for 1:1 OT session with a focus on functional ambulation, sit<>stand, ADL retraining, dynamic standing balance, UE strengthening, and activity tolerance. Pt received supine in bed agreeable to participate in therapy session. Pt completed bed mobility with supervision and went EOB>stand via RW with min A. Pt ambulated to bathroom with close supervision and completed toileting with steadying A. Pt then ambulated to w/c to complete B/D sit<>stand at sink level. Pt completed bathing with supervision and UB dressing with set-up assist. Pt completed LB dressing with mod A predominantly due to thick coban wrapping on lower leg. Pt then propelled w/c apprx 10' at very slow rate before fatigue. Pt transferred to therapy gym to complete dynamic standing balance activity to facilitate upright posture. Activity required pt to reach out of BOS and place items on a vertical surface and pt completed with SBA. Activity graded to complete with foam balance pad and pt able to maintain balance with steadying A. Pt then engaged in 10 STS while reaching to touch overhead. Pt demonstrated mod fatigue with transitional movements and required mod cues for reaching back & controlled sit during descent.  Pt then engaged in 3 sets of theraband activities seated EOM: horizontal abduction, shoulder flexion, elbow flexion. Pt left seated in w/c with breakfast and all other  needs within reach.   Session 2: Pt seen for 1:1 OT session with a focus on functional mobility, sit<>stand, standing balance, functional transfers, activity tolerance, and safety awareness. Pt received seated in recliner agreeable to participate in session. Pt completed sit>stand with heavy min A likely due to prolonged sitting. Pt ambulated to sink via RW to complete grooming with steadying A. Pt then propelled to ADL apartment and engaged in functional transfer simulation via RW to tub bench. Pt demonstrated decreased problem-solving for body mechanics during task but able to complete with mod cues. Equipment malfunction occurred so task was terminated, and would be good to practice in future sessions. Pt then engaged in dynamic standing balance of reaching to vertical surface in kitchen for apprx 5 min. Pt demonstrated increased upright posture during task with steadying A. Pt then transferred back to room and ambulated via RW to recliner with steadying A. Pt left seated in recliner with all needs in reach.   Therapy Documentation Precautions:  Precautions Precautions: Fall Precaution Comments: hx of MS, R LE weakness w/ R lateral lean, kyphotic posture and posterior lean Restrictions Weight Bearing Restrictions: No RLE Weight Bearing: Weight bearing as tolerated General:   Vital Signs:  Pain: Pain Assessment Pain Assessment: No/denies pain Pain Score: 0-No pain ADL:   Exercises:   Other Treatments:    See FIM for current functional status  Therapy/Group: Individual Therapy  Alger Memos 10/14/2014, 11:37 AM

## 2014-10-14 NOTE — Consult Note (Signed)
Neuropsychology Psychosocial - [facility] Rehabilitation _____________________________________________________________________________   Mr. Martin Powell is a 69 year old man who was seen for a follow-up neuropsychological evaluation today to provide supportive psychotherapy given recommendation from the treatment team.    Today, Mr. Martin Powell described his mood as "pretty good" citing physical limitations as his greatest stressor.  However, he reported remaining motivated for therapy and denied major concerns, worries, or current symptoms of depression.  In fact, he said that he feels encouraged and hopeful about his future.  He stated that he typically copes by relying on support from his family.  Mr. Martin Powell denied problems with sleep or appetite.    Mr. Martin Powell was provided with psychoeducation regarding risks for development of depression and/or cognitive dysfunction in the setting of Multiple Sclerosis and he appeared to understand.  He agreed to inform his care team members should he develop any symptoms of depression or cognitive problems.    DIAGNOSIS: Multiple Sclerosis  Greater than 50% of this visit was spent educating the patient about the possible diagnosis, prognosis, management plan, and in coordination of care.   Leavy Cella, PsyD Clinical Neuropsychologist

## 2014-10-14 NOTE — Progress Notes (Signed)
Physical Therapy Session Note  Patient Details  Name: Martin Powell MRN: [id number] Date of Birth: July 17, 1945  Today's Date: 10/14/2014 PT Individual Time: 0830-0930 PT Individual Time Calculation (min): 60 min   Short Term Goals: Week 2:  PT Short Term Goal 1 (Week 2): Will perform bed mobility at modI level without rails PT Short Term Goal 2 (Week 2): Will perform transfer w/c <> bed with consistant CGA and RW PT Short Term Goal 3 (Week 2): Will perform standing dynamic balance with single UE support x3-5 min to inc indpendence with adls PT Short Term Goal 4 (Week 2): Will perform gait x150 with supervision and RW  Skilled Therapeutic Interventions/Progress Updates:    Patient in wheelchair, sit to stand at sink to wash hands min assist.  Asked to urinate, assisted into bathroom and pt standing to use urinal with assist to hold urinal due to initial incontinence upon standing.  Assisted to change shorts total assist.  Patient propelled wheelchair in hallway about 80' min assist and cues for straight path due to running into wall on left and increased time required despite cues for increased efficiency of pushing.  In therapy gym pt negotiated 7 steps (3") with bilateral rails min assist, cues for posture, safety with turning and sequence.  Patient seated in chair performed scapular retraction/rows with green theraband 3 x 10 reps, then horizontal abduction x 10 reps w/green band and then shoulder ER bilat x 10, then unilat with towel at side x 10 each mod verbal and tactile cues for performance.  Patient ambulated w/ RW x 70' min assist, cues for step length, proximity to walker, posture, and safety.  Patient seated on wedge performed ant pelvic tilt and scapular retraction x 3 reps.  Then sit to supine supervision for upper trunk stretch with towel roll at spine, then LTR's with LE's on green physioball.  Manual hamstring stretches 2-3 x 20 sec hold each leg, right KTOS x 20 sec hold and piriformis  stretch x 20 sec.  Bilateral bridging x 10 reps.  Supine to sit min assist through sidelying cues for technique.  Assisted back to room in wheelchair and left with all needs in reach.  Therapy Documentation Precautions:  Precautions Precautions: Fall Precaution Comments: hx of MS, R LE weakness w/ R lateral lean, kyphotic posture and posterior lean Restrictions Weight Bearing Restrictions: No RLE Weight Bearing: Weight bearing as tolerated Pain: Pain Assessment Pain Assessment: No/denies pain Locomotion : Ambulation Ambulation/Gait Assistance: 4: Min guard Wheelchair Mobility Distance: 80   See FIM for current functional status  Therapy/Group: Individual Therapy  Rudi Coco [place], [place] [phone] 10/14/2014  10/14/2014, 3:09 PM

## 2014-10-14 NOTE — Plan of Care (Signed)
Problem: RH SKIN INTEGRITY Goal: RH STG SKIN FREE OF INFECTION/BREAKDOWN Skin free of infection/breakdown with minimal assistance  Outcome: Progressing Unna boot changed every tuesdays and saturdays

## 2014-10-14 NOTE — Progress Notes (Signed)
[facility] PHYSICAL MEDICINE & REHABILITATION     PROGRESS NOTE    Subjective/Complaints: No further diarrhea Discussed D/C date    ROS:  No CP/SOB, no nausea , vomiting Diarrhea, no limb or back pain, + weak in RLE>>LLE   Objective: Vital Signs: Blood pressure 148/81, pulse 69, temperature 97.5 F (36.4 C), temperature source Oral, resp. rate 18, weight 110.587 kg (243 lb 12.8 oz), SpO2 99 %. No results found. No results for input(s): WBC, HGB, HCT, PLT in the last 72 hours.  Recent Labs  10/12/14 0640 10/12/14 1711  NA  --  137  K  --  3.8  CL  --  102  GLUCOSE  --  103*  BUN  --  14  CREATININE 1.09 1.11  CALCIUM  --  8.8*   CBG (last 3)  No results for input(s): GLUCAP in the last 72 hours.  Wt Readings from Last 3 Encounters:  10/14/14 110.587 kg (243 lb 12.8 oz)  10/05/14 106.278 kg (234 lb 4.8 oz)  11/12/13 99.338 kg (219 lb)    Physical Exam:  Constitutional: He is oriented to person, place, and time. He appears well-developed and well-nourished.  HENT:  Head: Normocephalic and atraumatic.   Eyes: Conjunctivae are normal. Pupils are equal, round, and reactive to light. Right eye exhibits no discharge. Left eye exhibits no discharge.  Neck: Normal range of motion. Neck supple. No tracheal deviation present. No thyromegaly present.  Cardiovascular: Normal rate. An irregularly irregular rhythm is present.  No murmur heard. Respiratory: No respiratory distress. He has no wheezes. He has no rales on exam GI: Soft. Bowel sounds are normal. He exhibits no distension. There is no tenderness. There is no rebound and no guarding.  Musculoskeletal: He exhibits edema (1+ R and 2+ Left pedal edema).less pain with movement of right leg  Neurological: He is alert and oriented to person, place, and time.  Pt with reasonable insight and awareness. UE's 4/5 delt, tricep, bicep, wrist, HI. RIght LE's 2HF, 3-KE and 3- ankle df/pf, 4- on left throughout LLE . Cannot  eval sensory in feet due to unna boots         Skin: Skin is warm and dry.  Unna boots on BLE appear to be fitting appropriately. Psychiatric: He has a normal mood and affect. His behavior is normal.   Assessment/Plan: 1. Functional deficits secondary to right inferior pubic ramus fracture, deconditioning after C diff enteritis which require 3+ hours per day of interdisciplinary therapy in a comprehensive inpatient rehab setting. Physiatrist is providing close team supervision and 24 hour management of active medical problems listed below. Physiatrist and rehab team continue to assess barriers to discharge/monitor patient progress toward functional and medical goals. Team conference today please see physician documentation under team conference tab, met with team face-to-face to discuss problems,progress, and goals. Formulized individual treatment plan based on medical history, underlying problem and comorbidities. FIM: FIM - Bathing Bathing Steps Patient Completed: Chest, Right Arm, Left Arm, Abdomen, Front perineal area, Buttocks, Right upper leg, Left upper leg Bathing: 5: Supervision: Safety issues/verbal cues  FIM - Upper Body Dressing/Undressing Upper body dressing/undressing steps patient completed: Thread/unthread right sleeve of pullover shirt/dresss, Thread/unthread left sleeve of pullover shirt/dress, Put head through opening of pull over shirt/dress, Pull shirt over trunk Upper body dressing/undressing: 5: Supervision: Safety issues/verbal cues (pt retrieved clothing in standing) FIM - Lower Body Dressing/Undressing Lower body dressing/undressing steps patient completed: Thread/unthread right pants leg, Thread/unthread left pants leg,  Pull pants up/down, Thread/unthread right underwear leg, Thread/unthread left underwear leg, Pull underwear up/down Lower body dressing/undressing: 3: Mod-Patient completed 50-74% of tasks  FIM - Toileting Toileting steps completed by patient: Performs  perineal hygiene Toileting Assistive Devices: Grab bar or rail for support Toileting: 3: Mod-Patient completed 2 of 3 steps  FIM - Radio producer Devices: Environmental consultant, Product manager Transfers: 5-To toilet/BSC: Supervision (verbal cues/safety issues), 5-From toilet/BSC: Supervision (verbal cues/safety issues)  FIM - IT sales professional Transfer: 5: Supine > Sit: Supervision (verbal cues/safety issues), 5: Sit > Supine: Supervision (verbal cues/safety issues), 4: Bed > Chair or W/C: Min A (steadying Pt. > 75%), 4: Chair or W/C > Bed: Min A (steadying Pt. > 75%)  FIM - Locomotion: Wheelchair Distance: 150 Locomotion: Wheelchair: 5: Travels 150 ft or more: maneuvers on rugs and over door sills with supervision, cueing or coaxing FIM - Locomotion: Ambulation Locomotion: Ambulation Assistive Devices: Administrator Ambulation/Gait Assistance: 4: Min guard Locomotion: Ambulation: 2: Travels 50 - 149 ft with minimal assistance (Pt.>75%)  Comprehension Comprehension Mode: Auditory Comprehension: 5-Understands basic 90% of the time/requires cueing < 10% of the time  Expression Expression Mode: Verbal Expression: 5-Expresses basic 90% of the time/requires cueing < 10% of the time.  Social Interaction Social Interaction: 4-Interacts appropriately 75 - 89% of the time - Needs redirection for appropriate language or to initiate interaction.  Problem Solving Problem Solving: 4-Solves basic 75 - 89% of the time/requires cueing 10 - 24% of the time  Memory Memory: 4-Recognizes or recalls 75 - 89% of the time/requires cueing 10 - 24% of the time Medical Problem List and Plan: 1. Functional deficits secondary to pelvic fracture, deconditioning in MS patient related to C diff enteritis 2. DVT Prophylaxis/Anticoagulation: Pharmaceutical: Lovenox prophyllactic dose 3. Pain Management: Tramadol prn effective.  4. Mood: LCSW to follow for evaluation and support.   5. Neuropsych: This patient is capable of making decisions on his own behalf. 6. Stasis ulcers/Skin/Wound Care: Change Unna boots twice a week, had HH RN PTA  -left sided dressing adjusted for comfort--, edema is decreasing 7. Fluids/Electrolytes/Nutrition: Monitor I/O. Continue supplements between meals.   8. C diff colitis: Vancomycin thru 7/14---  -diarrhea resolved - monitor off antibiotic 9. Reactive leucocytosis: 10 MS with chronic right sided weakness: out of home supply of tecfidera, ordered it through pharmacy to avoid relapse 11. HTN: Monitor BP bid.  12. A fib: HR controlled. On amiodarone, BB and low dose ASA.No RVR   LOS (Days) 9 A FACE TO FACE EVALUATION WAS PERFORMED  Alysia Penna E 10/14/2014 8:10 AM

## 2014-10-14 NOTE — Progress Notes (Signed)
Physical Therapy Session Note  Patient Details  Name: Martin Powell MRN: [id number] Date of Birth: December 27, 1945  Today's Date: 10/14/2014 PT Individual Time:  -      Short Term Goals: Week 2:  PT Short Term Goal 1 (Week 2): Will perform bed mobility at modI level without rails PT Short Term Goal 2 (Week 2): Will perform transfer w/c <> bed with consistant CGA and RW PT Short Term Goal 3 (Week 2): Will perform standing dynamic balance with single UE support x3-5 min to inc indpendence with adls PT Short Term Goal 4 (Week 2): Will perform gait x150 with supervision and RW  Skilled Therapeutic Interventions/Progress Updates:    Pt received seated in w/c with no c/o pain and agreeable to treatment. Performed w/c propulsion with BUEs into hallway x175ft with minA due to slow speed, verbal cues for correct hand positioning for inc power production. Gait training x2 trials of 75-100' each with CGA and RW; w/c follow to allow for rest break when fatigued. Verbal cues given for upright posture, to look at picture hanging on wall at end of hallway; unable to maintain without repetitive cues. Pt reports "I'm just used to looking at my feet". Nustep x12 min on level 5 with average 35 steps/min; cued to inc steps/min but pt unable to maintain due to fatigue. Standing balance activity performed with sit <> stand from mat table and upon standing, using BUEs to place cards on board above eye level. Required cues for reaching full upright posture when having difficulty locating cards on board. Occasional uncontrolled descent to sit on mat table when reaching for cards on table. Performed additional gait trial x75' with CGA. Returned to room with totalA in w/c due to time constraints. Performed stand pivot transfer to recliner with RW and supervision. Remained seated in recliner with all needs within reach at completion of session.   Therapy Documentation Precautions:  Precautions Precautions: Fall Precaution  Comments: hx of MS, R LE weakness w/ R lateral lean, kyphotic posture and posterior lean Restrictions Weight Bearing Restrictions: No RLE Weight Bearing: Weight bearing as tolerated Pain: Pain Assessment Pain Assessment: No/denies pain Pain Score: 0-No pain  See FIM for current functional status  Therapy/Group: Individual Therapy  Vista Lawman 10/14/2014, 10:28 AM

## 2014-10-15 ENCOUNTER — Inpatient Hospital Stay (HOSPITAL_COMMUNITY): Payer: Non-veteran care

## 2014-10-15 ENCOUNTER — Inpatient Hospital Stay (HOSPITAL_COMMUNITY): Payer: Medicare Other | Admitting: Physical Therapy

## 2014-10-15 ENCOUNTER — Inpatient Hospital Stay (HOSPITAL_COMMUNITY): Payer: Medicare Other | Admitting: *Deleted

## 2014-10-15 DIAGNOSIS — G8250 Quadriplegia, unspecified: Secondary | ICD-10-CM

## 2014-10-15 NOTE — Progress Notes (Addendum)
Physical Therapy Session Note  Patient Details  Name: Abednego Klein MRN: [id number] Date of Birth: 09/17/1945  Today's Date: 10/15/2014 PT Individual Time: 0800-0900 and 13:05-14:00 (concurrent)  PT Individual Time Calculation (min): 60 min and   Short Term Goals: Week 2:  PT Short Term Goal 1 (Week 2): Will perform bed mobility at modI level without rails PT Short Term Goal 2 (Week 2): Will perform transfer w/c <> bed with consistant CGA and RW PT Short Term Goal 3 (Week 2): Will perform standing dynamic balance with single UE support x3-5 min to inc indpendence with adls PT Short Term Goal 4 (Week 2): Will perform gait x150 with supervision and RW  Skilled Therapeutic Interventions/Progress Updates:      First tx focused on functional mobility training, static and dynamic balance, gait with RW, and NMR via forced use, manual facilitation, and multi-modal cues. Pt up EOB finishing breakfast. Discussed home set-up, assist at home, and problem solving safety and self=-care needs, reviewed goals.   Transfer training with many cues for hand placement, min A overall for steadying. Max cues for serial sit<>stand including anterior translation over BOS.   Dynamic sitting balance and postural control performed with S cues for safety EOB during functional tasks. Static standing balance with S usingbil UE support, min A for 1 UE or dynamically during functional tasks at sink, using urinal and washing hands, changing wet brief.   Pt propelled WC in hallway about 45' S and cues for efficient stroke and increased time required despite cues.   Pt instructed in seated HS stretch with 4" stool 2x20min bil and standing heel cord stretch on wedge 2x13min. Pt needed max cues for accurately perform tasks.   Gait w/ RW x 120' min assist, cues for step length, proximity to walker, posture, and safety.   Second tx:  Tx focused on functional mobility, therex for strength/stretching, gait with RW, and  postural control.  Pt performed zoom ball for scapular retraction x6min with cues for unsuppported trunk control and posture.   Gait in gym, including turns and backing up 2x50' and 1x120' with close S and cues as before, pt unable to modify pattern independently.   Transfers WC<>mat and bed mobility with S and safety cues.   Therapeutic exercise with cues for technique throughout - LTR's with LE's on green physioball 2x10. - Manual hamstring stretches 2-3 x 20 sec hold each leg,  - right knee to opposite shoulder 2x60 sec each  - Bilateral bridging 2 x 10 reps. - [place] HEP for fall risk reduction with handout   Handoff to OT   Therapy Documentation Precautions:  Precautions Precautions: Fall Precaution Comments: hx of MS, R LE weakness w/ R lateral lean, kyphotic posture and posterior lean Restrictions Weight Bearing Restrictions: No RLE Weight Bearing: Weight bearing as tolerated    Vital Signs: Therapy Vitals Temp: 97.6 F (36.4 C) Temp Source: Oral Pulse Rate: 69 Resp: 18 BP: 140/80 mmHg Patient Position (if appropriate): Lying Oxygen Therapy SpO2: 100 % O2 Device: Not Delivered Pain: none        See FIM for current functional status  Therapy/Group: Individual Therapy and concurrent tx  Ciria Bernardini Virl Cagey, PT, DPT  10/15/2014, 8:20 AM

## 2014-10-15 NOTE — Progress Notes (Signed)
Occupational Therapy Session Note  Patient Details  Name: Martin Powell MRN: [id number] Date of Birth: [birth year]  Today's Date: 10/15/2014 OT Individual Time: 1100-1200 OT Individual Time Calculation (min): 60 min    Short Term Goals: Week 2:  OT Short Term Goal 1 (Week 2): Pt will don shorts, socks, and shoes with min A. OT Short Term Goal 2 (Week 2): Pt will complete toilet transfers with RW with close S. OT Short Term Goal 3 (Week 2): Pt will toilet with S.  Skilled Therapeutic Interventions/Progress Updates:    Pt engaged in BADL retraining with focus on bathing/dressing, toilet transfers, toileting, sit<>stand, functional amb with RW, and safety awareness.  Pt requires more than a reasonable amount of time to initiate and complete tasks.  Pt amb with RW in/out bathroom to use toilet.  Pt requires assistance threading pants secondary to BLE wrapped in [place].  Pt requires max verbal cues for upright posture when standing and ambulation.    Therapy Documentation Precautions:  Precautions Precautions: Fall Precaution Comments: hx of MS, R LE weakness w/ R lateral lean, kyphotic posture and posterior lean Restrictions Weight Bearing Restrictions: No RLE Weight Bearing: Weight bearing as tolerated   Pain: Pain Assessment Pain Assessment: No/denies pain  See FIM for current functional status  Therapy/Group: Individual Therapy  Rich Brave 10/15/2014, 12:02 PM

## 2014-10-15 NOTE — Progress Notes (Addendum)
[facility] PHYSICAL MEDICINE & REHABILITATION     PROGRESS NOTE    Subjective/Complaints: No further diarrhea Sister does not have Tecfidera from home    ROS:  No CP/SOB, no Diarrhea, no limb or back pain, urinating fine   Objective: Vital Signs: Blood pressure 140/80, pulse 69, temperature 97.6 F (36.4 C), temperature source Oral, resp. rate 18, weight 110.269 kg (243 lb 1.6 oz), SpO2 100 %. No results found. No results for input(s): WBC, HGB, HCT, PLT in the last 72 hours.  Recent Labs  10/12/14 1711  NA 137  K 3.8  CL 102  GLUCOSE 103*  BUN 14  CREATININE 1.11  CALCIUM 8.8*   CBG (last 3)  No results for input(s): GLUCAP in the last 72 hours.  Wt Readings from Last 3 Encounters:  10/15/14 110.269 kg (243 lb 1.6 oz)  10/05/14 106.278 kg (234 lb 4.8 oz)  11/12/13 99.338 kg (219 lb)    Physical Exam:  Constitutional: He is oriented to person, place, and time. He appears well-developed and well-nourished.  HENT:  Head: Normocephalic and atraumatic.   Eyes: Conjunctivae are normal. . Right eye exhibits no discharge. Left eye exhibits no discharge.  Neck: Normal range of motion. Neck supple. No tracheal deviation present. No thyromegaly present.  Cardiovascular: Normal rate. An irregularly irregular rhythm is present.  No murmur heard. Respiratory: No respiratory distress. He has no wheezes. He has no rales on exam GI: Soft. Bowel sounds are normal. He exhibits no distension. There is no tenderness. There is no rebound and no guarding.  Musculoskeletal: He exhibits edema (1+ R and 2+ Left pedal edema).less pain with movement of right leg  Neurological: He is alert and oriented to person, place, and time.  Pt with reasonable insight and awareness. UE's 4/5 delt, tricep, bicep, wrist, HI. RIght LE's 3-HF, 3-KE and 3- ankle df/pf, 4- on left throughout LLE . Cannot eval sensory in feet due to unna boots         Skin: Skin is warm and dry.  Unna boots on BLE  appear to be fitting appropriately. Psychiatric: He has a normal mood and affect. His behavior is normal.   Assessment/Plan: 1. Functional deficits secondary to right inferior pubic ramus fracture, deconditioning after C diff enteritis which require 3+ hours per day of interdisciplinary therapy in a comprehensive inpatient rehab setting. Physiatrist is providing close team supervision and 24 hour management of active medical problems listed below. Physiatrist and rehab team continue to assess barriers to discharge/monitor patient progress toward functional and medical goals.  FIM: FIM - Bathing Bathing Steps Patient Completed: Chest, Right Arm, Left Arm, Abdomen, Front perineal area, Buttocks, Right upper leg, Left upper leg Bathing: 5: Supervision: Safety issues/verbal cues  FIM - Upper Body Dressing/Undressing Upper body dressing/undressing steps patient completed: Thread/unthread right sleeve of pullover shirt/dresss, Thread/unthread left sleeve of pullover shirt/dress, Put head through opening of pull over shirt/dress, Pull shirt over trunk Upper body dressing/undressing: 5: Supervision: Safety issues/verbal cues FIM - Lower Body Dressing/Undressing Lower body dressing/undressing steps patient completed: Pull pants up/down Lower body dressing/undressing: 1: Total-Patient completed less than 25% of tasks  FIM - Toileting Toileting steps completed by patient: Performs perineal hygiene, Adjust clothing prior to toileting, Adjust clothing after toileting Toileting Assistive Devices: Grab bar or rail for support Toileting: 1: Total-Patient completed zero steps, helper did all 3  FIM - Diplomatic Services operational officer Devices: Environmental consultant, Therapist, music Transfers: 5-To toilet/BSC: Supervision (verbal cues/safety  issues), 5-From toilet/BSC: Supervision (verbal cues/safety issues)  FIM - Bed/Chair Transfer Bed/Chair Transfer: 5: Supine > Sit: Supervision (verbal cues/safety  issues), 4: Sit > Supine: Min A (steadying pt. > 75%/lift 1 leg), 4: Bed > Chair or W/C: Min A (steadying Pt. > 75%)  FIM - Locomotion: Wheelchair Distance: 80 Locomotion: Wheelchair: 2: Travels 50 - 149 ft with minimal assistance (Pt.>75%) FIM - Locomotion: Ambulation Locomotion: Ambulation Assistive Devices: Designer, industrial/product Ambulation/Gait Assistance: 4: Min guard Locomotion: Ambulation: 2: Travels 50 - 149 ft with minimal assistance (Pt.>75%)  Comprehension Comprehension Mode: Auditory Comprehension: 5-Understands basic 90% of the time/requires cueing < 10% of the time  Expression Expression Mode: Verbal Expression: 5-Expresses basic 90% of the time/requires cueing < 10% of the time.  Social Interaction Social Interaction: 4-Interacts appropriately 75 - 89% of the time - Needs redirection for appropriate language or to initiate interaction.  Problem Solving Problem Solving: 4-Solves basic 75 - 89% of the time/requires cueing 10 - 24% of the time  Memory Memory: 4-Recognizes or recalls 75 - 89% of the time/requires cueing 10 - 24% of the time Medical Problem List and Plan: 1. Functional deficits secondary to pelvic fracture, deconditioning in MS patient related to C diff enteritis 2. DVT Prophylaxis/Anticoagulation: Pharmaceutical: Lovenox prophyllactic dose 3. Pain Management: Tramadol prn effective.  4. Mood: LCSW to follow for evaluation and support.  5. Neuropsych: This patient is capable of making decisions on his own behalf. 6. Stasis ulcers/Skin/Wound Care: Change Unna boots twice a week, had HH RN PTA  -left sided dressing adjusted for comfort--, edema is decreasing 7. Fluids/Electrolytes/Nutrition: Monitor I/O. Continue supplements between meals.   8. C diff colitis: Vancomycin completed 7/14--  -diarrhea resolved - monitor off antibiotic 9. Reactive leucocytosis: 10 MS with chronic right sided weakness: out of home supply of tecfidera, ordered it through  pharmacy to avoid relapse- do not see on med list, thus far strength is steady and infact R HF strength improved with therapy 11. HTN: Monitor BP bid.  12. A fib: HR controlled.HR normal at 69 this am  On amiodarone, BB and low dose ASA.No RVR   LOS (Days) 10 A FACE TO FACE EVALUATION WAS PERFORMED  Erick Colace 10/15/2014 7:35 AM

## 2014-10-15 NOTE — Patient Care Conference (Signed)
Inpatient RehabilitationTeam Conference and Plan of Care Update Date: 10/13/2014   Time: 10:35 AM    Patient Name: Martin Powell      Medical Record Number: [id number]  Date of Birth: [birth year] Sex: Male         Room/Bed: 4W17C/4W17C-01 Payor Info: Payor: MEDICARE / Plan: MEDICARE PART A / Product Type: *No Product type* /    Admitting Diagnosis: Pelvic fx deconditioning in MS   Admit Date/Time:  10/05/2014  5:25 PM Admission Comments: No comment available   Primary Diagnosis:  Physical deconditioning Principal Problem: Physical deconditioning  Patient Active Problem List   Diagnosis Date Noted  . Fracture of right inferior pubic ramus 10/06/2014  . Physical deconditioning 10/05/2014  . Pressure ulcer 10/03/2014  . Enteritis due to Clostridium difficile 10/03/2014  . Pelvic fracture 10/03/2014  . Multiple sclerosis 10/01/2014  . Hypercholesteremia 10/01/2014  . Hypotension 09/30/2014  . Dehydration 09/30/2014  . Hypothyroidism 09/30/2014  . Hyperlipidemia 09/30/2014    Expected Discharge Date: Expected Discharge Date: 10/19/14  Team Members Present: Social Worker Present: Staci Acosta, LCSW Nurse Present: Carmie End, RN PT Present: Wanda Plump, PT;Other (comment) Dorcas Carrow, PT) OT Present: [name], OT;Sarah Hoxie, Domenic Schwab, OT SLP Present: Fae Pippin, SLP PPS Coordinator present : Tora Duck, RN, CRRN     Current Status/Progress Goal Weekly Team Focus  Medical   chronic cellulitis and Venous stasis, diarrhea  home with supervision  D/C planning   Bowel/Bladder   Pt continent of bowel and bladder. LBM 7-12. pt can spill urinal at times  manage bowel and bladder min assist  contiune bladder scans and POC   Swallow/Nutrition/ Hydration             ADL's   mod A LB dressing, needs to sit in chair for grooming due to low activity tolerance, close S with toileting and toilet transfers  supervision with toileting and  toilet transfers (upgraded  from min A), min A LB dressing and homemaking skills, grooming in standing  standing balance and tolerance, ADL retraining, pt education   Mobility   minA overall due to balance impairments, poor safety awareness  modI bed mobility, supervision transfers and standing balance, ambulation minA  standing balance, gait safety/endurance   Communication             Safety/Cognition/ Behavioral Observations            Pain   no complaints of pain         Skin   unna boots BLE changed tuesday and saturday. stage II to left heel.   free of further breakdown and free from infection. manage unna boot care max assist  assess skin q shift    Rehab Goals Patient on target to meet rehab goals: Yes Rehab Goals Revised: none *See Care Plan and progress notes for long and short-term goals.  Barriers to Discharge: chronic LE swelling, weakness from MS    Possible Resolutions to Barriers:  Cont rehab, long term may need AL    Discharge Planning/Teaching Needs:  Pt plans to return to his home with his sister and brother-in-law coming to check on him regularly.  Pt's brother from [facility] may also come to stay with him for a bit.  Eventually, pt would llike to go to a [place] ALF and has a HH SW who has been directing him on how to accomplish this.  Family is available as needed for family education.  They visit regularly.   Team  Discussion:  Hospital is having to order pt's MS meds as he is out of them at home.  Pt has chronic deconditioning and has barely been managing at home.  Pt has HH at home and this will continue.  He does not have the motivation/initiation to get up and do things on his own.  He has some safety awareness.  Pt has some incontinence at night.  Pt to hopefully move to ALF soon or have more services through the [place].  Revisions to Treatment Plan:  none   Continued Need for Acute Rehabilitation Level of Care: The patient requires daily medical management by a physician with specialized training  in physical medicine and rehabilitation for the following conditions: Daily direction of a multidisciplinary physical rehabilitation program to ensure safe treatment while eliciting the highest outcome that is of practical value to the patient.: Yes Daily medical management of patient stability for increased activity during participation in an intensive rehabilitation regime.: Yes Daily analysis of laboratory values and/or radiology reports with any subsequent need for medication adjustment of medical intervention for : Neurological problems;Other  Petar Mucci, Vista Deck 10/15/2014, 8:45 PM

## 2014-10-15 NOTE — Progress Notes (Signed)
Occupational Therapy Note  Patient Details  Name: Martin Powell MRN: [id number] Date of Birth: 1946-01-15  Today's Date: 10/15/2014 OT Individual Time: 1400-1430 OT Individual Time Calculation (min): 30 min   Pt denied pain Individual therapy  Pt practiced tub bench transfers X 2 with steady A.  Pt requires more than a reasonable amount of time to complete tasks.  Pt amb with RW for home mgmt tasks with close supervision.  Pt requires max verbal cues for RW safety- standing erect and closer to RW.  Pt noted with forward shoulders and posture when standing and ambulating.  Focus on activity tolerance, sit<>stand, standing balance, functional transfers, and safety awareness to increased independence with BADLs and IADLs.    Martin Powell [facility] 10/15/2014, 2:36 PM

## 2014-10-15 NOTE — Progress Notes (Signed)
Social Work Patient ID: Martin Powell, male   DOB: 05-May-1945, 69 y.o.   MRN: [id number]   CSW met with pt and his sister and brother-in-law to update them on team conference discussion.  Pt wants to go home, but also eventually wants to go to an ALF.  Sister has been trying to work with the [place] with this, as pt does not have the funds to pay for ALF.  She is frustrated with the [place].  They help pt as much as they can.  Pt's brother has offered to come from [facility] to help with pt when he is d/c'd and sister plans to ask him to do so.  CSW gave them information on how to apply for medicaid, ALF lists, rental prices on [employer].  Also, CSW called pt's [place] SW to check with her on what she and pt have discussed.  CSW left a message at the [place] to get pt in with primary MD, but have not received a return call.  Sister is concerned how pt will get his meds because the [place] will not recognize our MD's prescriptions and he needs refills.  CSW will continue to work on this.  Pt's family to bring in his RW to see if we can adjust the height or if he will need a new one.  CSW will remain available to assist as needed.

## 2014-10-15 NOTE — Progress Notes (Signed)
Social Work Patient ID: Martin Powell, male   DOB: 07-May-1945, 69 y.o.   MRN: [id number]   Martin Sloop, LCSW Social Worker Signed  Patient Care Conference 10/13/2014  8:45 PM    Expand All Collapse All   Inpatient RehabilitationTeam Conference and Plan of Care Update Date: 10/13/2014   Time: 10:35 AM     Patient Name: Martin Powell       Medical Record Number: [id number]  Date of Birth: [birth year] Sex: Male         Room/Bed: 4W17C/4W17C-01 Payor Info: Payor: MEDICARE / Plan: MEDICARE PART A / Product Type: *No Product type* /    Admitting Diagnosis: Pelvic fx deconditioning in MS    Admit Date/Time:  10/05/2014  5:25 PM Admission Comments: No comment available   Primary Diagnosis:  Physical deconditioning Principal Problem: Physical deconditioning    Patient Active Problem List     Diagnosis  Date Noted   .  Fracture of right inferior pubic ramus  10/06/2014   .  Physical deconditioning  10/05/2014   .  Pressure ulcer  10/03/2014   .  Enteritis due to Clostridium difficile  10/03/2014   .  Pelvic fracture  10/03/2014   .  Multiple sclerosis  10/01/2014   .  Hypercholesteremia  10/01/2014   .  Hypotension  09/30/2014   .  Dehydration  09/30/2014   .  Hypothyroidism  09/30/2014   .  Hyperlipidemia  09/30/2014     Expected Discharge Date: Expected Discharge Date: 10/19/14  Team Members Present: Social Worker Present: Staci Acosta, LCSW Nurse Present: Carmie End, RN PT Present: Wanda Plump, PT;Other (comment) Dorcas Carrow, PT) OT Present: [name], OT;Sarah Hoxie, Domenic Schwab, OT SLP Present: Fae Pippin, SLP PPS Coordinator present : Tora Duck, RN, CRRN        Current Status/Progress  Goal  Weekly Team Focus   Medical     chronic cellulitis and Venous stasis, diarrhea   home with supervision  D/C planning   Bowel/Bladder     Pt continent of bowel and bladder. LBM 7-12. pt can spill urinal at times  manage bowel and bladder min assist   contiune  bladder scans and POC   Swallow/Nutrition/ Hydration               ADL's     mod A LB dressing, needs to sit in chair for grooming due to low activity tolerance, close S with toileting and toilet transfers   supervision with toileting and  toilet transfers (upgraded from min A), min A LB dressing and homemaking skills, grooming in standing   standing balance and tolerance, ADL retraining, pt education    Mobility     minA overall due to balance impairments, poor safety awareness   modI bed mobility, supervision transfers and standing balance, ambulation minA  standing balance, gait safety/endurance    Communication               Safety/Cognition/ Behavioral Observations              Pain     no complaints of pain         Skin     unna boots BLE changed tuesday and saturday. stage II to left heel.   free of further breakdown and free from infection. manage unna boot care max assist  assess skin q shift    Rehab Goals Patient on target to meet rehab goals: Yes Rehab Goals Revised: none *See Care  Plan and progress notes for long and short-term goals.    Barriers to Discharge:  chronic LE swelling, weakness from MS      Possible Resolutions to Barriers:   Cont rehab, long term may need AL     Discharge Planning/Teaching Needs:   Pt plans to return to his home with his sister and brother-in-law coming to check on him regularly.  Pt's brother from [facility] may also come to stay with him for a bit.  Eventually, pt would llike to go to a [place] ALF and has a HH SW who has been directing him on how to accomplish this.   Family is available as needed for family education.  They visit regularly.    Team Discussion:    Hospital is having to order pt's MS meds as he is out of them at home.  Pt has chronic deconditioning and has barely been managing at home.  Pt has HH at home and this will continue.  He does not have the motivation/initiation to get up and do things on his own.  He has some safety  awareness.  Pt has some incontinence at night.  Pt to hopefully move to ALF soon or have more services through the [place].   Revisions to Treatment Plan:    none    Continued Need for Acute Rehabilitation Level of Care: The patient requires daily medical management by a physician with specialized training in physical medicine and rehabilitation for the following conditions: Daily direction of a multidisciplinary physical rehabilitation program to ensure safe treatment while eliciting the highest outcome that is of practical value to the patient.: Yes Daily medical management of patient stability for increased activity during participation in an intensive rehabilitation regime.: Yes Daily analysis of laboratory values and/or radiology reports with any subsequent need for medication adjustment of medical intervention for : Neurological problems;Other  Kc Sedlak, Vista Deck 10/15/2014, 8:45 PM

## 2014-10-16 ENCOUNTER — Inpatient Hospital Stay (HOSPITAL_COMMUNITY): Payer: Medicare Other | Admitting: Occupational Therapy

## 2014-10-16 DIAGNOSIS — R5381 Other malaise: Secondary | ICD-10-CM

## 2014-10-16 NOTE — Progress Notes (Signed)
10/16/14/1818 nsg High touched surfaces  bleached per protocol with NT.

## 2014-10-16 NOTE — Progress Notes (Signed)
Orthopedic Tech Progress Note Patient Details:  Martin Powell Mar 01, 1946 [id number]  Ortho Devices Type of Ortho Device: Ace wrap, Unna boot Ortho Device/Splint Location: Bilateral unna boots Ortho Device/Splint Interventions: Application   Cammer, Mickie Bail 10/16/2014, 10:52 AM

## 2014-10-16 NOTE — Progress Notes (Signed)
Martin Powell is a 69 y.o. male 08-23-1945 [id number]  Subjective: Pleasant. No new complaints or problems. Slept well. Feeling OK. ?re: unna boot change today  Objective: Vital signs in last 24 hours: Temp:  [98.3 F (36.8 C)-98.4 F (36.9 C)] 98.4 F (36.9 C) (07/16 0513) Pulse Rate:  [78-98] 80 (07/16 0513) Resp:  [16] 16 (07/16 0513) BP: (109-152)/(64-95) 148/95 mmHg (07/16 0513) SpO2:  [97 %-98 %] 98 % (07/16 0513) Weight change:  Last BM Date: 10/15/14  Intake/Output from previous day: 07/15 0701 - 07/16 0700 In: 1200 [P.O.:1200] Out: 1240 [Urine:1240]  Physical Exam General: No apparent distress; In [facility] -   Lungs: Normal effort. Lungs clear to auscultation, no crackles or wheezes. Cardiovascular: Regular rate and rhythm, no edema BLE in unna boot wraps Musculoskeletal:  Neurovascularly intact Neurological: No new neurological deficits Wounds: dressing clean, dry, intact. No signs of infection.  Lab Results: BMET    Component Value Date/Time   NA 137 10/12/2014 1711   K 3.8 10/12/2014 1711   CL 102 10/12/2014 1711   CO2 28 10/12/2014 1711   GLUCOSE 103* 10/12/2014 1711   BUN 14 10/12/2014 1711   CREATININE 1.11 10/12/2014 1711   CALCIUM 8.8* 10/12/2014 1711   GFRNONAA >60 10/12/2014 1711   GFRAA >60 10/12/2014 1711   CBC    Component Value Date/Time   WBC 10.9* 10/06/2014 0535   RBC 4.41 10/06/2014 0535   HGB 12.8* 10/06/2014 0535   HCT 41.0 10/06/2014 0535   PLT 208 10/06/2014 0535   MCV 93.0 10/06/2014 0535   MCH 29.0 10/06/2014 0535   MCHC 31.2 10/06/2014 0535   RDW 13.5 10/06/2014 0535   LYMPHSABS 0.5* 10/06/2014 0535   MONOABS 1.4* 10/06/2014 0535   EOSABS 0.7 10/06/2014 0535   BASOSABS 0.1 10/06/2014 0535   CBG's (last 3):  No results for input(s): GLUCAP in the last 72 hours. LFT's Lab Results  Component Value Date   ALT 14* 10/06/2014   AST 23 10/06/2014   ALKPHOS 54 10/06/2014   BILITOT 1.3* 10/06/2014    Studies/Results: No  results found.  Medications:  I have reviewed the patient's current medications. Scheduled Medications: . amiodarone  200 mg Oral Daily  . aspirin  81 mg Oral Daily  . atorvastatin  80 mg Oral Daily  . cholecalciferol  2,000 Units Oral Daily  . Dimethyl Fumarate  240 mg Oral BID  . enoxaparin (LOVENOX) injection  40 mg Subcutaneous Q24H  . feeding supplement  1 Container Oral Q1500  . feeding supplement (ENSURE ENLIVE)  237 mL Oral Daily  . gabapentin  300 mg Oral TID  . ipratropium  2 spray Each Nare QID  . levothyroxine  125 mcg Oral QAC breakfast  . metoprolol  50 mg Oral BID  . multivitamin with minerals  1 tablet Oral Daily   PRN Medications: acetaminophen, alum & mag hydroxide-simeth, ondansetron **OR** ondansetron (ZOFRAN) IV, traMADol, traZODone  Assessment/Plan: Principal Problem:   Physical deconditioning Active Problems:   Hypothyroidism   Multiple sclerosis   Enteritis due to Clostridium difficile   Fracture of right inferior pubic ramus   1. Functional deficits secondary to pelvic fracture, deconditioning in MS patient related to C diff enteritis 2. DVT Prophylaxis/Anticoagulation: Pharmaceutical: Lovenox prophyllactic dose 3. Pain Management: Tramadol prn effective.  4. Mood: LCSW to follow for evaluation and support.  5. Neuropsych: This patient is capable of making decisions on his own behalf. 6. Stasis ulcers/Skin/Wound Care: Change Unna boots twice a week,  had HH RN PTA 7. Fluids/Electrolytes/Nutrition: Monitor I/O. Continue supplements between meals.  8. C diff colitis: Vancomycin completed 7/14-- -diarrhea resolved - monitor off antibiotic 9. MS with chronic right sided weakness: out of home supply of tecfidera, ordered it through pharmacy to avoid relapse- do not see on med list, thus far strength is steady and infact R HF strength improved with therapy 11. HTN: Monitor BP bid.  12. A fib: HR controlled.HR normal at 69 this am On  amiodarone, BB and low dose ASA.No RVR  Length of stay, days: 11   Valerie A. Felicity Coyer, MD 10/16/2014, 9:56 AM

## 2014-10-16 NOTE — Progress Notes (Signed)
Occupational Therapy Session Note  Patient Details  Name: Martin Powell MRN: [id number] Date of Birth: 01-14-1946  Today's Date: 10/16/2014 OT Individual Time: 1445-1515 OT Individual Time Calculation (min): 30 min    Short Term Goals: Week 2:  OT Short Term Goal 1 (Week 2): Pt will don shorts, socks, and shoes with min A. OT Short Term Goal 2 (Week 2): Pt will complete toilet transfers with RW with close S. OT Short Term Goal 3 (Week 2): Pt will toilet with S.  Skilled Therapeutic Interventions/Progress Updates:  Upon entering the room, pt seated in recliner chair with no c/o pain. Pt ambulating with RW and min guard to bathroom for toilet transfer with supervision onto elevated toilet seat with supervision. Pt did not need to void this session. Pt ambulating with RW out to sink to wash hands while standing with close supervision. Pt then ambulating 90' to day room with RW and min guard. Pt required min verbal cues to stay within RW as well as to look ahead while ambulating. Pt taking seated rest break secondary to fatigue. Pt ambulating back to room with RW and in same manner with wheelchair follow for safety. Pt returned to recliner chair with call bell and all needed items within reach upon exiting the room.   Therapy Documentation Precautions:  Precautions Precautions: Fall Precaution Comments: hx of MS, R LE weakness w/ R lateral lean, kyphotic posture and posterior lean Restrictions Weight Bearing Restrictions: No RLE Weight Bearing: Weight bearing as tolerated Vital Signs: Therapy Vitals Temp: 98.1 F (36.7 C) Temp Source: Oral Pulse Rate: 92 Resp: 18 BP: (!) 132/93 mmHg Patient Position (if appropriate): Sitting Oxygen Therapy SpO2: 98 % O2 Device: Not Delivered  See FIM for current functional status  Therapy/Group: Individual Therapy  Lowella Grip 10/16/2014, 4:33 PM

## 2014-10-17 ENCOUNTER — Inpatient Hospital Stay (HOSPITAL_COMMUNITY): Payer: Medicare Other | Admitting: Occupational Therapy

## 2014-10-17 DIAGNOSIS — S32591S Other specified fracture of right pubis, sequela: Secondary | ICD-10-CM

## 2014-10-17 DIAGNOSIS — G35 Multiple sclerosis: Secondary | ICD-10-CM

## 2014-10-17 DIAGNOSIS — A047 Enterocolitis due to Clostridium difficile: Secondary | ICD-10-CM

## 2014-10-17 NOTE — Progress Notes (Signed)
Occupational Therapy Session Note  Patient Details  Name: Martin Powell MRN: [id number] Date of Birth: 1945-09-02  Today's Date: 10/17/2014 OT Individual Time:  -    1100-1200  (60 min)      Short Term Goals: Week 1:  OT Short Term Goal 1 (Week 1): Pt will perform toilet transfer with mod A in order to decrease level of assist needed for functional transfer. OT Short Term Goal 1 - Progress (Week 1): Met OT Short Term Goal 2 (Week 1): Pt will perform clothing management with min A standing balance. OT Short Term Goal 2 - Progress (Week 1): Met OT Short Term Goal 3 (Week 1): Pt will perform LB dressing with Mod A in order to increase I with self care. OT Short Term Goal 3 - Progress (Week 1): Met OT Short Term Goal 4 (Week 1): Pt will perform  UB dressing with set up A in order to increase I in self care. OT Short Term Goal 4 - Progress (Week 1): Met Week 2:  OT Short Term Goal 1 (Week 2): Pt will don shorts, socks, and shoes with min A. OT Short Term Goal 2 (Week 2): Pt will complete toilet transfers with RW with close S. OT Short Term Goal 3 (Week 2): Pt will toilet with S.  Skilled Therapeutic Interventions/Progress Updates:      Skilled OT intervention with treatment focus on the following: Pt engaged in BADL retraining with focus on  sit<>stand, functional amb with RW, and safety awareness.  Pt ambulated around room and gathered supplies for grooming at sink.   Used RW at minimal assist with reaching and cues for reaching safely.  Propelled wc to gym.  Stood using SCI fit for 80mn; rest 1 min; stood 3 min; rest 5 minute.  Sci Fit set at 5 workload.  Stood 5 minutes for last round.  Pt had difficulty with sit to stand, and standing balance while working [employer] the [employer]  Provided cues for propelled wc to room with increased time and left in room with all needs in place.    Therapy Documentation Precautions:  Precautions Precautions: Fall Precaution Comments: hx of MS, R LE weakness w/ R  lateral lean, kyphotic posture and posterior lean Restrictions Weight Bearing Restrictions: No RLE Weight Bearing: Weight bearing as tolerated      Pain:   none           See FIM for current functional status  Therapy/Group: Individual Therapy  ELisa Roca7/17/2016, 8:00 AM

## 2014-10-17 NOTE — Progress Notes (Signed)
Martin Powell is a 69 y.o. male 25-May-1945 [id number]  Subjective: No new complaints. No new problems. Slept well and feels fine.  Objective: Vital signs in last 24 hours: Temp:  [98 F (36.7 C)-98.1 F (36.7 C)] 98 F (36.7 C) (07/17 0604) Pulse Rate:  [76-92] 87 (07/17 0604) Resp:  [18] 18 (07/17 0604) BP: (132-164)/(91-111) 146/91 mmHg (07/17 0604) SpO2:  [96 %-98 %] 96 % (07/17 0604) Weight change:  Last BM Date: 10/15/14  Intake/Output from previous day: 07/16 0701 - 07/17 0700 In: 840 [P.O.:840] Out: 2601 [Urine:2600; Stool:1]  Physical Exam General: No apparent distress    Lungs: Normal effort. Lungs clear to auscultation, no crackles or wheezes. Cardiovascular: Regular rate and rhythm, no edema B unna boots intact   Lab Results: BMET    Component Value Date/Time   NA 137 10/12/2014 1711   K 3.8 10/12/2014 1711   CL 102 10/12/2014 1711   CO2 28 10/12/2014 1711   GLUCOSE 103* 10/12/2014 1711   BUN 14 10/12/2014 1711   CREATININE 1.11 10/12/2014 1711   CALCIUM 8.8* 10/12/2014 1711   GFRNONAA >60 10/12/2014 1711   GFRAA >60 10/12/2014 1711   CBC    Component Value Date/Time   WBC 10.9* 10/06/2014 0535   RBC 4.41 10/06/2014 0535   HGB 12.8* 10/06/2014 0535   HCT 41.0 10/06/2014 0535   PLT 208 10/06/2014 0535   MCV 93.0 10/06/2014 0535   MCH 29.0 10/06/2014 0535   MCHC 31.2 10/06/2014 0535   RDW 13.5 10/06/2014 0535   LYMPHSABS 0.5* 10/06/2014 0535   MONOABS 1.4* 10/06/2014 0535   EOSABS 0.7 10/06/2014 0535   BASOSABS 0.1 10/06/2014 0535   CBG's (last 3):  No results for input(s): GLUCAP in the last 72 hours. LFT's Lab Results  Component Value Date   ALT 14* 10/06/2014   AST 23 10/06/2014   ALKPHOS 54 10/06/2014   BILITOT 1.3* 10/06/2014    Studies/Results: No results found.  Medications:  I have reviewed the patient's current medications. Scheduled Medications: . amiodarone  200 mg Oral Daily  . aspirin  81 mg Oral Daily  . atorvastatin   80 mg Oral Daily  . cholecalciferol  2,000 Units Oral Daily  . Dimethyl Fumarate  240 mg Oral BID  . enoxaparin (LOVENOX) injection  40 mg Subcutaneous Q24H  . feeding supplement  1 Container Oral Q1500  . feeding supplement (ENSURE ENLIVE)  237 mL Oral Daily  . gabapentin  300 mg Oral TID  . ipratropium  2 spray Each Nare QID  . levothyroxine  125 mcg Oral QAC breakfast  . metoprolol  50 mg Oral BID  . multivitamin with minerals  1 tablet Oral Daily   PRN Medications: acetaminophen, alum & mag hydroxide-simeth, ondansetron **OR** ondansetron (ZOFRAN) IV, traMADol, traZODone  Assessment/Plan: Principal Problem:   Physical deconditioning Active Problems:   Hypothyroidism   Multiple sclerosis   Enteritis due to Clostridium difficile   Fracture of right inferior pubic ramus  1. Functional deficits secondary to pelvic fracture, deconditioning in MS patient related to C diff enteritis 2. DVT Prophylaxis/Anticoagulation: Pharmaceutical: Lovenox prophyllactic dose 3. Pain Management: Tramadol prn effective.  4. Mood: LCSW to follow for evaluation and support.  5. Neuropsych: This patient is capable of making decisions on his own behalf. 6. Stasis ulcers/Skin/Wound Care: Change Unna boots twice a week, had HH RN PTA 7. Fluids/Electrolytes/Nutrition: Monitor I/O. Continue supplements between meals.  8. C diff colitis: Vancomycin completed 7/14-- -diarrhea resolved - monitor  off antibiotic 9. MS with chronic right sided weakness: out of home supply of tecfidera, ordered it through pharmacy to avoid relapse- thus far strength is steady and R HF strength is improved with ongoing therapy 11. HTN: Monitor BP bid.  12. A fib: HR controlled.HR normal at 69 this am On amiodarone, BB and low dose ASA. No RVR  Length of stay, days: 12  Furious Chiarelli A. Felicity Coyer, MD 10/17/2014, 9:16 AM

## 2014-10-18 ENCOUNTER — Inpatient Hospital Stay (HOSPITAL_COMMUNITY): Payer: Non-veteran care | Admitting: Occupational Therapy

## 2014-10-18 ENCOUNTER — Inpatient Hospital Stay (HOSPITAL_COMMUNITY): Payer: Medicare Other

## 2014-10-18 ENCOUNTER — Inpatient Hospital Stay (HOSPITAL_COMMUNITY): Payer: Medicare Other | Admitting: Occupational Therapy

## 2014-10-18 NOTE — Progress Notes (Signed)
Occupational Therapy Session Note  Patient Details  Name: Martin Powell MRN: [id number] Date of Birth: 22-Mar-1946  Today's Date: 10/18/2014 OT Individual Time: 1300-1400 OT Individual Time Calculation (min): 60 min    Short Term Goals: Week 2:  OT Short Term Goal 1 (Week 2): Pt will don shorts, socks, and shoes with min A. OT Short Term Goal 2 (Week 2): Pt will complete toilet transfers with RW with close S. OT Short Term Goal 3 (Week 2): Pt will toilet with S.  Skilled Therapeutic Interventions/Progress Updates:    Pt seen for OT therapy session focusing on functional activity tolerance and UE/LE strengthening and endurance. Pt in w/c upon arrival, agreeable to tx. Pt self propelled w/c to therapy gym with increased time and min VCs for w/c management. In therapy gym, pt stood at side of parallel bars and completed LE ab/adduction exercises x10 on each side and forward/back leg swings with seated rest break between sets.  Pt completed UE cycling bike seated in w/c for 5 minutes on level 1, required to sit up from back of w/c for UE and core strengthening and stability. He then stood to complete laundry folding task, x2 trials, tolerating ~2-3 minutes of static standing at RW with no s/s of fatigue. Pt ambulated back to room at end of session with supervision using RW and mod VCs for RW management. Pt left sitting in recliner at end of session, all needs in reach.  Pt educated regarding energy conservation, fall risk, RW management, and d/c planning.   Therapy Documentation Precautions:  Precautions Precautions: Fall Precaution Comments: hx of MS, R LE weakness w/ R lateral lean, kyphotic posture and posterior lean Restrictions Weight Bearing Restrictions: No RLE Weight Bearing: Weight bearing as tolerated Pain: Pain Assessment Pain Assessment: No/denies pain  See FIM for current functional status  Therapy/Group: Individual Therapy  Lewis, Emilina Smarr C 10/18/2014, 7:20 AM

## 2014-10-18 NOTE — Progress Notes (Signed)
Occupational Therapy Discharge Summary  Patient Details  Name: Martin Powell MRN: [id number] Date of Birth: 1945/11/14   Patient has met 10 of 10 long term goals due to improved activity tolerance, improved balance, postural control and ability to compensate for deficits.  Patient to discharge at overall Supervision level.  Patient's care partner is independent to provide the necessary physical assistance at discharge.    Reasons goals not met: n/a  Recommendation:  Patient will benefit from ongoing skilled OT services in home health setting to continue to advance functional skills in the area of BADL and  iADL.  Equipment: BSC  Reasons for discharge: treatment goals met  Patient/family agrees with progress made and goals achieved: Yes  OT Discharge ADL ADL ADL Comments: min A with LB dressing and bathing, supervision toileting and toilet transfers Vision/Perception  Vision- History Patient Visual Report: No change from baseline Vision- Assessment Vision Assessment?: No apparent visual deficits  Cognition Overall Cognitive Status: Within Functional Limits for tasks assessed Sensation Sensation Light Touch Impaired Details: Impaired RLE;Impaired LLE Stereognosis: Appears Intact Hot/Cold: Appears Intact Proprioception: Appears Intact Coordination Coordination and Movement Description: Pt with many strength deficits and premorbid deficits esp in RLE causing coordination deficits.  Motor  Motor Motor - Discharge Observations: improved postural control and balance at a S level Mobility    close S with transfers Trunk/Postural Assessment  Postural Control Postural Limitations: Pt can not stand with UE support with S; continues to have severe kyphotic posture. unable to fully stand up.  Balance Static Sitting Balance Static Sitting - Level of Assistance: 7: Independent Dynamic Sitting Balance Sitting balance - Comments: mod I Static Standing Balance Static Standing - Level  of Assistance: 5: Stand by assistance Dynamic Standing Balance Dynamic Standing - Balance Support: Left upper extremity supported;Right upper extremity supported Dynamic Standing - Level of Assistance: 5: Stand by assistance Extremity/Trunk Assessment RUE Assessment RUE Assessment: Within Functional Limits LUE Assessment LUE Assessment: Within Functional Limits  See FIM for current functional status  [name] 10/18/2014, 2:24 PM

## 2014-10-18 NOTE — Progress Notes (Signed)
[place] [facility] RE[facility]ILITATION     PROGRESS NOTE    Subjective/Complaints: No further diarrhea Aware of d/c in am    ROS:  No CP/SOB, no Diarrhea,  urinating fine   Objective: Vital Signs: Blood pressure 148/90, pulse 71, temperature 97.4 F (36.3 C), temperature source Oral, resp. rate 18, weight 110.269 kg (243 lb 1.6 oz), SpO2 99 %. No results found. No results for input(s): WBC, HGB, HCT, PLT in the last 72 hours. No results for input(s): NA, K, CL, GLUCOSE, BUN, CREATININE, CALCIUM in the last 72 hours.  Invalid input(s): CO CBG (last 3)  No results for input(s): GLUCAP in the last 72 hours.  Wt Readings from Last 3 Encounters:  10/15/14 110.269 kg (243 lb 1.6 oz)  10/05/14 106.278 kg (234 lb 4.8 oz)  11/12/13 99.338 kg (219 lb)    Physical Exam:  Constitutional: He is oriented to person, place, and time. He appears well-developed and well-nourished.  HENT:  Head: Normocephalic and atraumatic.   Eyes: Conjunctivae are normal. . Right eye exhibits no discharge. Left eye exhibits no discharge.  Neck: Normal range of motion. Neck supple. No tracheal deviation present. No thyromegaly present.  Cardiovascular: Normal rate. An irregularly irregular rhythm is present.  No murmur heard. Respiratory: No respiratory distress. He has no wheezes. He has no rales on exam GI: Soft. Bowel sounds are normal. He exhibits no distension. There is no tenderness. There is no rebound and no guarding.  Musculoskeletal: He exhibits edema (1+ R and 2+ Left pedal edema)no  pain with movement of right leg  Neurological: He is alert and oriented to person, place, and time.  Marland Kitchen UE's 4/5 delt, tricep, bicep, wrist, HI. RIght LE's 3-HF, 3-KE and 3- ankle df/pf, 4- on left throughout LLE . Cannot eval sensory in feet due to unna boots         Skin: Skin is warm and dry.  Unna boots on BLE appear to be fitting appropriately. Psychiatric: He has a normal mood and affect. His  behavior is normal.   Assessment/Plan: 1. Functional deficits secondary to right inferior pubic ramus fracture, deconditioning after C diff enteritis which require 3+ hours per day of interdisciplinary therapy in a comprehensive inpatient rehab setting. Physiatrist is providing close team supervision and 24 hour management of active medical problems listed below. Physiatrist and rehab team continue to assess barriers to discharge/monitor patient progress toward functional and medical goals. Anticipate d/c FIM: FIM - Bathing Bathing Steps Patient Completed: Chest, Right Arm, Left Arm, Abdomen, Front perineal area, Buttocks, Right upper leg, Left upper leg Bathing: 5: Supervision: Safety issues/verbal cues  FIM - Upper Body Dressing/Undressing Upper body dressing/undressing steps patient completed: Thread/unthread right sleeve of pullover shirt/dresss, Thread/unthread left sleeve of pullover shirt/dress, Put head through opening of pull over shirt/dress, Pull shirt over trunk Upper body dressing/undressing: 5: Supervision: Safety issues/verbal cues FIM - Lower Body Dressing/Undressing Lower body dressing/undressing steps patient completed: Pull pants up/down Lower body dressing/undressing: 1: Total-Patient completed less than 25% of tasks  FIM - Toileting Toileting steps completed by patient: Adjust clothing prior to toileting, Performs perineal hygiene Toileting Assistive Devices: Grab bar or rail for support Toileting: 3: Mod-Patient completed 2 of 3 steps  FIM - Diplomatic Services operational officer Devices: Environmental consultant, Therapist, music Transfers: 5-To toilet/BSC: Supervision (verbal cues/safety issues), 5-From toilet/BSC: Supervision (verbal cues/safety issues)  FIM - Banker Devices: Walker, Arm rests Bed/Chair Transfer: 5: Supine >  Sit: Supervision (verbal cues/safety issues), 5: Sit > Supine: Supervision (verbal cues/safety issues)  FIM -  Locomotion: Wheelchair Distance: 60 Locomotion: Wheelchair: 2: Travels 50 - 149 ft with supervision, cueing or coaxing FIM - Locomotion: Ambulation Locomotion: Ambulation Assistive Devices: Designer, industrial/product Ambulation/Gait Assistance: 4: Min guard, 4: Min assist Locomotion: Ambulation: 2: Travels 50 - 149 ft with minimal assistance (Pt.>75%)  Comprehension Comprehension Mode: Auditory Comprehension: 5-Understands basic 90% of the time/requires cueing < 10% of the time  Expression Expression Mode: Verbal Expression: 5-Expresses complex 90% of the time/cues < 10% of the time  Social Interaction Social Interaction: 5-Interacts appropriately 90% of the time - Needs monitoring or encouragement for participation or interaction.  Problem Solving Problem Solving: 4-Solves basic 75 - 89% of the time/requires cueing 10 - 24% of the time  Memory Memory: 4-Recognizes or recalls 75 - 89% of the time/requires cueing 10 - 24% of the time Medical Problem List and Plan: 1. Functional deficits secondary to pelvic fracture, deconditioning in MS patient related to C diff enteritis 2. DVT Prophylaxis/Anticoagulation: Pharmaceutical: Lovenox prophyllactic dose to d.c. In am 3. Pain Management: Tramadol prn effective.  4. Mood: LCSW to follow for evaluation and support.  5. Neuropsych: This patient is capable of making decisions on his own behalf. 6. Stasis ulcers/Skin/Wound Care: Change Unna boots twice a week, had HH RN PTA  -left sided dressing adjusted for comfort--, edema is decreasing 7. Fluids/Electrolytes/Nutrition: Monitor I/O. Continue supplements between meals.   8. C diff colitis: Vancomycin completed 7/14--  -diarrhea resolved - monitor off antibiotic  10 MS with chronic right sided weakness: out of home supply of tecfidera, ordered it through pharmacy to avoid relapse- do not see on med list, thus far strength is steady and infact R HF strength improved with therapy 11. HTN: Monitor  BP bid.  12. A fib: HR controlled.HR normal at 69 this am  On amiodarone, BB and low dose ASA.No RVR   LOS (Days) 13 A FACE TO FACE EVALUATION WAS PERFORMED  Aquan Kope E 10/18/2014 8:00 AM

## 2014-10-18 NOTE — Progress Notes (Signed)
Occupational Therapy Session Note  Patient Details  Name: Martin Powell MRN: [id number] Date of Birth: 1945-11-26  Today's Date: 10/18/2014 OT Individual Time: 5320-2334 OT Individual Time Calculation (min): 75 min    Short Term Goals: Week 1:  OT Short Term Goal 1 (Week 1): Pt will perform toilet transfer with mod A in order to decrease level of assist needed for functional transfer. OT Short Term Goal 1 - Progress (Week 1): Met OT Short Term Goal 2 (Week 1): Pt will perform clothing management with min A standing balance. OT Short Term Goal 2 - Progress (Week 1): Met OT Short Term Goal 3 (Week 1): Pt will perform LB dressing with Mod A in order to increase I with self care. OT Short Term Goal 3 - Progress (Week 1): Met OT Short Term Goal 4 (Week 1): Pt will perform  UB dressing with set up A in order to increase I in self care. OT Short Term Goal 4 - Progress (Week 1): Met Week 2:  OT Short Term Goal 1 (Week 2): Pt will don shorts, socks, and shoes with min A. OT Short Term Goal 2 (Week 2): Pt will complete toilet transfers with RW with close S. OT Short Term Goal 3 (Week 2): Pt will toilet with S.  Skilled Therapeutic Interventions/Progress Updates:    Pt completed basic ADLs with supervision and ADL transfers to toilet with Supervision and stepping in and out of tub with min A. Pt has shower seat at home and recommended he ask [facility] for a [facility] bench to increase overall safety. Pt did not want to purchase a tub bench at this time. He stated that his sister does the laundry and he has practice kitchen tasks with OTs 4x and does not want to do that again.  Pt wanted to work on walking and endurance. Pt ambulated with RW over 100 feet before needing a rest. Pt then worked on w/c propulsion with S.   Pt engaged in UE strengthening with high rep exercises and 1.5# wts.  Pt returned to room with all needs in place.     Therapy Documentation Precautions:  Precautions Precautions: Fall Precaution  Comments: hx of MS, R LE weakness w/ R lateral lean, kyphotic posture and posterior lean Restrictions Weight Bearing Restrictions: No RLE Weight Bearing: Weight bearing as tolerated    Pain: Pain Assessment Pain Assessment: No/denies pain ADL:  See FIM for current functional status  Therapy/Group: Individual Therapy  [place] 10/18/2014, 12:57 PM

## 2014-10-18 NOTE — Progress Notes (Signed)
Social Work Patient ID: Martin Powell, male   DOB: 1945-07-23, 69 y.o.   MRN: [id number] Met with pt to discuss equipment, he will get wide rolling walker from [place] when he is there, will uses regular rolling walker until then. Have attempted to make follow up appointment at [facility] clinic without success. Contacted [facility] to have resume services, they were following prior to admission. Pt feels ready for discharge tomorrow, sister to come and transport home tomorrow.

## 2014-10-18 NOTE — Progress Notes (Signed)
Physical Therapy Discharge Summary  Patient Details  Name: Martin Powell MRN: [id number] Date of Birth: [birth year]  Today's Date: 10/18/2014 PT Individual Time:9:00  -  10:00, 60 min individual tx     Patient has met 7 of 8 long term goals due to improved activity tolerance, improved balance, improved postural control, increased strength, ability to compensate for deficits, functional use of  right lower extremity and left lower extremity and improved awareness.  Patient to discharge at an ambulatory level Supervision.   Patient's care partner available to provide the necessary cognitive assistance at discharge, but only intermittently.  Reasons goals not met: n/a  Recommendation:  Patient will benefit from ongoing skilled PT services in home health setting to continue to advance safe functional mobility, address ongoing impairments in balance, postural control, flexibilty, strength, cognition, and minimize fall risk. PT strongly recommends pt have 24/7 supervision for the first few days after d/c to see that he gets back into his routine.  Family is aware of this. Pt has a brother in [place] who was considering staying with him, but no information on this at d/c. Sister will continue to look in on him daily.  Pt should be on waiting list for AL at the same facility where he currently resides; pt needs [facility] funding for this.  Equipment: No equipment provided; pt would benefit from a wide RW, but is unable to pay for one. [facility] has provided his DME in the past; family should pursue this. Pt owns a RW, [id number].  Reasons for discharge: treatment goals met and discharge from hospital  Patient/family agrees with progress made and goals achieved: Yes  PT Discharge tx on 10/18/14- PT recommended that pt try to urinate before leaving room, due to urinary urgency.  Pt initially declined, but when reminded of incontinence during past sessions, agreed. Toilet transfer with supervision. Pt unable to urinate or defecate.  W/c propulsion on unit modified independent after 1 cue to propel to gym. Discussed HEP with pt for falls reduction, eliminating calf raises and toe raises for now due to slow healing wounds bil LL and Unna boots so that PT is unable to visualize them. Hand out modified. Discussed car transfers at length with pt; he volunteered that he has gotten into cars hips first for many years; practice of this not deemed necessary. Family unavailable. Gait with RW x 100' with supervision, cues to keep R foot within legs of RW.  Up/down 12 steps 2 rails, supervision.  Gait x 50' as above, with pt stating he urgently needed to urinate.  Pt quickly transported by PT back to his room. Toilet transfer as above.  Pt urinated and defecated.  He needed urging to use soap when washing hands at sink in sitting in w/c. (Sister previously stated that he did not wash his hands often enough at home.) Pt left sitting in w/c, with all needs within reach.  Precautions/Restrictions Precautions Precaution Comments: hx of MS, kyphotic posture  Restrictions Weight Bearing Restrictions: No RLE Weight Bearing: Weight bearing as tolerated (R pubic fx)   Pain- none reported   Vision/Perception - no changes due to MS, per pt    Cognition Overall Cognitive Status: Within Functional Limits for tasks assessed Arousal/Alertness: Awake/alert Orientation Level: Oriented to place;Oriented to person;Oriented to situation Memory: Impaired Memory Impairment: Decreased recall of new information Judgement: Comments: pt has stated that he fell about once per week in the last few months (sister confirmed this), but never told his MDs, who were  seeing him for wound healing Sensation Sensation Light Touch: Impaired by gross assessment Light Touch Impaired Details: Impaired RLE;Impaired LLE Stereognosis: Appears Intact Hot/Cold: Appears Intact Proprioception: Appears Intact Coordination Coordination and Movement Description: coordination and  strength deficits, due to MS Heel Shin Test: NT Motor  Motor Motor: Abnormal postural alignment and control;Other (comment) Motor - Skilled Clinical Observations: decreased postural control, decreased balance, decreased strength Motor - Discharge Observations: improved postural control and balance at a S level  Mobility Bed Mobility Bed Mobility: Not assessed Transfers Transfers: Yes Stand Pivot Transfers: 5: Supervision Stand Pivot Transfer Details: Verbal cues for safe use of DME/AE Stand Pivot Transfer Details (indicate cue type and reason): wiht RW Locomotion  Ambulation Ambulation: Yes Ambulation/Gait Assistance: 5: Supervision Ambulation Distance (Feet): 100 Feet Assistive device: Rolling walker Ambulation/Gait Assistance Details: Verbal cues for safe use of DME/AE Gait Gait: Yes Gait Pattern: Impaired Gait Pattern: Wide base of support;Trunk flexed;Decreased dorsiflexion - right;Decreased dorsiflexion - left (RLE hip ER) Gait velocity: Decreased High Level Ambulation High Level Ambulation: Side stepping;Backwards walking Side Stepping: supervision Backwards Walking: close supervision Stairs / Additional Locomotion Stairs: Yes Stairs Assistance: 5: Supervision Stairs Assistance Details: Tactile cues for sequencing Number of Stairs: 12 Height of Stairs: 7 (4 7" high, plus 8 steps 3" high) Ramp: Not tested (comment) Curb: Not tested (comment) Wheelchair Mobility Wheelchair Mobility: Yes Wheelchair Assistance: 6: Modified independent (Device/Increase time);5: Supervision (1 cue at times for route finding in hospital evn) Wheelchair Propulsion: Both upper extremities Wheelchair Parts Management: Needs assistance Distance: 150  Trunk/Postural Assessment  Cervical Assessment Cervical Assessment: Exceptions to [facility] Cervical Strength Overall Cervical Strength Comments: Pt with forward flexed neck Thoracic Assessment Thoracic Assessment: Exceptions to [facility] Thoracic  Strength Overall Thoracic Strength Comments: Forward flexed posture, rounded shoulders, kyphotic posture Lumbar Assessment Lumbar Assessment: Exceptions to [facility] Lumbar Strength Overall Lumbar Strength Comments: Limited trunk mobility and ROM,  posterior pelvic tilt  Postural Control Postural Control: Deficits on evaluation Protective Responses: Pt with limited and inadequate protective responses. Postural Limitations: see above; pt fatigues quickly without UE support, but improved since admission  Balance Static Sitting Balance Static Sitting - Level of Assistance: 7: Independent Dynamic Sitting Balance Sitting balance - Comments: mod I Static Standing Balance Static Standing - Level of Assistance: 5: Stand by assistance Dynamic Standing Balance Dynamic Standing - Balance Support: Left upper extremity supported;Right upper extremity supported Dynamic Standing - Level of Assistance: 5: Stand by assistance Extremity Assessment      RLE Assessment RLE Assessment: Exceptions to [facility] RLE Strength RLE Overall Strength: Deficits;Due to premorbid status (tight HS and HC) RLE Overall Strength Comments: in sitting: hip flex 4+/5, hip ext 4/5, hip abd/add 4/5 ,knee flex 4+/5, knee ext 4/5, ankle DF/PF 4/5 (difficult due to unna boots, and tight HC) LLE Assessment LLE Assessment: Exceptions to [facility] LLE Strength LLE Overall Strength: Deficits;Due to premorbid status (tight HS and HC) LLE Overall Strength Comments: in sitting: hip flex 4+/5, hip ext 4+/5, hip abd/add 4/5,knee flex 4/5, knee ext 4+/5, ankle DF/PF 4+/5 (difficult due to unna boot and tight HC)  See FIM for current functional status  Mirabelle Cyphers 10/19/2014, 5:46 PM

## 2014-10-19 LAB — CREATININE, SERUM
CREATININE: 1.03 mg/dL (ref 0.61–1.24)
GFR calc Af Amer: >60 mL/min (ref >60)
GFR calc non Af Amer: >60 mL/min (ref >60)

## 2014-10-19 MED ORDER — ENSURE ENLIVE PO LIQD: 237.0000 mL | Freq: Every day | ORAL | Status: DC

## 2014-10-19 MED ORDER — BOOST / RESOURCE BREEZE PO LIQD: 1.0000 | Freq: Every day | ORAL | Status: DC

## 2014-10-19 NOTE — Progress Notes (Signed)
Pt discharged to home at 1211 with sister. Discharge instructions given to pt and family from Marissa Nestle, [place] with verbal understanding.

## 2014-10-19 NOTE — Progress Notes (Signed)
[facility]IC[facility]IL[facility]ATION     PROGRESS NOTE    Subjective/Complaints:  No pains or c/os today Brother in Social worker and sister coming in to pick up pt  ROS:  No CP/SOB, no Diarrhea,    Objective: Vital Signs: Blood pressure 130/86, pulse 80, temperature 97.7 F (36.5 C), temperature source Oral, resp. rate 20, weight 110.269 kg (243 lb 1.6 oz), SpO2 99 %. No results found. No results for input(s): WBC, HGB, HCT, PLT in the last 72 hours.  Recent Labs  10/19/14 0517  CREATININE 1.03   CBG (last 3)  No results for input(s): GLUCAP in the last 72 hours.  Wt Readings from Last 3 Encounters:  10/15/14 110.269 kg (243 lb 1.6 oz)  10/05/14 106.278 kg (234 lb 4.8 oz)  11/12/13 99.338 kg (219 lb)    Physical Exam:  Constitutional: He is oriented to person, place, and time. He appears well-developed and well-nourished.  HENT:  Head: Normocephalic and atraumatic.   Eyes: Conjunctivae are normal. . Right eye exhibits no discharge. Left eye exhibits no discharge.  Neck: Normal range of motion. Neck supple. No tracheal deviation present. No thyromegaly present.  Cardiovascular: Normal rate. An irregularly irregular rhythm is present.  No murmur heard. Respiratory: No respiratory distress. He has no wheezes. He has no rales on exam GI: Soft. Bowel sounds are normal. He exhibits no distension. There is no tenderness. There is no rebound and no guarding.  Musculoskeletal: He exhibits edema (1+ R and 2+ Left pedal edema)no  pain with movement of right leg  Neurological: He is alert   . UE's 4/5 delt, tricep, bicep, wrist, HI. RIght LE's 3-HF, 3-KE and 3- ankle df/pf, 4- on left throughout LLE . Cannot eval sensory in feet due to unna boots         Skin: Skin is warm and dry.  Unna boots on BLE appear to be fitting appropriately. Psychiatric: He has a normal mood and affect. His behavior is normal.   Assessment/Plan: 1. Functional deficits secondary to right  inferior pubic ramus fracture, deconditioning after C diff enteritis Stable for D/C today F/u PCP in 1-2 weeks F/u PM&R 3 weeks See D/C summary See D/C instructionsFIM: FIM - Bathing Bathing Steps Patient Completed: Chest, Right Arm, Left Arm, Abdomen, Front perineal area, Buttocks, Right upper leg, Left upper leg Bathing: 5: Supervision: Safety issues/verbal cues  FIM - Upper Body Dressing/Undressing Upper body dressing/undressing steps patient completed: Thread/unthread right sleeve of pullover shirt/dresss, Thread/unthread left sleeve of pullover shirt/dress, Put head through opening of pull over shirt/dress, Pull shirt over trunk Upper body dressing/undressing: 5: Supervision: Safety issues/verbal cues FIM - Lower Body Dressing/Undressing Lower body dressing/undressing steps patient completed: Thread/unthread right underwear leg, Thread/unthread left underwear leg, Pull underwear up/down, Thread/unthread right pants leg, Thread/unthread left pants leg, Don/Doff right sock, Don/Doff left sock Lower body dressing/undressing: 4: Min-Patient completed 75 plus % of tasks  FIM - Toileting Toileting steps completed by patient: Adjust clothing prior to toileting, Performs perineal hygiene, Adjust clothing after toileting Toileting Assistive Devices: Grab bar or rail for support Toileting: 5: Supervision: Safety issues/verbal cues  FIM - Diplomatic Services operational officer Devices: Environmental consultant, Therapist, music Transfers: 5-To toilet/BSC: Supervision (verbal cues/safety issues), 5-From toilet/BSC: Supervision (verbal cues/safety issues)  FIM - Banker Devices: Walker, Arm rests Bed/Chair Transfer: 5: Supine > Sit: Supervision (verbal cues/safety issues), 5: Sit > Supine: Supervision (verbal cues/safety issues)  FIM - Locomotion: Wheelchair  Distance: 60 Locomotion: Wheelchair: 2: Travels 50 - 149 ft with supervision, cueing or coaxing FIM -  Locomotion: Ambulation Locomotion: Ambulation Assistive Devices: Designer, industrial/product Ambulation/Gait Assistance: 4: Min guard, 4: Min assist Locomotion: Ambulation: 2: Travels 50 - 149 ft with minimal assistance (Pt.>75%)  Comprehension Comprehension Mode: Auditory Comprehension: 5-Follows basic conversation/direction: With no assist  Expression Expression Mode: Verbal Expression: 5-Expresses basic needs/ideas: With no assist  Social Interaction Social Interaction: 5-Interacts appropriately 90% of the time - Needs monitoring or encouragement for participation or interaction.  Problem Solving Problem Solving: 5-Solves basic 90% of the time/requires cueing < 10% of the time  Memory Memory: 5-Recognizes or recalls 90% of the time/requires cueing < 10% of the time Medical Problem List and Plan: 1. Functional deficits secondary to pelvic fracture, deconditioning in MS patient related to C diff enteritis 2. DVT Prophylaxis/Anticoagulation: Pharmaceutical: Lovenox prophyllactic dose to d.c. today 3. Pain Management: Tramadol prn effective.  4. Mood: LCSW to follow for evaluation and support.  5. Neuropsych: This patient is capable of making decisions on his own behalf. 6. Stasis ulcers/Skin/Wound Care: Change Unna boots twice a week, had HH RN PTA  -left sided dressing adjusted for comfort--, edema is decreasing 7. Fluids/Electrolytes/Nutrition: Monitor I/O. Continue supplements between meals.   8. C diff colitis: Vancomycin completed 7/14--  -diarrhea resolved - monitor off antibiotic  10 MS with chronic right sided weakness: out of home supply of tecfidera, pt will obtain through [place]                11. HTN: Monitor BP bid.  12. A fib: HR controlled.HR normal at 69 this am  On amiodarone, BB and low dose ASA.No RVR   LOS (Days) 14 A FACE TO FACE EVALUATION WAS PERFORMED  Erick Colace 10/19/2014 7:41 AM

## 2014-10-19 NOTE — Progress Notes (Signed)
Social Work Patient ID: Martin Powell, male   DOB: 08/18/1945, 69 y.o.   MRN: [id number] [facility] attempted to contact [facility] once [facility] and left message for scheduling a follow up appointment with his primary MD. Sister and pt aware have not eben successful with scheduling an appointment. Pt will need to get medicines filled until can get to [place] to have them re-filled. No services available to him due to he has health insurance. Will inform sister again when here to tranport pt home today.

## 2014-10-19 NOTE — Discharge Instructions (Signed)
Inpatient Rehab Discharge Instructions  Martin Powell Discharge date and time:  10/19/14  Activities/Precautions/ Functional Status: Activity: activity as tolerated. Keep legs elevated when seated and elevate when in bed.  Diet: cardiac diet 2 gram salt restrictions.  Wound Care:  Follow up with wound center for change of unna boots.  Functional status:  ___ No restrictions     ___ Walk up steps independently _X__ 24/7 supervision/assistance   ___ Walk up steps with assistance ___ Intermittent supervision/assistance  _X__ Bathe/dress with supervision _X__ Walk with walker    ___ Bathe/dress with assistance ___ Walk Independently    ___ Shower independently _X__ Walk with assistance    ___ Shower with assistance _X__ No alcohol     ___ Return to work/school ________  Special Instructions: 1. Call and make an appointment for follow up with wound clinic in 7-10 days. 2. You need to follow up with your medical doctor in the next two weeks for post hospital follow up.  3. Do not use HCTZ or Prinzide.   COMMUNITY REFERRALS UPON DISCHARGE:    Home Health:   PT, OT, AIDE, SW, RN  Agency:[facility] Phone:[phone]   Date of last service:10/19/2014  Medical Equipment/Items Ordered:BEDSIDE COMMODE  Agency/Supplier:[facility]    [phone]  Other:[facility] TO PROVIDE WIDE ROLLING WALKER-PT TO FOLLOW UP WITH THIS  GENERAL COMMUNITY RESOURCES FOR PATIENT/FAMILY: Support Groups:MS SUPPORT GROUP  My questions have been answered and I understand these instructions. I will adhere to these goals and the provided educational materials after my discharge from the hospital.  Patient/Caregiver Signature _______________________________ Date __________  Clinician Signature _______________________________________ Date __________  Please bring this form and your medication list with you to all your follow-up doctor's appointments.

## 2014-10-19 NOTE — Progress Notes (Signed)
Social Work Discharge Note Discharge Note  The overall goal for the admission was met for:   Discharge location: Yes-HOME WITH INTERMITTENT ASSIST FROM SISTER  Length of Stay: Yes-14 DAYS  Discharge activity level: Yes-SUPERVISION-MOD/I LEVEL  Home/community participation: Yes  Services provided included: MD, RD, PT, OT, RN, CM, TR, Pharmacy, Neuropsych and SW  Financial Services: Medicare and Other: [facility] COVERAGE  Follow-up services arranged: Home Health: [facility]-PT,OT,RN,AIDE,SW, DME: [facility]-BEDSIDE COMMODE and Patient/Family request agency HH: [facility], DME: NO PREF  Comments (or additional information):PT NEEDS ALF, BOTH HE AND SISTER TO PURSUE [facility]. PT NOT ELIGIBLE DUE TO OVER RESERVE. TO [facility] HAVE ATTEMPTED SEVERAL TIMES TO MAKE FOLLOW UP APPOINTMENT WITH PT'S PRIMARY MD WITHOUT SUCCESS-SEVERAL MESSAGES LEFT. PT AND SISTER AWARE OF THIS. WILL NEED TO GET MEDICINES  UNTIL CAN GET TO [facility] TO OB[facility] THEM THERE. NO ASSISTANCE AVAILABLE DUE TO HAS HEALTH INSURANCE. MOBILE MEALS REFERRAL MADE-WILL CONTACT WITHIN THREE DAYS TO ASSESS IF ELIGIBLE.  Patient/Family verbalized understanding of follow-up arrangements: Yes  Individual responsible for coordination of the follow-up plan: SELF & JACKIE-SISTER  Confirmed correct DME delivered: Elease Hashimoto 10/19/2014    Elease Hashimoto

## 2014-10-19 NOTE — Progress Notes (Signed)
Orthopedic Tech Progress Note Patient Details:  Martin Powell 11/02/1945 [id number]  Ortho Devices Type of Ortho Device: Roland Rack boot Ortho Device/Splint Location: bilateral Ortho Device/Splint Interventions: Application   Nikki Dom 10/19/2014, 9:23 AM

## 2014-10-19 NOTE — Discharge Summary (Signed)
d Physician Discharge Summary  Patient ID: Martin Powell MRN: [id number] DOB/AGE: 06/03/1945 69 y.o.  Admit date: 10/05/2014 Discharge date: 10/19/2014  Discharge Diagnoses:  Principal Problem:   Physical deconditioning Active Problems:   Hypothyroidism   Multiple sclerosis   Enteritis due to Clostridium difficile   Fracture of right inferior pubic ramus   Discharged Condition: Stable  Significant Diagnostic Studies: Dg Pelvis 1-2 Views  10/01/2014   CLINICAL DATA:  Fall with pelvic pain  EXAM: PELVIS - 1-2 VIEW  COMPARISON:  None.  FINDINGS: Pelvic ring is intact. Lucency is noted through the midportion of the pubic symphysis on the right which may represent undisplaced fracture. Correlation with physical exam is recommended. If necessary CT may be helpful for further evaluation. Mild degenerative changes of the hip joints are noted bilaterally. Degenerative changes of lumbar spine are seen as well.  IMPRESSION: Lucency within the pubic symphysis on the right which may represent undisplaced fracture. Correlation with the physical exam is recommended.   Electronically Signed   By: Martin Powell M.D.   On: 10/01/2014 01:07   Ct Head Wo Contrast  10/01/2014   CLINICAL DATA:  Larey Seat this morning at home, hitting back of head. Anticoagulated.  EXAM: CT HEAD WITHOUT CONTRAST  TECHNIQUE: Contiguous axial images were obtained from the base of the skull through the vertex without intravenous contrast.  COMPARISON:  11/12/2013  FINDINGS: There is no intracranial hemorrhage or extra-axial fluid collection. There is moderate generalized atrophy. There is white matter hypodensity consistent with chronic small vessel ischemic disease. There are lacunar infarctions in the basal ganglia bilaterally, chronic. No acute findings are evident. Calvarium and skullbase are intact.  IMPRESSION: Negative for acute intracranial traumatic injury. There is moderate generalized atrophy, chronic microvascular ischemic disease and  old lacunar infarctions.   Electronically Signed   By: Martin Powell M.D.   On: 10/01/2014 00:39    Labs:  Basic Metabolic Panel:  Recent Labs Lab 10/12/14 1711 10/19/14 0517  NA 137  --   K 3.8  --   CL 102  --   CO2 28  --   GLUCOSE 103*  --   BUN 14  --   CREATININE 1.11 1.03  CALCIUM 8.8*  --     CBC: CBC Latest Ref Rng 10/06/2014 10/04/2014 10/02/2014  WBC 4.0 - 10.5 K/uL 10.9(H) 11.8(H) 7.6  Hemoglobin 13.0 - 17.0 g/dL 12.8(L) 13.0 11.8(L)  Hematocrit 39.0 - 52.0 % 41.0 40.1 36.3(L)  Platelets 150 - 400 K/uL 208 229 125(L)     CBG: No results for input(s): GLUCAP in the last 168 hours.  Brief HPI:   Martin Powell is a 69 y.o. male with MS and chronic right sided weakness, chronic BLE wounds (treated recently with clindamycin) who was admitted 09/29/14 due to recurrent fall after multiple episodes of diarrhea. He was found hypotensive in his house by EMS and required multiple fluid boluses for BP stabilization. He has complaints of right pelvis/thigh after fall and Xrays with question of nondisplaced right pubic symphysis fracture. He was found to have c diff enteritis with reactive leucocytosis and hypokalemia. Dr. Roda Powell recommended conservative care for pelvic fracture and he was started on po vancomycin with recommendations for two week treatment to end on 07/14. Unna boots placed on BLE ulcers and to be changed 2 x week per WOC. This patient failed to progress and CIR was recommended By Rehab team   Hospital Course: Martin Powell was admitted to rehab 10/05/2014 for inpatient  therapies to consist of PT and OT at least three hours five days a week. Past admission physiatrist, therapy team and rehab RN have worked together to provide customized collaborative inpatient rehab.  Blood pressures have been stable and po intake has been good. Heart rate has been controlled on low dose amiodarone and metoprolol. Diarrhea has resolved and he completed treatment of c diff on 07/14 without  side effects. Follow up CBC showed reactive leucocytosis is resolving. Unna boots were changed twice a week.   BLE wounds have healed nicely and 2+ edema have been managed with use of Unna Boots. He had run out of his home dose of tecfidera during his stay and was awaiting supply from [place]. He has made steady progress and is currently at supervision level. He will continue to receive follow up HHPT, HHOT, HHRN and HH aide by [facility].    Rehab course: During patient's stay in rehab weekly team conferences were held to monitor patient's progress, set goals and discuss barriers to discharge. At admission, patient required min to total assist with ADL tasks and max assist with mobility. He  has had improvement in activity tolerance, balance, postural control, as well as ability to compensate for deficits. He is able to complete ADL tasks with supervision. He is able to perform transfers and ambulate 100' with RW and supervision. Family education was done with sister who will check in past discharge.    Disposition: Home  Diet: Regular  Special Instructions: 1. Call and make an appointment for follow up with wound clinic in 7-10 days. 2. Make an appointment with PMD in the next two weeks for post hospital follow up.  3. Do not use HCTZ or Prinzide.     Medication List    STOP taking these medications        vancomycin 50 mg/mL oral solution  Commonly known as:  VANCOCIN      TAKE these medications        acetaminophen 325 MG tablet  Commonly known as:  TYLENOL  Take 325 mg by mouth every 6 (six) hours as needed for mild pain or moderate pain.     amiodarone 200 MG tablet  Commonly known as:  PACERONE  Take 200 mg by mouth daily.     aspirin 81 MG tablet  Take 81 mg by mouth daily.     atorvastatin 80 MG tablet  Commonly known as:  LIPITOR  Take 80 mg by mouth daily.     feeding supplement (ENSURE ENLIVE) Liqd  Take 237 mLs by mouth daily.     feeding supplement Liqd   Take 1 Container by mouth daily at 3 pm.     gabapentin 300 MG capsule  Commonly known as:  NEURONTIN  Take 300 mg by mouth 3 (three) times daily.     levothyroxine 125 MCG tablet  Commonly known as:  SYNTHROID, LEVOTHROID  Take 125 mcg by mouth daily before breakfast.     metoprolol 50 MG tablet  Commonly known as:  LOPRESSOR  Take 50 mg by mouth 2 (two) times daily.     TECFIDERA 240 MG Cpdr  Generic drug:  Dimethyl Fumarate  Take 240 mg by mouth 2 (two) times daily.     Vitamin D3 2000 UNITS Tabs  Take 1 tablet by mouth daily.           Follow-up Information    Call Erick Colace, MD.   Specialty:  Physical Medicine and Rehabilitation  Why:  As needed   Contact information:   [address] [place] [address] [phone]       Call Cheral Almas, MD.   Specialty:  Orthopedic Surgery   Why:  As needed for follow up on pelvic fracture.    Contact information:   [address] [place] [address] [phone]       Signed: Jacquelynn Cree 10/19/2014, 11:50 AM

## 2014-11-08 ENCOUNTER — Emergency Department (HOSPITAL_BASED_OUTPATIENT_CLINIC_OR_DEPARTMENT_OTHER)
Admission: EM | Admit: 2014-11-08 | Discharge: 2014-11-09 | Disposition: A | Discharge status: Home / Self Care | Payer: Non-veteran care | Attending: Emergency Medicine | Admitting: Emergency Medicine

## 2014-11-08 ENCOUNTER — Emergency Department (HOSPITAL_BASED_OUTPATIENT_CLINIC_OR_DEPARTMENT_OTHER): Payer: Non-veteran care

## 2014-11-08 ENCOUNTER — Encounter (HOSPITAL_BASED_OUTPATIENT_CLINIC_OR_DEPARTMENT_OTHER): Payer: Self-pay | Admitting: *Deleted

## 2014-11-08 DIAGNOSIS — E780 Pure hypercholesterolemia: Secondary | ICD-10-CM | POA: Diagnosis not present

## 2014-11-08 DIAGNOSIS — Z8669 Personal history of other diseases of the nervous system and sense organs: Secondary | ICD-10-CM | POA: Diagnosis not present

## 2014-11-08 DIAGNOSIS — Y998 Other external cause status: Secondary | ICD-10-CM | POA: Insufficient documentation

## 2014-11-08 DIAGNOSIS — E079 Disorder of thyroid, unspecified: Secondary | ICD-10-CM | POA: Insufficient documentation

## 2014-11-08 DIAGNOSIS — Z79899 Other long term (current) drug therapy: Secondary | ICD-10-CM | POA: Insufficient documentation

## 2014-11-08 DIAGNOSIS — Z8719 Personal history of other diseases of the digestive system: Secondary | ICD-10-CM | POA: Insufficient documentation

## 2014-11-08 DIAGNOSIS — Z7982 Long term (current) use of aspirin: Secondary | ICD-10-CM | POA: Insufficient documentation

## 2014-11-08 DIAGNOSIS — S99911A Unspecified injury of right ankle, initial encounter: Secondary | ICD-10-CM | POA: Diagnosis present

## 2014-11-08 DIAGNOSIS — W010XXA Fall on same level from slipping, tripping and stumbling without subsequent striking against object, initial encounter: Secondary | ICD-10-CM | POA: Diagnosis not present

## 2014-11-08 DIAGNOSIS — R011 Cardiac murmur, unspecified: Secondary | ICD-10-CM | POA: Insufficient documentation

## 2014-11-08 DIAGNOSIS — I10 Essential (primary) hypertension: Secondary | ICD-10-CM | POA: Diagnosis not present

## 2014-11-08 DIAGNOSIS — Y92009 Unspecified place in unspecified non-institutional (private) residence as the place of occurrence of the external cause: Secondary | ICD-10-CM | POA: Diagnosis not present

## 2014-11-08 DIAGNOSIS — Y9389 Activity, other specified: Secondary | ICD-10-CM | POA: Diagnosis not present

## 2014-11-08 DIAGNOSIS — S99921A Unspecified injury of right foot, initial encounter: Secondary | ICD-10-CM

## 2014-11-08 LAB — URINALYSIS, ROUTINE W REFLEX MICROSCOPIC
Bilirubin Urine: NEGATIVE
Glucose, UA: NEGATIVE mg/dL
Hgb urine dipstick: NEGATIVE
Ketones, ur: NEGATIVE mg/dL
LEUKOCYTES UA: NEGATIVE
Nitrite: NEGATIVE
Protein, ur: NEGATIVE mg/dL
Specific Gravity, Urine: 1.008 (ref 1.005–1.030)
Urobilinogen, UA: 0.2 mg/dL (ref 0.0–1.0)
pH: 7.5 (ref 5.0–8.0)

## 2014-11-08 MED ORDER — HYDROCODONE-ACETAMINOPHEN 5-325 MG PO TABS
1.0000 | ORAL_TABLET | Freq: Four times a day (QID) | ORAL | Status: DC | PRN
Start: 2014-11-08 — End: 2015-04-18

## 2014-11-08 MED ORDER — HYDROCODONE-ACETAMINOPHEN 5-325 MG PO TABS
1.0000 | ORAL_TABLET | Freq: Once | ORAL | Status: AC
  Administered 2014-11-08: 1 via ORAL
  Filled 2014-11-08: qty 1

## 2014-11-08 NOTE — ED Notes (Signed)
Pt reports fall this am with right ankle injury.

## 2014-11-08 NOTE — ED Notes (Signed)
MD at bedside. 

## 2014-11-08 NOTE — Discharge Instructions (Signed)
Wear cam walker for comfort. You may take it off.  Take motrin for pain.   Take vicodin for severe pain.   See your doctor.  Return to ER if you have severe pain, unable to bear weight on the leg, more leg swelling.

## 2014-11-08 NOTE — ED Notes (Signed)
tct ptar for update on time of arrival

## 2014-11-08 NOTE — ED Notes (Signed)
Sister updated on poc, informed that patient was being sent home via ptar. Ok with plan of discharge, plan to updated hhrn when patient arrives home via sister

## 2014-11-08 NOTE — ED Provider Notes (Signed)
CSN: [id number]     Arrival date & time 11/08/14  1758 History  This chart was scribed for Richardean Canal, MD by Lyndel Safe, ED Scribe. This patient was seen in room MH11/MH11 and the patient's care was started 6:40 PM.   Chief Complaint  Patient presents with  . Ankle Injury   The history is provided by the patient. No language interpreter was used.   HPI Comments: Josiah Nieto is a 69 y.o. male, with a PMhx of MS, who presents to the Emergency Department complaining of constant, moderate right ankle and foot pain and swelling s/p fall. Pt reports he tripped over a chair at home when he fell injuring his right ankle and foot this morning. Pt has MS and notes edema and erythema at baseline but states the swelling is worse to his right foot. Pt is taking oral medication for his MS. He is ambulatory with a walker at home and is currently wearing a stabilizing boot on BLE. Denies a PMhx of DM.   Past Medical History  Diagnosis Date  . Hypertension   . GERD (gastroesophageal reflux disease)   . Hypercholesteremia   . Thyroid activity decreased   . Multiple sclerosis   . Murmur, heart    Past Surgical History  Procedure Laterality Date  . Appendectomy     Family History  Problem Relation Age of Onset  . CAD Neg Hx    History  Substance Use Topics  . Smoking status: Never Smoker   . Smokeless tobacco: Not on file  . Alcohol Use: No    Review of Systems  Musculoskeletal: Positive for arthralgias.  All other systems reviewed and are negative.  Allergies  Review of patient's allergies indicates no known allergies.  Home Medications   Prior to Admission medications   Medication Sig Start Date End Date Taking? Authorizing Provider  acetaminophen (TYLENOL) 325 MG tablet Take 325 mg by mouth every 6 (six) hours as needed for mild pain or moderate pain.    Historical Provider, MD  amiodarone (PACERONE) 200 MG tablet Take 200 mg by mouth daily.    Historical Provider, MD  aspirin 81  MG tablet Take 81 mg by mouth daily.    Historical Provider, MD  atorvastatin (LIPITOR) 80 MG tablet Take 80 mg by mouth daily.    Historical Provider, MD  Cholecalciferol (VITAMIN D3) 2000 UNITS TABS Take 1 tablet by mouth daily.    Historical Provider, MD  Dimethyl Fumarate (TECFIDERA) 240 MG CPDR Take 240 mg by mouth 2 (two) times daily.     Historical Provider, MD  feeding supplement (BOOST / RESOURCE BREEZE) LIQD Take 1 Container by mouth daily at 3 pm. 10/19/14   Evlyn Kanner Love, PA-C  feeding supplement, ENSURE ENLIVE, (ENSURE ENLIVE) LIQD Take 237 mLs by mouth daily. 10/19/14   Jacquelynn Cree, PA-C  gabapentin (NEURONTIN) 300 MG capsule Take 300 mg by mouth 3 (three) times daily.    Historical Provider, MD  levothyroxine (SYNTHROID, LEVOTHROID) 125 MCG tablet Take 125 mcg by mouth daily before breakfast.    Historical Provider, MD  metoprolol (LOPRESSOR) 50 MG tablet Take 50 mg by mouth 2 (two) times daily.    Historical Provider, MD   BP 168/106 mmHg  Pulse 106  Temp(Src) 98.8 F (37.1 C) (Oral)  Resp 18  Ht  (1.854 m)  Wt 223 lb (101.152 kg)  BMI 29.43 kg/m2  SpO2 97% Physical Exam  Constitutional: He appears well-developed and well-nourished. No  distress.  HENT:  Head: Normocephalic.  Eyes: Conjunctivae are normal. Right eye exhibits no discharge. Left eye exhibits no discharge. No scleral icterus.  Neck: No JVD present.  Pulmonary/Chest: Effort normal. No respiratory distress.  Musculoskeletal:  Diffuse redness and swelling of right foot and ankle, mild tenderness to dorsum of the foot and ankle. Resolving ulcers to right foot with no active infection   Neurological: He is alert. Coordination normal.  Skin: Skin is warm. No rash noted. No erythema. No pallor.  Psychiatric: He has a normal mood and affect. His behavior is normal.  Nursing note and vitals reviewed.   ED Course  Procedures  DIAGNOSTIC STUDIES: Oxygen Saturation is 97% on RA, normal by my interpretation.     COORDINATION OF CARE: 6:42 PM Discussed treatment plan which includes to order CT of right ankle and foot with pt. Discussed Xray of right ankle with pt. Pt acknowledges and agrees to plan.   Labs Review Labs Reviewed - No data to display  Imaging Review Dg Ankle Complete Right  11/08/2014   CLINICAL DATA:  69 year old male post fall. Medial and lateral pain. Initial encounter.  EXAM: RIGHT ANKLE - COMPLETE 3+ VIEW  COMPARISON:  None.  FINDINGS: Lucency projecting over the talus. A portion of this appears well corticated and this may represent a remote injury however, given the patient's history and soft tissue swelling in the medial and lateral malleolar region, CT recommended to help exclude acute injury.  Soft tissue prominence lower right leg (medially) and may represent artifact or result of soft tissue injury.  Prominent plantar spur.  IMPRESSION: Question injury of the talus as noted above. CT may be considered for further delineation   Electronically Signed   By: Lacy Duverney M.D.   On: 11/08/2014 18:29   Ct Ankle Right Wo Contrast  11/08/2014   CLINICAL DATA:  Status post fall this morning. Hit chair causing and a fall.  EXAM: CT OF THE RIGHT ANKLE WITHOUT CONTRAST  CT OF THE RIGHT FOOT WITHOUT CONTRAST  TECHNIQUE: Multidetector CT imaging of the right ankle was performed according to the standard protocol. Multiplanar CT image reconstructions were also generated.  COMPARISON:  None.  FINDINGS: There is no acute fracture or dislocation. There is generalized osteopenia. There is no lytic or sclerotic osseous lesion. There are degenerative changes of the scratch head there are mild degenerative changes of the middle subtalar joint. The posterior subtalar joint is normal. There is a small plantar calcaneal spur. There is mild osteoarthritis of the first MTP joint. There is mild osteoarthritis of the third DIP joint.  There is circumferential edema in the subcutaneous fat around the ankle and  foot. There is no focal fluid collection or hematoma. The flexor, extensor and peroneal tendons are intact. The Achilles tendon is intact.  IMPRESSION: No acute osseous injury of the right foot and ankle.   Electronically Signed   By: Elige Ko   On: 11/08/2014 20:22   Ct Foot Right Wo Contrast  11/08/2014   CLINICAL DATA:  Status post fall this morning. Hit chair causing and a fall.  EXAM: CT OF THE RIGHT ANKLE WITHOUT CONTRAST  CT OF THE RIGHT FOOT WITHOUT CONTRAST  TECHNIQUE: Multidetector CT imaging of the right ankle was performed according to the standard protocol. Multiplanar CT image reconstructions were also generated.  COMPARISON:  None.  FINDINGS: There is no acute fracture or dislocation. There is generalized osteopenia. There is no lytic or sclerotic osseous lesion. There  are degenerative changes of the scratch head there are mild degenerative changes of the middle subtalar joint. The posterior subtalar joint is normal. There is a small plantar calcaneal spur. There is mild osteoarthritis of the first MTP joint. There is mild osteoarthritis of the third DIP joint.  There is circumferential edema in the subcutaneous fat around the ankle and foot. There is no focal fluid collection or hematoma. The flexor, extensor and peroneal tendons are intact. The Achilles tendon is intact.  IMPRESSION: No acute osseous injury of the right foot and ankle.   Electronically Signed   By: Elige Ko   On: 11/08/2014 20:22     EKG Interpretation None      MDM   Final diagnoses:  Foot injury, right, initial encounter    Ahnaf Caponi is a 69 y.o. male here with fall with R ankle injury. Likely fracture vs ankle sprain. Has chronic lower extremity redness from venous stasis with no active infection. Will get ankle xray.  8:37 PM Xray showed possible fracture. CT showed no fracture. Patient lives at home. Has home health aid and nurse and PT. Not weaker then usual. I doubt MS exacerbation. Likely just  pain from ankle sprain. Will dc home with short course of vicodin. Applied cam walker to help him bear weight on the foot.   I personally performed the services described in this documentation, which was scribed in my presence. The recorded information has been reviewed and is accurate.   Richardean Canal, MD 11/08/14 2039

## 2014-11-08 NOTE — ED Notes (Signed)
[facility] called for transport home. 

## 2014-11-09 NOTE — ED Notes (Signed)
Went to give pt the urinal again. Pt was very belligerent yelling at me that he wanted to leave this place. I advised him that we had no control we were doing the best we could and we had called multiple times and the pt was still yelling at me. Charge nurse came into the room due to the commotion.

## 2014-11-09 NOTE — ED Notes (Signed)
Pt upset and yelling "screaming and yelling and every person who enters room". States we are holding him hostage and he wants to leave right now. Informed patient that we have no control over transport services and when they arrive. We personally called and requested updates on transportation arrival, multiple times and relayed information to patient. Called patient's sister Roosvelt Harps at [phone] and requested that she return to pick patient up at his request. As he is now refusing to await for ptar arrival. Sister verbally agreed and stated that this is how he reacts when he does not get what he wants.

## 2014-11-10 LAB — URINE CULTURE

## 2015-01-05 DIAGNOSIS — I872 Venous insufficiency (chronic) (peripheral): Secondary | ICD-10-CM | POA: Insufficient documentation

## 2015-02-08 DIAGNOSIS — Z8673 Personal history of transient ischemic attack (TIA), and cerebral infarction without residual deficits: Secondary | ICD-10-CM | POA: Insufficient documentation

## 2015-04-13 ENCOUNTER — Emergency Department (HOSPITAL_BASED_OUTPATIENT_CLINIC_OR_DEPARTMENT_OTHER): Payer: Medicare Other

## 2015-04-13 ENCOUNTER — Inpatient Hospital Stay (HOSPITAL_BASED_OUTPATIENT_CLINIC_OR_DEPARTMENT_OTHER)
Admission: EM | Admit: 2015-04-13 | Discharge: 2015-04-18 | DRG: 872 | Disposition: A | Discharge status: Skilled Nursing Facility | Payer: Medicare Other | Attending: Internal Medicine | Admitting: Internal Medicine

## 2015-04-13 ENCOUNTER — Encounter (HOSPITAL_BASED_OUTPATIENT_CLINIC_OR_DEPARTMENT_OTHER): Payer: Self-pay | Admitting: *Deleted

## 2015-04-13 DIAGNOSIS — I4891 Unspecified atrial fibrillation: Secondary | ICD-10-CM | POA: Diagnosis present

## 2015-04-13 DIAGNOSIS — I10 Essential (primary) hypertension: Secondary | ICD-10-CM | POA: Diagnosis present

## 2015-04-13 DIAGNOSIS — E039 Hypothyroidism, unspecified: Secondary | ICD-10-CM | POA: Diagnosis present

## 2015-04-13 DIAGNOSIS — N4889 Other specified disorders of penis: Secondary | ICD-10-CM | POA: Diagnosis present

## 2015-04-13 DIAGNOSIS — L03116 Cellulitis of left lower limb: Secondary | ICD-10-CM | POA: Diagnosis present

## 2015-04-13 DIAGNOSIS — L03119 Cellulitis of unspecified part of limb: Secondary | ICD-10-CM | POA: Diagnosis not present

## 2015-04-13 DIAGNOSIS — E785 Hyperlipidemia, unspecified: Secondary | ICD-10-CM | POA: Diagnosis present

## 2015-04-13 DIAGNOSIS — Z7982 Long term (current) use of aspirin: Secondary | ICD-10-CM

## 2015-04-13 DIAGNOSIS — I272 Other secondary pulmonary hypertension: Secondary | ICD-10-CM | POA: Diagnosis present

## 2015-04-13 DIAGNOSIS — M4802 Spinal stenosis, cervical region: Secondary | ICD-10-CM | POA: Diagnosis present

## 2015-04-13 DIAGNOSIS — G35D Multiple sclerosis, unspecified: Secondary | ICD-10-CM

## 2015-04-13 DIAGNOSIS — E7800 Pure hypercholesterolemia, unspecified: Secondary | ICD-10-CM | POA: Diagnosis present

## 2015-04-13 DIAGNOSIS — I361 Nonrheumatic tricuspid (valve) insufficiency: Secondary | ICD-10-CM | POA: Diagnosis not present

## 2015-04-13 DIAGNOSIS — K219 Gastro-esophageal reflux disease without esophagitis: Secondary | ICD-10-CM | POA: Diagnosis present

## 2015-04-13 DIAGNOSIS — E038 Other specified hypothyroidism: Secondary | ICD-10-CM | POA: Diagnosis not present

## 2015-04-13 DIAGNOSIS — G35 Multiple sclerosis: Secondary | ICD-10-CM

## 2015-04-13 DIAGNOSIS — N5089 Other specified disorders of the male genital organs: Secondary | ICD-10-CM

## 2015-04-13 DIAGNOSIS — R609 Edema, unspecified: Secondary | ICD-10-CM

## 2015-04-13 DIAGNOSIS — Z79899 Other long term (current) drug therapy: Secondary | ICD-10-CM

## 2015-04-13 DIAGNOSIS — L0390 Cellulitis, unspecified: Secondary | ICD-10-CM

## 2015-04-13 DIAGNOSIS — A419 Sepsis, unspecified organism: Secondary | ICD-10-CM | POA: Diagnosis present

## 2015-04-13 DIAGNOSIS — H02409 Unspecified ptosis of unspecified eyelid: Secondary | ICD-10-CM

## 2015-04-13 DIAGNOSIS — L03115 Cellulitis of right lower limb: Secondary | ICD-10-CM | POA: Diagnosis present

## 2015-04-13 LAB — COMPREHENSIVE METABOLIC PANEL
ALK PHOS: 85 U/L (ref 38–126)
ALT: 13 U/L — AB (ref 17–63)
AST: 22 U/L (ref 15–41)
Albumin: 3.4 g/dL — ABNORMAL LOW (ref 3.5–5.0)
Anion gap: 7 (ref 5–15)
BILIRUBIN TOTAL: 1.1 mg/dL (ref 0.3–1.2)
BUN: 15 mg/dL (ref 6–20)
CALCIUM: 8.8 mg/dL — AB (ref 8.9–10.3)
CO2: 26 mmol/L (ref 22–32)
CREATININE: 0.92 mg/dL (ref 0.61–1.24)
Chloride: 105 mmol/L (ref 101–111)
GFR calc non Af Amer: >60 mL/min (ref >60)
Glucose, Bld: 118 mg/dL — ABNORMAL HIGH (ref 65–99)
Potassium: 4 mmol/L (ref 3.5–5.1)
SODIUM: 138 mmol/L (ref 135–145)
TOTAL PROTEIN: 7.2 g/dL (ref 6.5–8.1)

## 2015-04-13 LAB — CBC WITH DIFFERENTIAL/PLATELET
Basophils Absolute: 0.1 10*3/uL (ref 0.0–0.1)
Basophils Relative: 0 %
Eosinophils Absolute: 0.1 10*3/uL (ref 0.0–0.7)
Eosinophils Relative: 0 %
HEMATOCRIT: 36.9 % — AB (ref 39.0–52.0)
Hemoglobin: 11.6 g/dL — ABNORMAL LOW (ref 13.0–17.0)
LYMPHS PCT: 4 %
Lymphs Abs: 0.8 10*3/uL (ref 0.7–4.0)
MCH: 28.2 pg (ref 26.0–34.0)
MCHC: 31.4 g/dL (ref 30.0–36.0)
MCV: 89.6 fL (ref 78.0–100.0)
Monocytes Absolute: 1.3 10*3/uL — ABNORMAL HIGH (ref 0.1–1.0)
Monocytes Relative: 7 %
NEUTROS ABS: 16.9 10*3/uL — AB (ref 1.7–7.7)
Neutrophils Relative %: 89 %
Platelets: 233 10*3/uL (ref 150–400)
RBC: 4.12 MIL/uL — AB (ref 4.22–5.81)
RDW: 14.2 % (ref 11.5–15.5)
WBC: 19.1 10*3/uL — ABNORMAL HIGH (ref 4.0–10.5)

## 2015-04-13 LAB — I-STAT CG4 LACTIC ACID, ED: Lactic Acid, Venous: 1.38 mmol/L (ref 0.5–2.0)

## 2015-04-13 LAB — PROTIME-INR
INR: 1.75 — AB (ref 0.00–1.49)
Prothrombin Time: 20.4 seconds — ABNORMAL HIGH (ref 11.6–15.2)

## 2015-04-13 MED ORDER — VANCOMYCIN HCL IN DEXTROSE 1-5 GM/200ML-% IV SOLN
1000.0000 mg | INTRAVENOUS | Status: DC
  Filled 2015-04-13: qty 200

## 2015-04-13 MED ORDER — PIPERACILLIN-TAZOBACTAM 3.375 G IVPB 30 MIN
3.3750 g | Freq: Once | INTRAVENOUS | Status: AC
  Administered 2015-04-13: 3.375 g via INTRAVENOUS
  Filled 2015-04-13 (×2): qty 50

## 2015-04-13 MED ORDER — ENOXAPARIN SODIUM 40 MG/0.4ML ~~LOC~~ SOLN
40.0000 mg | Freq: Every day | SUBCUTANEOUS | Status: DC
  Administered 2015-04-14: 40 mg via SUBCUTANEOUS
  Filled 2015-04-13: qty 0.4

## 2015-04-13 MED ORDER — METOPROLOL TARTRATE 50 MG PO TABS
50.0000 mg | ORAL_TABLET | Freq: Two times a day (BID) | ORAL | Status: DC
  Administered 2015-04-14 – 2015-04-15 (×4): 50 mg via ORAL
  Filled 2015-04-13 (×3): qty 1

## 2015-04-13 MED ORDER — DIMETHYL FUMARATE 240 MG PO CPDR
240.0000 mg | DELAYED_RELEASE_CAPSULE | Freq: Two times a day (BID) | ORAL | Status: DC
Start: 2015-04-13 — End: 2015-04-18
  Administered 2015-04-15 – 2015-04-18 (×7): 240 mg via ORAL
  Filled 2015-04-13 (×4): qty 1

## 2015-04-13 MED ORDER — ACETAMINOPHEN 325 MG PO TABS
650.0000 mg | ORAL_TABLET | Freq: Once | ORAL | Status: AC
  Administered 2015-04-13: 650 mg via ORAL

## 2015-04-13 MED ORDER — VANCOMYCIN HCL IN DEXTROSE 1-5 GM/200ML-% IV SOLN: 1000.0000 mg | Freq: Once | INTRAVENOUS | Status: DC

## 2015-04-13 MED ORDER — ONDANSETRON HCL 4 MG/2ML IJ SOLN: 4.0000 mg | Freq: Four times a day (QID) | INTRAMUSCULAR | Status: DC | PRN

## 2015-04-13 MED ORDER — SODIUM CHLORIDE 0.9 % IV BOLUS (SEPSIS)
1000.0000 mL | Freq: Once | INTRAVENOUS | Status: AC
  Administered 2015-04-13: 1000 mL via INTRAVENOUS

## 2015-04-13 MED ORDER — VANCOMYCIN HCL IN DEXTROSE 750-5 MG/150ML-% IV SOLN: 750.0000 mg | Freq: Two times a day (BID) | INTRAVENOUS | Status: DC

## 2015-04-13 MED ORDER — ACETAMINOPHEN 325 MG PO TABS
650.0000 mg | ORAL_TABLET | Freq: Four times a day (QID) | ORAL | Status: DC | PRN
  Administered 2015-04-14 – 2015-04-18 (×11): 650 mg via ORAL
  Filled 2015-04-13 (×10): qty 2

## 2015-04-13 MED ORDER — AMIODARONE HCL 200 MG PO TABS
200.0000 mg | ORAL_TABLET | Freq: Every day | ORAL | Status: DC
  Administered 2015-04-14 – 2015-04-15 (×2): 200 mg via ORAL
  Filled 2015-04-13 (×2): qty 1

## 2015-04-13 MED ORDER — GABAPENTIN 300 MG PO CAPS
300.0000 mg | ORAL_CAPSULE | Freq: Three times a day (TID) | ORAL | Status: DC
  Administered 2015-04-14 – 2015-04-18 (×14): 300 mg via ORAL
  Filled 2015-04-13 (×14): qty 1

## 2015-04-13 MED ORDER — LEVOTHYROXINE SODIUM 25 MCG PO TABS
125.0000 ug | ORAL_TABLET | Freq: Every day | ORAL | Status: DC
  Administered 2015-04-14 – 2015-04-15 (×2): 125 ug via ORAL
  Filled 2015-04-13 (×2): qty 1

## 2015-04-13 MED ORDER — ACETAMINOPHEN 650 MG RE SUPP: 650.0000 mg | Freq: Four times a day (QID) | RECTAL | Status: DC | PRN

## 2015-04-13 MED ORDER — VANCOMYCIN HCL IN DEXTROSE 750-5 MG/150ML-% IV SOLN
750.0000 mg | Freq: Two times a day (BID) | INTRAVENOUS | Status: DC
  Filled 2015-04-13: qty 150

## 2015-04-13 MED ORDER — PIPERACILLIN-TAZOBACTAM 3.375 G IVPB
3.3750 g | Freq: Three times a day (TID) | INTRAVENOUS | Status: DC
  Administered 2015-04-14 – 2015-04-16 (×9): 3.375 g via INTRAVENOUS
  Filled 2015-04-13 (×10): qty 50

## 2015-04-13 MED ORDER — PIPERACILLIN-TAZOBACTAM 3.375 G IVPB 30 MIN
3.3750 g | Freq: Once | INTRAVENOUS | Status: DC
  Filled 2015-04-13: qty 50

## 2015-04-13 MED ORDER — ATORVASTATIN CALCIUM 80 MG PO TABS
80.0000 mg | ORAL_TABLET | Freq: Every day | ORAL | Status: DC
  Administered 2015-04-14 – 2015-04-17 (×4): 80 mg via ORAL
  Filled 2015-04-13 (×4): qty 1

## 2015-04-13 MED ORDER — SODIUM CHLORIDE 0.9 % IV SOLN
INTRAVENOUS | Status: DC
  Administered 2015-04-14 (×2): via INTRAVENOUS

## 2015-04-13 MED ORDER — VANCOMYCIN HCL IN DEXTROSE 1-5 GM/200ML-% IV SOLN
1000.0000 mg | Freq: Once | INTRAVENOUS | Status: AC
  Administered 2015-04-13: 1000 mg via INTRAVENOUS
  Filled 2015-04-13: qty 200

## 2015-04-13 MED ORDER — ONDANSETRON HCL 4 MG PO TABS
4.0000 mg | ORAL_TABLET | Freq: Four times a day (QID) | ORAL | Status: DC | PRN
Start: 2015-04-13 — End: 2015-04-18

## 2015-04-13 MED ORDER — ASPIRIN 81 MG PO CHEW: 81.0000 mg | CHEWABLE_TABLET | Freq: Every day | ORAL | Status: DC

## 2015-04-13 MED ORDER — ACETAMINOPHEN 325 MG PO TABS
ORAL_TABLET | ORAL | Status: AC
  Administered 2015-04-13: 650 mg via ORAL
  Filled 2015-04-13: qty 2

## 2015-04-13 MED ORDER — VANCOMYCIN HCL 10 G IV SOLR
1250.0000 mg | Freq: Two times a day (BID) | INTRAVENOUS | Status: DC
  Administered 2015-04-14 – 2015-04-18 (×9): 1250 mg via INTRAVENOUS
  Filled 2015-04-13 (×10): qty 1250

## 2015-04-13 NOTE — Progress Notes (Addendum)
ANTIBIOTIC CONSULT NOTE - INITIAL  Pharmacy Consult for vanc/zosyn Indication: cellulitis  No Known Allergies  Patient Measurements: Height: 6\' 3"  (190.5 cm) Weight: 242 lb (109.77 kg) IBW/kg (Calculated) : 84.5 Adjusted Body Weight:   Vital Signs: Temp: 101.3 F (38.5 C) (01/11 1446) Temp Source: Oral (01/11 1446) BP: 146/77 mmHg (01/11 1446) Pulse Rate: 92 (01/11 1446) Intake/Output from previous day:   Intake/Output from this shift:    Labs: No results for input(s): WBC, HGB, PLT, LABCREA, CREATININE in the last 72 hours. CrCl cannot be calculated (Patient has no serum creatinine result on file.). No results for input(s): VANCOTROUGH, VANCOPEAK, VANCORANDOM, GENTTROUGH, GENTPEAK, GENTRANDOM, TOBRATROUGH, TOBRAPEAK, TOBRARND, AMIKACINPEAK, AMIKACINTROU, AMIKACIN in the last 72 hours.   Microbiology: No results found for this or any previous visit (from the past 720 hour(s)).  Medical History: Past Medical History  Diagnosis Date  . Hypertension   . GERD (gastroesophageal reflux disease)   . Hypercholesteremia   . Thyroid activity decreased   . Multiple sclerosis (HCC)   . Murmur, heart     Assessment: 69 yom s/p fall, increased bilateral leg pain to [facility] ED. Pharmacy consulted to dose vanc/zosyn for cellulitis. Tmax/24h 101.3, SCr 0.92 on admit, normalized CrCl~71.  1/11 vanc>> 1/11 zosyn>>  1/11 UC>>  Goal of Therapy:  Vancomycin trough level 10-15 mcg/ml  Plan:  Vanc 2g IV x 1; then Vanc 750mg  IV q12h Zosyn 3.375g IV ( inf) x1; then Zosyn 3.375g IV q8h Monitor clinical progress, c/s, renal function, abx plan/LOT VT@SS  as indicated  Babs Bertin, PharmD, [facility] Clinical Pharmacist Pager [phone] 04/13/2015 3:23 PM

## 2015-04-13 NOTE — Plan of Care (Signed)
Transfer from [facility], Dr. Corlis Leak  70 year old male with a history of hypertension GERD, multiple sclerosis, chronic nonhealing lower tissue and the wounds, presented to the emergency department for weakness. Patient was found to have sepsis secondary to right lower extremity cellulitis extending to the groin, as reported by EDP. Patient does have a wound care nurse that sees him approximately 3 times per week. Patient currently on vancomycin and Zosyn. Vital signs are stable. Accepted to medical floor.  Time spent: 5 minutes  Taiesha Bovard D.O. [facility] Pager [phone]  If 7PM-7AM, please contact night-coverage [email or web address] Password [email or web address] 04/13/2015, 5:03 PM

## 2015-04-13 NOTE — ED Notes (Signed)
EMS reports the patient fell this morning before 0700 today.  States ems came to his home and helped him to get back into bed.  States patient has a history of MS, and has leg weakness, and since his fall today, his bilateral leg pain and weakness has increased.  VS:  144/70; hr 84 and regular, rr 18.

## 2015-04-13 NOTE — ED Notes (Signed)
Pt transported to [facility] by [facility]

## 2015-04-13 NOTE — ED Notes (Signed)
MD at bedside. In to see left lower leg after drsg was removed

## 2015-04-13 NOTE — ED Notes (Signed)
MD at bedside. 

## 2015-04-13 NOTE — ED Notes (Signed)
Patient reports having problems walking this morning and fell in the bathroom while trying to get up from the toilet.  States he felt fine until he fell and now has pain and weakness in bilateral legs.

## 2015-04-13 NOTE — ED Provider Notes (Signed)
CSN: [id number]     Arrival date & time 04/13/15  1445 History   First MD Initiated Contact with Patient 04/13/15 1457     Chief Complaint  Patient presents with  . Fall     (Consider location/radiation/quality/duration/timing/severity/associated sxs/prior Treatment) HPI  Patient is a 70 year old male presenting with history of hypertension GERD, multiple sclerosis and permanent lower extremity weakness with bilateral extremity chronic nonhealing wounds. Patient's presenting today with weakness. On arrival here he has fever to 101.4. Patient states he is unable to get off the toilet and called EMS. He reports global weakness for the last 2-3 days. He has a visiting nurse that comes every 3 days to redress his wounds. He said he noticed no difference in them. He has not noticed fever at home. He does report mild nausea.  No other focal symptoms such as vomiting, diarrhea.   Past Medical History  Diagnosis Date  . Hypertension   . GERD (gastroesophageal reflux disease)   . Hypercholesteremia   . Thyroid activity decreased   . Multiple sclerosis (HCC)   . Murmur, heart    Past Surgical History  Procedure Laterality Date  . Appendectomy     Family History  Problem Relation Age of Onset  . CAD Neg Hx    Social History  Substance Use Topics  . Smoking status: Never Smoker   . Smokeless tobacco: None  . Alcohol Use: No    Review of Systems  Constitutional: Positive for fever and fatigue. Negative for activity change.  Eyes: Negative for discharge.  Respiratory: Negative for cough and shortness of breath.   Cardiovascular: Negative for chest pain.  Gastrointestinal: Negative for abdominal pain.  Genitourinary: Negative for dysuria and urgency.  Musculoskeletal: Negative for arthralgias.  Skin: Positive for color change.  Allergic/Immunologic: Negative for immunocompromised state.  Neurological: Positive for weakness. Negative for seizures.  Psychiatric/Behavioral: Negative  for agitation.  All other systems reviewed and are negative.     Allergies  Review of patient's allergies indicates no known allergies.  Home Medications   Prior to Admission medications   Medication Sig Start Date End Date Taking? Authorizing Provider  acetaminophen (TYLENOL) 325 MG tablet Take 325 mg by mouth every 6 (six) hours as needed for mild pain or moderate pain.   Yes Historical Provider, MD  amiodarone (PACERONE) 200 MG tablet Take 200 mg by mouth daily.   Yes Historical Provider, MD  aspirin 81 MG tablet Take 81 mg by mouth daily.   Yes Historical Provider, MD  atorvastatin (LIPITOR) 80 MG tablet Take 80 mg by mouth daily.   Yes Historical Provider, MD  Cholecalciferol (VITAMIN D3) 2000 UNITS TABS Take 1 tablet by mouth daily.   Yes Historical Provider, MD  Dimethyl Fumarate (TECFIDERA) 240 MG CPDR Take 240 mg by mouth 2 (two) times daily.    Yes Historical Provider, MD  gabapentin (NEURONTIN) 300 MG capsule Take 300 mg by mouth 3 (three) times daily.   Yes Historical Provider, MD  levothyroxine (SYNTHROID, LEVOTHROID) 125 MCG tablet Take 125 mcg by mouth daily before breakfast.   Yes Historical Provider, MD  metoprolol (LOPRESSOR) 50 MG tablet Take 50 mg by mouth 2 (two) times daily.   Yes Historical Provider, MD  feeding supplement (BOOST / RESOURCE BREEZE) LIQD Take 1 Container by mouth daily at 3 pm. 10/19/14   Evlyn Kanner Love, PA-C  feeding supplement, ENSURE ENLIVE, (ENSURE ENLIVE) LIQD Take 237 mLs by mouth daily. 10/19/14   Jacquelynn Cree, PA-C  HYDROcodone-acetaminophen (NORCO/VICODIN) 5-325 MG per tablet Take 1 tablet by mouth every 6 (six) hours as needed. 11/08/14   Richardean Canal, MD   BP 146/77 mmHg  Pulse 92  Temp(Src) 101.3 F (38.5 C) (Oral)  Resp 18  Ht 6\' 3"  (1.905 m)  Wt 242 lb (109.77 kg)  BMI 30.25 kg/m2  SpO2 100% Physical Exam  Constitutional: He is oriented to person, place, and time. He appears well-nourished.  HENT:  Head: Normocephalic.   Mouth/Throat: Oropharynx is clear and moist.  Eyes: Conjunctivae are normal.  Neck: No tracheal deviation present.  Cardiovascular: Normal rate.   Murmur heard. Pulmonary/Chest: Effort normal. No stridor. No respiratory distress.  Abdominal: Soft. There is no tenderness. There is no guarding.  Musculoskeletal: Normal range of motion. He exhibits no edema.  Neurological: He is oriented to person, place, and time. No cranial nerve deficit.  Chronic weakness in bilateral lower extremities noticed.  Skin: Skin is warm and dry. No rash noted. He is not diaphoretic.  Bilateral lower extremities with chronic venous changes, soaking through current dressings.  Right lower extremity with erythema rising above his knee into the upper thigh. It does not cross into the perineal area. No focal fluctuance. Very warm to the touch.  Small bloody blister on right fourth toe.  L leg is oozing with yellow/green fluid, increased erythema and warmth.  Psychiatric: He has a normal mood and affect. His behavior is normal.  Nursing note and vitals reviewed.   ED Course  Procedures (including critical care time) Labs Review Labs Reviewed  URINE CULTURE  CBC WITH DIFFERENTIAL/PLATELET  COMPREHENSIVE METABOLIC PANEL  PROTIME-INR  URINALYSIS, ROUTINE W REFLEX MICROSCOPIC (NOT AT [facility])  I-STAT CG4 LACTIC ACID, ED    Imaging Review No results found. I have personally reviewed and evaluated these images and lab results as part of my medical decision-making.   EKG Interpretation None      MDM   Final diagnoses:  None    Patient is a 70 year old male with multiple sclerosis and chronic weaknesss and chronic nonhealing wounds in bilateral lower extremities presenting today with fever and weakness and spreading erythema in his right lower extremity. Patient states his visiting nurse came two days ago and did not notice anything out of the ordinary. Today on exam he's got spreading warmth up to his  right thigh. This is consistent with cellulitis. It is not spread to the perineal area yet. I have traced the area near the perineum with a skin marker.  We will get lactic, give fluids, labs, broad-spectrum antibiotics. Patient will require inpatient admission given his fever weakness and infection.   4:44 PM Lactate neagtive. WIll call for admission for med surg bed for IV abx.   Breeonna Mone Randall An, MD 04/13/15 1644

## 2015-04-13 NOTE — ED Notes (Signed)
Attempt to call report nurse not available 

## 2015-04-13 NOTE — H&P (Signed)
[facility] History and Physical  Martin Powell VHQ:[id number] DOB: 12-May-1945 DOA: 04/13/2015  Referring physician: Patient was transferred from [facility] [place]. PCP: No PCP Per Patient [facility]. Specialists: None.  Chief Complaint: Weakness and fever and chills.  HPI: Martin Powell is a 70 y.o. male with history of multiple sclerosis, hypertension, atrial fibrillation, hypothyroidism, chronic lower extremity wound presented to the ER at [facility] [place] because of weakness. Patient also states he had fever and chills. Patient's symptoms started today morning after waking up. Patient was found to be febrile and tachycardic in the ER. Patient's lower extremity wounds look erythematous with some discharge. Patient also has swelling of his scrotum and penis. Patient states his penis swelling is chronic. But did complain of some difficulty urinating. Denies any chest pain or shortness of breath. Patient states that he has been having increasing weakness of his lower extremities since this morning. Patient on exam also has a right-sided ptosis. Patient has been admitted for sepsis possibly from cellulitis of the lower extremity with increasing weakness probably from multiple sclerosis.   Review of Systems: As presented in the history of presenting illness, rest negative.  Past Medical History  Diagnosis Date  . Hypertension   . GERD (gastroesophageal reflux disease)   . Hypercholesteremia   . Thyroid activity decreased   . Multiple sclerosis (HCC)   . Murmur, heart    Past Surgical History  Procedure Laterality Date  . Appendectomy     Social History:  reports that he has never smoked. He does not have any smokeless tobacco history on file. He reports that he does not drink alcohol or use illicit drugs. Where does patient live home. Can patient participate in ADLs? Yes.  No Known Allergies  Family History:  Family History  Problem Relation Age of Onset  . CAD Neg  Hx       Prior to Admission medications   Medication Sig Start Date End Date Taking? Authorizing Provider  acetaminophen (TYLENOL) 325 MG tablet Take 325 mg by mouth every 6 (six) hours as needed for mild pain or moderate pain.   Yes Historical Provider, MD  amiodarone (PACERONE) 200 MG tablet Take 200 mg by mouth daily.   Yes Historical Provider, MD  aspirin 81 MG tablet Take 81 mg by mouth daily.   Yes Historical Provider, MD  atorvastatin (LIPITOR) 80 MG tablet Take 80 mg by mouth daily.   Yes Historical Provider, MD  Cholecalciferol (VITAMIN D3) 2000 UNITS TABS Take 1 tablet by mouth daily.   Yes Historical Provider, MD  Dimethyl Fumarate (TECFIDERA) 240 MG CPDR Take 240 mg by mouth 2 (two) times daily.    Yes Historical Provider, MD  gabapentin (NEURONTIN) 300 MG capsule Take 300 mg by mouth 3 (three) times daily.   Yes Historical Provider, MD  levothyroxine (SYNTHROID, LEVOTHROID) 125 MCG tablet Take 125 mcg by mouth daily before breakfast.   Yes Historical Provider, MD  metoprolol (LOPRESSOR) 50 MG tablet Take 50 mg by mouth 2 (two) times daily.   Yes Historical Provider, MD  feeding supplement (BOOST / RESOURCE BREEZE) LIQD Take 1 Container by mouth daily at 3 pm. 10/19/14   Evlyn Kanner Love, PA-C  feeding supplement, ENSURE ENLIVE, (ENSURE ENLIVE) LIQD Take 237 mLs by mouth daily. 10/19/14   Jacquelynn Cree, PA-C  HYDROcodone-acetaminophen (NORCO/VICODIN) 5-325 MG per tablet Take 1 tablet by mouth every 6 (six) hours as needed. 11/08/14   Richardean Canal, MD  Physical Exam: Filed Vitals:   04/13/15 1900 04/13/15 1930 04/13/15 2053 04/13/15 2100  BP: 164/74 158/76  138/59  Pulse:   101 98  Temp:    103.1 F (39.5 C)  TempSrc:    Oral  Resp:    18  Height:      Weight:      SpO2:   96% 94%     General:  Moderately built and nourished.  Eyes: Anicteric no pallor. Right-sided ptosis.  ENT: No discharge from the ears eyes nose or mouth.  Neck: No mass felt.  Cardiovascular: S1-S2  heard.  Respiratory: No rhonchi or crepitations.  Abdomen: Soft nontender bowel sounds present.  Skin: Erythema involving the both lower extremities more on the left side. Erythema extends up to the knee on both sides from the foot. There is also multiple wounds on the lower extremity. Also mild erythema of the scrotum.  Musculoskeletal: Bilateral lower extremity edema.  Psychiatric: Appears normal.  Neurologic: Alert awake oriented to time place and person. Has weakness of the both lower extremities about 3 x 5 in strength. Patient has right-sided ptosis.  Labs on Admission:  Basic Metabolic Panel:  Recent Labs Lab 04/13/15 1530  NA 138  K 4.0  CL 105  CO2 26  GLUCOSE 118*  BUN 15  CREATININE 0.92  CALCIUM 8.8*   Liver Function Tests:  Recent Labs Lab 04/13/15 1530  AST 22  ALT 13*  ALKPHOS 85  BILITOT 1.1  PROT 7.2  ALBUMIN 3.4*   No results for input(s): LIPASE, AMYLASE in the last 168 hours. No results for input(s): AMMONIA in the last 168 hours. CBC:  Recent Labs Lab 04/13/15 1530  WBC 19.1*  NEUTROABS 16.9*  HGB 11.6*  HCT 36.9*  MCV 89.6  PLT 233   Cardiac Enzymes: No results for input(s): CKTOTAL, CKMB, CKMBINDEX, TROPONINI in the last 168 hours.  BNP (last 3 results) No results for input(s): BNP in the last 8760 hours.  ProBNP (last 3 results) No results for input(s): PROBNP in the last 8760 hours.  CBG: No results for input(s): GLUCAP in the last 168 hours.  Radiological Exams on Admission: Dg Chest 2 View  04/13/2015  CLINICAL DATA:  Fall today with bilateral leg pain and weakness EXAM: CHEST - 2 VIEW COMPARISON:  02/11/2015 FINDINGS: Cardiac shadow remains enlarged. Patient is rotated to the right accentuating the mediastinal markings. No focal infiltrate or sizable effusion is seen. No acute bony abnormality is noted. IMPRESSION: No active disease. Electronically Signed   By: Alcide Clever M.D.   On: 04/13/2015 16:08    EKG:  Independently reviewed. Normal sinus rhythm with APCs.  Assessment/Plan Principal Problem:   Sepsis (HCC) Active Problems:   Hypothyroidism   Multiple sclerosis (HCC)   Cellulitis LOWER EXTREMITIES.   1. Sepsis from bilateral lower extremity cellulitis - that is also some scrotal swelling with penile swelling. There is no tenderness on exam at the penile area and scrotal area. At this time patient has been placed on vancomycin and Zosyn for sepsis from cellulitis. Follow blood cultures. In addition I have ordered a CAT scan of his abdomen and pelvis to make sure that is no involvement of his pelvic area specifically the scrotal area and also CAT scan of his tibia and fibula to make sure there is no bony involvement. Continue hydration. Wound team consult. 2. Multiple sclerosis with increasing weakness of the lower extremity with right-sided ptosis - I did discuss with on-call neurologist  Dr. Hosie Poisson over this time is advised MRI brain. Dr. Hosie Poisson feels that patient's weakness may be from his fever exacerbating his multiple sclerosis. Further recommendations for her MRI findings. 3. History of A. fib not on anticoagulation not sure for the reason - presently in sinus rhythm. Continue amiodarone and metoprolol. Will need to contact patient's primary care physician for any contraindications for anticoagulation. 4. Hypothyroidism on Synthroid. 5. Hyperlipidemia on statins. 6. Penile and scrotal edema - I did discuss with Dr. McDiarmid, on-call urologist. At this time urologist is advised to continue with antibiotics and see if there is any improvement and if not we'll reconsult urology. Patient is not having any urinary retention.   DVT Prophylaxis Lovenox.  Code Status: Full code.  Family Communication: Discussed with patient.  Disposition Plan: Admit to inpatient.    Caleigha Zale N. [facility] Pager [phone].  If 7PM-7AM, please contact night-coverage [email or web address] Password  [facility] 04/13/2015, 11:22 PM

## 2015-04-13 NOTE — ED Notes (Signed)
Phoned sister Annice Pih informed on pt moved to [facility]

## 2015-04-13 NOTE — Progress Notes (Signed)
ANTIBIOTIC CONSULT NOTE - INITIAL  Pharmacy Consult for vanc/zosyn Indication: cellulitis  No Known Allergies  Patient Measurements: Height: 6\' 3"  (190.5 cm) Weight: 242 lb (109.77 kg) IBW/kg (Calculated) : 84.5 Adjusted Body Weight:   Vital Signs: Temp: 103.1 F (39.5 C) (01/11 2100) Temp Source: Oral (01/11 2100) BP: 138/59 mmHg (01/11 2100) Pulse Rate: 98 (01/11 2100) Intake/Output from previous day:   Intake/Output from this shift:    Labs:  Recent Labs  04/13/15 1530  WBC 19.1*  HGB 11.6*  PLT 233  CREATININE 0.92   Estimated Creatinine Clearance: 101.4 mL/min (by C-G formula based on Cr of 0.92). No results for input(s): VANCOTROUGH, VANCOPEAK, VANCORANDOM, GENTTROUGH, GENTPEAK, GENTRANDOM, TOBRATROUGH, TOBRAPEAK, TOBRARND, AMIKACINPEAK, AMIKACINTROU, AMIKACIN in the last 72 hours.   Microbiology: No results found for this or any previous visit (from the past 720 hour(s)).  Medical History: Past Medical History  Diagnosis Date  . Hypertension   . GERD (gastroesophageal reflux disease)   . Hypercholesteremia   . Thyroid activity decreased   . Multiple sclerosis (HCC)   . Murmur, heart     Assessment: 69 yom s/p fall, increased bilateral leg pain to [facility] ED. Pharmacy consulted to dose vanc/zosyn for cellulitis. Tmax/24h 101.3, SCr 0.92 on admit, normalized CrCl~71.  1/11 vanc>> 1/11 zosyn>>  1/11 UC>>  Goal of Therapy:  Vancomycin trough level 10-15 mcg/ml  Plan:  Vanc 2g IV x 1; then Vanc 750mg  IV q12h Zosyn 3.375g IV ( inf) x1; then Zosyn 3.375g IV q8h Monitor clinical progress, c/s, renal function, abx plan/LOT VT@SS  as indicated  Babs Bertin, PharmD, [facility] Clinical Pharmacist Pager [phone] 04/13/2015 11:38 PM   Addendum: Pt missed second half of loading dose so only received 1gm in ED.   Plan: Change Vancomycin to 1250mg  IV q12h starting now.  Christoper Fabian, PharmD, BCPS Clinical pharmacist, pager [phone] 04/13/2015 11:40  PM

## 2015-04-14 ENCOUNTER — Inpatient Hospital Stay (HOSPITAL_COMMUNITY): Payer: Medicare Other

## 2015-04-14 DIAGNOSIS — I480 Paroxysmal atrial fibrillation: Secondary | ICD-10-CM

## 2015-04-14 DIAGNOSIS — G35 Multiple sclerosis: Secondary | ICD-10-CM

## 2015-04-14 DIAGNOSIS — A419 Sepsis, unspecified organism: Principal | ICD-10-CM

## 2015-04-14 DIAGNOSIS — R609 Edema, unspecified: Secondary | ICD-10-CM

## 2015-04-14 DIAGNOSIS — L03119 Cellulitis of unspecified part of limb: Secondary | ICD-10-CM

## 2015-04-14 DIAGNOSIS — E038 Other specified hypothyroidism: Secondary | ICD-10-CM

## 2015-04-14 LAB — CBC WITH DIFFERENTIAL/PLATELET
BASOS PCT: 0 %
Basophils Absolute: 0 10*3/uL (ref 0.0–0.1)
EOS ABS: 0 10*3/uL (ref 0.0–0.7)
EOS PCT: 0 %
HCT: 35.3 % — ABNORMAL LOW (ref 39.0–52.0)
HEMOGLOBIN: 11.2 g/dL — AB (ref 13.0–17.0)
Lymphocytes Relative: 5 %
Lymphs Abs: 0.7 10*3/uL (ref 0.7–4.0)
MCH: 28.2 pg (ref 26.0–34.0)
MCHC: 31.7 g/dL (ref 30.0–36.0)
MCV: 88.9 fL (ref 78.0–100.0)
MONO ABS: 0.8 10*3/uL (ref 0.1–1.0)
MONOS PCT: 5 %
NEUTROS PCT: 90 %
Neutro Abs: 13.9 10*3/uL — ABNORMAL HIGH (ref 1.7–7.7)
PLATELETS: 224 10*3/uL (ref 150–400)
RBC: 3.97 MIL/uL — ABNORMAL LOW (ref 4.22–5.81)
RDW: 14.3 % (ref 11.5–15.5)
WBC: 15.5 10*3/uL — ABNORMAL HIGH (ref 4.0–10.5)

## 2015-04-14 LAB — CBC
HEMATOCRIT: 34.9 % — AB (ref 39.0–52.0)
Hemoglobin: 11.2 g/dL — ABNORMAL LOW (ref 13.0–17.0)
MCH: 28.9 pg (ref 26.0–34.0)
MCHC: 32.1 g/dL (ref 30.0–36.0)
MCV: 90.2 fL (ref 78.0–100.0)
PLATELETS: 187 10*3/uL (ref 150–400)
RBC: 3.87 MIL/uL — AB (ref 4.22–5.81)
RDW: 14.7 % (ref 11.5–15.5)
WBC: 12.7 10*3/uL — AB (ref 4.0–10.5)

## 2015-04-14 LAB — COMPREHENSIVE METABOLIC PANEL
ALBUMIN: 3 g/dL — AB (ref 3.5–5.0)
ALT: 12 U/L — ABNORMAL LOW (ref 17–63)
ANION GAP: 8 (ref 5–15)
AST: 22 U/L (ref 15–41)
Alkaline Phosphatase: 74 U/L (ref 38–126)
BUN: 11 mg/dL (ref 6–20)
CHLORIDE: 105 mmol/L (ref 101–111)
CO2: 24 mmol/L (ref 22–32)
Calcium: 8.4 mg/dL — ABNORMAL LOW (ref 8.9–10.3)
Creatinine, Ser: 1.03 mg/dL (ref 0.61–1.24)
GFR calc non Af Amer: >60 mL/min (ref >60)
GLUCOSE: 118 mg/dL — AB (ref 65–99)
POTASSIUM: 3.5 mmol/L (ref 3.5–5.1)
SODIUM: 137 mmol/L (ref 135–145)
Total Bilirubin: 1.5 mg/dL — ABNORMAL HIGH (ref 0.3–1.2)
Total Protein: 6.2 g/dL — ABNORMAL LOW (ref 6.5–8.1)

## 2015-04-14 LAB — LIPID PANEL
CHOL/HDL RATIO: 2.2 ratio
Cholesterol: 80 mg/dL (ref 0–200)
HDL: 37 mg/dL — AB (ref >40)
LDL CALC: 33 mg/dL (ref 0–99)
Triglycerides: 48 mg/dL (ref <150)
VLDL: 10 mg/dL (ref 0–40)

## 2015-04-14 LAB — CREATININE, SERUM: Creatinine, Ser: 1.1 mg/dL (ref 0.61–1.24)

## 2015-04-14 LAB — LACTIC ACID, PLASMA: Lactic Acid, Venous: 1.1 mmol/L (ref 0.5–2.0)

## 2015-04-14 LAB — PROCALCITONIN: Procalcitonin: 0.14 ng/mL

## 2015-04-14 LAB — APTT: APTT: 38 s — AB (ref 24–37)

## 2015-04-14 MED ORDER — STROKE: EARLY STAGES OF RECOVERY BOOK
Freq: Once | Status: AC
  Administered 2015-04-14: 08:00:00
  Filled 2015-04-14: qty 1

## 2015-04-14 MED ORDER — ENOXAPARIN SODIUM 40 MG/0.4ML ~~LOC~~ SOLN: 40.0000 mg | SUBCUTANEOUS | Status: DC

## 2015-04-14 MED ORDER — ASPIRIN 300 MG RE SUPP: 300.0000 mg | Freq: Every day | RECTAL | Status: DC

## 2015-04-14 MED ORDER — LORAZEPAM 2 MG/ML IJ SOLN: 0.5000 mg | Freq: Once | INTRAMUSCULAR | Status: DC

## 2015-04-14 MED ORDER — RIVAROXABAN 15 MG PO TABS: 15.0000 mg | ORAL_TABLET | Freq: Every day | ORAL | Status: DC

## 2015-04-14 MED ORDER — ADULT MULTIVITAMIN W/MINERALS CH
1.0000 | ORAL_TABLET | Freq: Every day | ORAL | Status: DC
  Administered 2015-04-14 – 2015-04-18 (×5): 1 via ORAL
  Filled 2015-04-14 (×5): qty 1

## 2015-04-14 MED ORDER — RIVAROXABAN 20 MG PO TABS
20.0000 mg | ORAL_TABLET | Freq: Every day | ORAL | Status: DC
  Administered 2015-04-14 – 2015-04-17 (×4): 20 mg via ORAL
  Filled 2015-04-14 (×4): qty 1

## 2015-04-14 MED ORDER — ASPIRIN 325 MG PO TABS
325.0000 mg | ORAL_TABLET | Freq: Every day | ORAL | Status: DC
  Administered 2015-04-14: 325 mg via ORAL
  Filled 2015-04-14: qty 1

## 2015-04-14 MED ORDER — IOHEXOL 300 MG/ML  SOLN
100.0000 mL | Freq: Once | INTRAMUSCULAR | Status: AC | PRN
  Administered 2015-04-14: 100 mL via INTRAVENOUS

## 2015-04-14 NOTE — Progress Notes (Addendum)
Initial Nutrition Assessment  DOCUMENTATION CODES:   Obesity unspecified  INTERVENTION:   -MVI daily  NUTRITION DIAGNOSIS:   Increased nutrient needs related to wound healing as evidenced by estimated needs.  GOAL:   Patient will meet greater than or equal to 90% of their needs  MONITOR:   PO intake, Supplement acceptance, Labs, Weight trends, Skin, I & O's  REASON FOR ASSESSMENT:   Malnutrition Screening Tool    ASSESSMENT:   Martin Powell is a 70 y.o. male with history of multiple sclerosis, hypertension, atrial fibrillation, hypothyroidism, chronic lower extremity wound presented to the ER at [facility] [place] because of weakness. Patient also states he had fever and chills. Patient's symptoms started today morning after waking up. Patient was found to be febrile and tachycardic in the ER. Patient's lower extremity wounds look erythematous with some discharge. Patient also has swelling of his scrotum and penis. Patient states his penis swelling is chronic. But did complain of some difficulty urinating. Denies any chest pain or shortness of breath. Patient states that he has been having increasing weakness of his lower extremities since this morning. Patient on exam also has a right-sided ptosis. Patient has been admitted for sepsis possibly from cellulitis of the lower extremity with increasing weakness probably from multiple sclerosis.   Pt headed out of room for procedure at time of visit.   Pt admitted with sepsis from bilateral extremity cellulitis.   Reviewed wt hx; noted wt stability for > 1 year.   Appetite is good; noted 75-80% meal completion this admission.   Reviewed [facility] note from 04/14/15; pt with no open wounds, pt bilateral cellulitis with venous stasis on bilateral extremities.   Unable to complete Nutrition-Focused physical exam at this time.   Labs reviewed.   Diet Order:  Diet Heart Room service appropriate?: Yes; Fluid consistency:: Thin  Skin:   Wound (see comment) (BLE cellulits and venous stasis)  Last BM:  04/11/15  Height:   Ht Readings from Last 1 Encounters:  04/13/15 6\' 3"  (1.905 m)    Weight:   Wt Readings from Last 1 Encounters:  04/13/15 242 lb (109.77 kg)    Ideal Body Weight:  89.1 kg  BMI:  Body mass index is 30.25 kg/(m^2).  Estimated Nutritional Needs:   Kcal:  2000-2200  Protein:  105-120 grams  Fluid:  2.0-2.2 L  EDUCATION NEEDS:   No education needs identified at this time  Ariannie Penaloza A. Mayford Knife, RD, LDN, CDE Pager: [phone] After hours Pager: [phone]

## 2015-04-14 NOTE — Consult Note (Signed)
WOC wound consult note Reason for Consult: LE cellulitis  Today at the time of my assessment patient is very sleepy.  He does state that he wears compression stockings at home. There is no family in the room to verify this information.  He does have bilateral LE edema 1-2+, no active weeping noted at the time of my assessment and no open wounds. He has one darkened ulcer on right 4th toe, not open, not draining.  He has bilateral palpable pulses.  Wound type: cellulitis with venous stasis, chronic venous skin changes.  Pressure Ulcer POA: No Drainage (amount, consistency, odor) none at the time of my assessment today Periwound: intact  Dressing procedure/placement/frequency: No topical care needed to the LE currently, no active open wounds. Will add non adherent PRN if needed if any areas open and/or are weeping.   Discussed POC with patient and bedside nurse.  Re consult if needed, will not follow at this time. Thanks  Devin Foskey [employer], CWOCN [phone])

## 2015-04-14 NOTE — Progress Notes (Signed)
Attempted to insert foley  Per MD's instruction but unsuccessful.

## 2015-04-14 NOTE — Progress Notes (Signed)
Utilization review completed. Teddy Pena, RN, BSN. 

## 2015-04-14 NOTE — Progress Notes (Signed)
VASCULAR LAB PRELIMINARY  PRELIMINARY  PRELIMINARY  PRELIMINARY  Bilateral lower extremity venous duplex completed.    Preliminary report:  There is no acute DVT or SVT noted in the bilateral lower extremities.  Interstitial fluid noted in the bilateral calves.  Martin Powell, RVT 04/14/2015, 9:57 AM

## 2015-04-14 NOTE — Progress Notes (Addendum)
PATIENT DETAILS Name: Martin Powell Age: 70 y.o. Sex: male Date of Birth: 25-Aug-1945 Admit Date: 04/13/2015 Admitting Physician Edsel Petrin, DO PCP:No PCP Per Patient  Subjective: Awake and alert-per patient lower extremities essentially unchanged.  Assessment/Plan: Principal Problem: Sepsis secondary to lower extremity cellulitis: Secondary to left more than right lower extremity cellulitis. Fever curve seems to be improving, leukocytosis downtrending-Continue empiric antibiotics, await blood cultures.  Active Problems: Worsening lower extremity weakness: Suspect secondary to sepsis. However MRI brain shows either a small CVA or a demyelinating changes. Neurology consulted, recommendations are for MRI of the cervical and thoracic spine, physical therapy and to continue with physical therapy services. Stroke workup is not recommended-as not felt to be consistent with a CVA at this time.  History of multiple sclerosis: See above-neurology following. If weakness does not improve with treatment of underlying infection/PT evaluation-may need to consider steroids.  History of atrial fibrillation: Continue amiodarone and metoprolol.? Not on anticoagulation-it reviewed records from care everywhere-was discharged from [facility] in December-apparently was on Xarelto. Will try and get records from PCPs office, more information from family  Addendum: Spoke with patient-is taking xarelto at home-and subsequently reconfirmed by pharmacy. Will restart Xarelto-stop ASA.   Hypothyroidism: Continue Synthroid  Dyslipidemia: Continue statins  Scrotal edema/penile edema: Chronic issue per patient-has this for years. Per patient-he does not have any issues with urination. Follow.  Disposition: Remain inpatient  Antimicrobial agents  See below  Anti-infectives    Start     Dose/Rate Route Frequency Ordered Stop   04/14/15 0430  vancomycin (VANCOCIN) IVPB 750  mg/150 ml premix  Status:  Discontinued     750 mg 150 mL/hr over 60 Minutes Intravenous Every 12 hours 04/13/15 1623 04/13/15 2332   04/14/15 0030  piperacillin-tazobactam (ZOSYN) IVPB 3.375 g     3.375 g 12.5 mL/hr over 240 Minutes Intravenous 3 times per day 04/13/15 1623     04/14/15 0000  vancomycin (VANCOCIN) IVPB 750 mg/150 ml premix  Status:  Discontinued     750 mg 150 mL/hr over 60 Minutes Intravenous Every 12 hours 04/13/15 2332 04/13/15 2339   04/14/15 0000  vancomycin (VANCOCIN) 1,250 mg in sodium chloride 0.9 % 250 mL IVPB     1,250 mg 166.7 mL/hr over 90 Minutes Intravenous Every 12 hours 04/13/15 2339     04/13/15 2345  vancomycin (VANCOCIN) IVPB 1000 mg/200 mL premix  Status:  Discontinued     1,000 mg 200 mL/hr over 60 Minutes Intravenous STAT 04/13/15 2322 04/13/15 2331   04/13/15 2330  piperacillin-tazobactam (ZOSYN) IVPB 3.375 g  Status:  Discontinued     3.375 g 100 mL/hr over 30 Minutes Intravenous  Once 04/13/15 2322 04/13/15 2330   04/13/15 1530  piperacillin-tazobactam (ZOSYN) IVPB 3.375 g     3.375 g 100 mL/hr over 30 Minutes Intravenous  Once 04/13/15 1528 04/13/15 1614   04/13/15 1530  vancomycin (VANCOCIN) IVPB 1000 mg/200 mL premix     1,000 mg 200 mL/hr over 60 Minutes Intravenous  Once 04/13/15 1528 04/13/15 1719   04/13/15 1530  vancomycin (VANCOCIN) IVPB 1000 mg/200 mL premix  Status:  Discontinued     1,000 mg 200 mL/hr over 60 Minutes Intravenous  Once 04/13/15 1528 04/13/15 2330      DVT Prophylaxis: Prophylactic Lovenox   Code Status: Full code   Family Communication None at bedside  Procedures: None  CONSULTS:  neurology  Time spent 35 minutes-Greater than 50% of this time was spent in counseling, explanation of diagnosis, planning of further management, and coordination of care.  MEDICATIONS: Scheduled Meds: . amiodarone  200 mg Oral Daily  . aspirin  300 mg Rectal Daily   Or  . aspirin  325 mg Oral Daily  . atorvastatin   80 mg Oral q1800  . Dimethyl Fumarate  240 mg Oral BID  . enoxaparin (LOVENOX) injection  40 mg Subcutaneous QHS  . gabapentin  300 mg Oral TID  . levothyroxine  125 mcg Oral QAC breakfast  . metoprolol  50 mg Oral BID  . piperacillin-tazobactam (ZOSYN)  IV  3.375 g Intravenous 3 times per day  . vancomycin  1,250 mg Intravenous Q12H   Continuous Infusions: . sodium chloride 125 mL/hr at 04/14/15 0109   PRN Meds:.acetaminophen **OR** acetaminophen, ondansetron **OR** ondansetron (ZOFRAN) IV    PHYSICAL EXAM: Vital signs in last 24 hours: Filed Vitals:   04/14/15 0300 04/14/15 0400 04/14/15 0624 04/14/15 0903  BP: 110/45 104/58 128/52 111/51  Pulse: 83 94 74 77  Temp:  98.4 F (36.9 C) 100.3 F (37.9 C) 99.5 F (37.5 C)  TempSrc:  Oral Oral Oral  Resp: 18 16 22 18   Height:      Weight:      SpO2: 95% 97% 94% 96%    Weight change:  Filed Weights   04/13/15 1446  Weight: 109.77 kg (242 lb)   Body mass index is 30.25 kg/(m^2).   Gen Exam: Awake and alert with clear speech.   Neck: Supple, No JVD.   Chest: B/L Clear.   CVS: S1 S2 Regular, no murmurs.  Abdomen: soft, BS +, non tender, non distended.  Extremities: see pictures below Neurologic: Non Focal.   Skin: No Rash.   Wounds: N/A.            Intake/Output from previous day:  Intake/Output Summary (Last 24 hours) at 04/14/15 1149 Last data filed at 04/14/15 1100  Gross per 24 hour  Intake 1281.25 ml  Output      0 ml  Net 1281.25 ml     LAB RESULTS: CBC  Recent Labs Lab 04/13/15 1530 04/14/15 0210 04/14/15 0820  WBC 19.1* 15.5* 12.7*  HGB 11.6* 11.2* 11.2*  HCT 36.9* 35.3* 34.9*  PLT 233 224 187  MCV 89.6 88.9 90.2  MCH 28.2 28.2 28.9  MCHC 31.4 31.7 32.1  RDW 14.2 14.3 14.7  LYMPHSABS 0.8 0.7  --   MONOABS 1.3* 0.8  --   EOSABS 0.1 0.0  --   BASOSABS 0.1 0.0  --     Chemistries   Recent Labs Lab 04/13/15 1530 04/14/15 0210 04/14/15 0820  NA 138 137  --   K 4.0 3.5  --     CL 105 105  --   CO2 26 24  --   GLUCOSE 118* 118*  --   BUN 15 11  --   CREATININE 0.92 1.03 1.10  CALCIUM 8.8* 8.4*  --     CBG: No results for input(s): GLUCAP in the last 168 hours.  GFR Estimated Creatinine Clearance: 84.8 mL/min (by C-G formula based on Cr of 1.1).  Coagulation profile  Recent Labs Lab 04/13/15 1530  INR 1.75*    Cardiac Enzymes No results for input(s): CKMB, TROPONINI, MYOGLOBIN in the last 168 hours.  Invalid input(s): CK  Invalid input(s): POCBNP No results for input(s): DDIMER in the last 72 hours. No results for  input(s): HGBA1C in the last 72 hours.  Recent Labs  04/14/15 0820  CHOL 80  HDL 37*  LDLCALC 33  TRIG 48  CHOLHDL 2.2   No results for input(s): TSH, T4TOTAL, T3FREE, THYROIDAB in the last 72 hours.  Invalid input(s): FREET3 No results for input(s): VITAMINB12, FOLATE, FERRITIN, TIBC, IRON, RETICCTPCT in the last 72 hours. No results for input(s): LIPASE, AMYLASE in the last 72 hours.  Urine Studies No results for input(s): UHGB, CRYS in the last 72 hours.  Invalid input(s): UACOL, UAPR, USPG, UPH, UTP, UGL, UKET, UBIL, UNIT, UROB, ULEU, UEPI, UWBC, URBC, UBAC, CAST, UCOM, BILUA  MICROBIOLOGY: No results found for this or any previous visit (from the past 240 hour(s)).  RADIOLOGY STUDIES/RESULTS: Dg Chest 2 View  04/13/2015  CLINICAL DATA:  Fall today with bilateral leg pain and weakness EXAM: CHEST - 2 VIEW COMPARISON:  02/11/2015 FINDINGS: Cardiac shadow remains enlarged. Patient is rotated to the right accentuating the mediastinal markings. No focal infiltrate or sizable effusion is seen. No acute bony abnormality is noted. IMPRESSION: No active disease. Electronically Signed   By: Alcide Clever M.D.   On: 04/13/2015 16:08   Mr Brain Wo Contrast  04/14/2015  CLINICAL DATA:  Initial evaluation for global weakness. History of multiple sclerosis. EXAM: MRI HEAD WITHOUT CONTRAST TECHNIQUE: Multiplanar, multiecho pulse  sequences of the brain and surrounding structures were obtained without intravenous contrast. COMPARISON:  Prior CT from 10/01/2014. FINDINGS: Study is limited as the patient was unable to tolerate the full length of the exam. Diffusion-weighted sequences, sagittal T1, axial T2, and axial FLAIR sequences were performed. Diffuse prominence of the CSF containing spaces is compatible with generalized cerebral atrophy. Patchy T2/FLAIR hyperintensity within the periventricular and deep white matter present. While findings may be related to chronic small vessel ischemic in a, multiple sclerosis could also have this appearance given provided history. There are scattered superimposed small 0 chronic lacunar infarcts involving the periventricular white matter as well as the basal ganglia and thalami. Remote lacunar infarct within the pons as well. Diffusion-weighted sequences demonstrates subtle diffusion abnormality within the left aspect of the anterior genu of the corpus callosum (series 5, image 31). This extends superiorly and posteriorly within the left parasagittal region. No significant signal dropout seen on corresponding ADC map. Finding may reflect a small focus of subacute ischemia. Active demyelination could also be considered as there is mildly hyperintense T2/FLAIR signal intensity associated with this signal abnormality. No other evidence for acute infarct. Major intracranial vascular flow voids are maintained. No mass lesion, midline shift, or mass effect. Ventricular prominence related to global parenchymal volume loss present without hydrocephalus. No extra-axial fluid collection. Craniocervical junction within normal limits. Degenerative disc bulge noted at C2-3 within the upper cervical spine with associated mild to moderate canal stenosis. Pituitary gland normal.  No acute abnormality about the orbits. Mild mucosal thickening within the ethmoidal air cells. Paranasal sinuses are otherwise clear. No  mastoid effusion. Bone marrow signal intensity normal. No scalp soft tissue abnormality. IMPRESSION: 1. Subtle patchy diffusion signal abnormality within the left aspect of the anterior genu of the corpus callosum with extension into the parasagittal left frontal lobe. Finding favored to reflect small foci of subacute ischemia. Possible active demyelination could also be considered given the provided history of multiple sclerosis, although this is less favored. No associated mass effect. 2. Confluent and patchy T2/FLAIR hyperintensity within the periventricular and deep white matter both cerebral hemispheres. Finding favored to in large part  reflect sequela of moderate chronic small vessel ischemic changes, although again, demyelinating disease could also have this appearance. 3. Scattered remote lacunar infarcts involving the periventricular white matter of the bilateral corona radiata as well as the basal ganglia, thalami, and pons. Electronically Signed   By: Rise Mu M.D.   On: 04/14/2015 01:25    Jeoffrey Massed, MD  [facility] Pager:[phone]  If 7PM-7AM, please contact night-coverage [email or web address] Password [email or web address] 04/14/2015, 11:49 AM   LOS: 1 day

## 2015-04-14 NOTE — Consult Note (Signed)
Requesting Physician: Dr Jerral Powell    Chief Complaint:  Subacute CVA  HPI:                                                                                                                                         Martin Powell is an 70 y.o. male with known MS which effected his right LE greater than left. Currently on Tecfidera. Patient has come to hospital due to increased LE weakness bilaterally.  HE has chronic LE weakness and uses a walker. States he has not and does not feel he is a fall risk.  He usually "gets around well with his walker".  Yesterday he awoke and could not holds himself up with either leg. HE states his knees would give out on him.  HE came to ED where he had a fever and elevated WBC along with bilateral LE cellulitis. MRI brain was obtained to ensure he was not having a MS flair.  This showed a small acute CVA.  HE is having no lateralizing or localizing symptoms from this.  HE does have Afib and is on no AC.  He currently in NSR.  Blood cultures are pending. Stroke work up ordered and pending.   Date last known well: Unable to determine Time last known well: Unable to determine tPA Given: No: no LSN     Past Medical History  Diagnosis Date  . Hypertension   . GERD (gastroesophageal reflux disease)   . Hypercholesteremia   . Thyroid activity decreased   . Multiple sclerosis (HCC)   . Murmur, heart     Past Surgical History  Procedure Laterality Date  . Appendectomy      Family History  Problem Relation Age of Onset  . CAD Neg Hx    Social History:  reports that he has never smoked. He does not have any smokeless tobacco history on file. He reports that he does not drink alcohol or use illicit drugs.  Allergies: No Known Allergies  Medications:                                                                                                                           Prior to Admission:  Prescriptions prior to admission  Medication Sig Dispense Refill Last Dose   . acetaminophen (TYLENOL) 325 MG tablet Take 325 mg by mouth every 6 (six) hours  as needed for mild pain or moderate pain.   04/13/2015 at Unknown time  . amiodarone (PACERONE) 200 MG tablet Take 200 mg by mouth daily.   04/13/2015 at Unknown time  . aspirin 81 MG tablet Take 81 mg by mouth daily.   04/13/2015 at Unknown time  . atorvastatin (LIPITOR) 80 MG tablet Take 80 mg by mouth daily.   04/13/2015 at Unknown time  . Cholecalciferol (VITAMIN D3) 2000 UNITS TABS Take 1 tablet by mouth daily.   04/13/2015 at Unknown time  . Dimethyl Fumarate (TECFIDERA) 240 MG CPDR Take 240 mg by mouth 2 (two) times daily.    04/13/2015 at Unknown time  . gabapentin (NEURONTIN) 300 MG capsule Take 300 mg by mouth 3 (three) times daily.   04/13/2015 at Unknown time  . HYDROcodone-acetaminophen (NORCO/VICODIN) 5-325 MG per tablet Take 1 tablet by mouth every 6 (six) hours as needed. (Patient taking differently: Take 1 tablet by mouth every 6 (six) hours as needed for moderate pain. ) 8 tablet 0 Past Week at Unknown time  . levothyroxine (SYNTHROID, LEVOTHROID) 125 MCG tablet Take 125 mcg by mouth daily before breakfast.   04/13/2015 at Unknown time  . metoprolol (LOPRESSOR) 50 MG tablet Take 50 mg by mouth 2 (two) times daily.   04/13/2015 at 0900  . feeding supplement (BOOST / RESOURCE BREEZE) LIQD Take 1 Container by mouth daily at 3 pm. (Patient not taking: Reported on 04/14/2015)  0   . feeding supplement, ENSURE ENLIVE, (ENSURE ENLIVE) LIQD Take 237 mLs by mouth daily. (Patient not taking: Reported on 04/14/2015) 237 mL 12    Scheduled: . amiodarone  200 mg Oral Daily  . aspirin  300 mg Rectal Daily   Or  . aspirin  325 mg Oral Daily  . atorvastatin  80 mg Oral q1800  . Dimethyl Fumarate  240 mg Oral BID  . enoxaparin (LOVENOX) injection  40 mg Subcutaneous QHS  . gabapentin  300 mg Oral TID  . levothyroxine  125 mcg Oral QAC breakfast  . metoprolol  50 mg Oral BID  . piperacillin-tazobactam (ZOSYN)  IV  3.375 g  Intravenous 3 times per day  . vancomycin  1,250 mg Intravenous Q12H    ROS:                                                                                                                                       History obtained from the patient  General ROS: positive for - fatigue, fever,  Psychological ROS: negative for - behavioral disorder, hallucinations, memory difficulties, mood swings or suicidal ideation Ophthalmic ROS: negative for - blurry vision, double vision, eye pain or loss of vision ENT ROS: negative for - epistaxis, nasal discharge, oral lesions, sore throat, tinnitus or vertigo Allergy and Immunology ROS: negative for - hives or itchy/watery eyes Hematological and Lymphatic ROS: negative for - bleeding problems, bruising or swollen  lymph nodes Endocrine ROS: negative for - galactorrhea, hair pattern changes, polydipsia/polyuria or temperature intolerance Respiratory ROS: negative for - cough, hemoptysis, shortness of breath or wheezing Cardiovascular ROS: negative for - chest pain, dyspnea on exertion, edema or irregular heartbeat Gastrointestinal ROS: negative for - abdominal pain, diarrhea, hematemesis, nausea/vomiting or stool incontinence Genito-Urinary ROS: negative for - dysuria, hematuria, incontinence or urinary frequency/urgency Musculoskeletal ROS: positive for -  muscular weakness Neurological ROS: as noted in HPI Dermatological ROS: positive for LE cellulitis  Neurologic Examination:                                                                                                      Blood pressure 128/52, pulse 74, temperature 100.3 F (37.9 C), temperature source Oral, resp. rate 22, height 6\' 3"  (1.905 m), weight 109.77 kg (242 lb), SpO2 94 %.  HEENT-  Normocephalic, no lesions, without obvious abnormality.  Normal external eye and conjunctiva.  Normal TM's bilaterally.  Normal auditory canals and external ears. Normal external nose, mucus membranes and  septum.  Normal pharynx. Cardiovascular- S1, S2 normal, pulses palpable throughout   Lungs- no tachypnea, retractions or cyanosis Abdomen- normal findings: bowel sounds normal Extremities- LE cellulitis Lymph-no adenopathy palpable Musculoskeletal-no joint tenderness, deformity or swelling Skin-LE Cellulitis, oozing and venous insufficiency  Neurological Examination Mental Status: Alert, oriented, thought content appropriate.  Speech fluent without evidence of aphasia.  Able to follow 3 step commands without difficulty. Cranial Nerves: II: Discs flat bilaterally; Visual fields grossly normal, pupils equal, round, reactive to light and accommodation III,IV, VI: ptosis present right eye due to old CVA, extra-ocular motions intact bilaterally V,VII: smile asymmetric on right due to old CVA, facial light touch sensation normal bilaterally VIII: hearing normal bilaterally IX,X: uvula rises symmetrically XI: bilateral shoulder shrug XII: midline tongue extension Motor: Right : Upper extremity   4/5 (baseline)  Left:     Upper extremity   5/5  Lower extremity   5/5     Lower extremity   5/5 Tone and bulk:normal tone throughout; no atrophy noted Sensory: Pinprick and light touch intact throughout, bilaterally Deep Tendon Reflexes: 1+ and symmetric throughout Plantars: Right: downgoing   Left: downgoing Cerebellar: normal finger-to-nose,and normal heel-to-shin test Gait: not tested       Lab Results: Basic Metabolic Panel:  Recent Labs Lab 04/13/15 1530 04/14/15 0210  NA 138 137  K 4.0 3.5  CL 105 105  CO2 26 24  GLUCOSE 118* 118*  BUN 15 11  CREATININE 0.92 1.03  CALCIUM 8.8* 8.4*    Liver Function Tests:  Recent Labs Lab 04/13/15 1530 04/14/15 0210  AST 22 22  ALT 13* 12*  ALKPHOS 85 74  BILITOT 1.1 1.5*  PROT 7.2 6.2*  ALBUMIN 3.4* 3.0*   No results for input(s): LIPASE, AMYLASE in the last 168 hours. No results for input(s): AMMONIA in the last 168  hours.  CBC:  Recent Labs Lab 04/13/15 1530 04/14/15 0210 04/14/15 0820  WBC 19.1* 15.5* 12.7*  NEUTROABS 16.9* 13.9*  --   HGB 11.6* 11.2* 11.2*  HCT 36.9* 35.3* 34.9*  MCV 89.6 88.9 90.2  PLT 233 224 187    Cardiac Enzymes: No results for input(s): CKTOTAL, CKMB, CKMBINDEX, TROPONINI in the last 168 hours.  Lipid Panel: No results for input(s): CHOL, TRIG, HDL, CHOLHDL, VLDL, LDLCALC in the last 168 hours.  CBG: No results for input(s): GLUCAP in the last 168 hours.  Microbiology: Results for orders placed or performed during the hospital encounter of 11/08/14  Urine culture     Status: None   Collection Time: 11/08/14 10:10 PM  Result Value Ref Range Status   Specimen Description URINE, CLEAN CATCH  Final   Special Requests NONE  Final   Culture   Final    MULTIPLE SPECIES PRESENT, SUGGEST RECOLLECTION Performed at [facility]    Report Status 11/10/2014 FINAL  Final    Coagulation Studies:  Recent Labs  04/13/15 1530  LABPROT 20.4*  INR 1.75*    Imaging: Dg Chest 2 View  04/13/2015  CLINICAL DATA:  Fall today with bilateral leg pain and weakness EXAM: CHEST - 2 VIEW COMPARISON:  02/11/2015 FINDINGS: Cardiac shadow remains enlarged. Patient is rotated to the right accentuating the mediastinal markings. No focal infiltrate or sizable effusion is seen. No acute bony abnormality is noted. IMPRESSION: No active disease. Electronically Signed   By: Alcide Clever M.D.   On: 04/13/2015 16:08   Mr Brain Wo Contrast  04/14/2015  CLINICAL DATA:  Initial evaluation for global weakness. History of multiple sclerosis. EXAM: MRI HEAD WITHOUT CONTRAST TECHNIQUE: Multiplanar, multiecho pulse sequences of the brain and surrounding structures were obtained without intravenous contrast. COMPARISON:  Prior CT from 10/01/2014. FINDINGS: Study is limited as the patient was unable to tolerate the full length of the exam. Diffusion-weighted sequences, sagittal T1, axial T2,  and axial FLAIR sequences were performed. Diffuse prominence of the CSF containing spaces is compatible with generalized cerebral atrophy. Patchy T2/FLAIR hyperintensity within the periventricular and deep white matter present. While findings may be related to chronic small vessel ischemic in a, multiple sclerosis could also have this appearance given provided history. There are scattered superimposed small 0 chronic lacunar infarcts involving the periventricular white matter as well as the basal ganglia and thalami. Remote lacunar infarct within the pons as well. Diffusion-weighted sequences demonstrates subtle diffusion abnormality within the left aspect of the anterior genu of the corpus callosum (series 5, image 31). This extends superiorly and posteriorly within the left parasagittal region. No significant signal dropout seen on corresponding ADC map. Finding may reflect a small focus of subacute ischemia. Active demyelination could also be considered as there is mildly hyperintense T2/FLAIR signal intensity associated with this signal abnormality. No other evidence for acute infarct. Major intracranial vascular flow voids are maintained. No mass lesion, midline shift, or mass effect. Ventricular prominence related to global parenchymal volume loss present without hydrocephalus. No extra-axial fluid collection. Craniocervical junction within normal limits. Degenerative disc bulge noted at C2-3 within the upper cervical spine with associated mild to moderate canal stenosis. Pituitary gland normal.  No acute abnormality about the orbits. Mild mucosal thickening within the ethmoidal air cells. Paranasal sinuses are otherwise clear. No mastoid effusion. Bone marrow signal intensity normal. No scalp soft tissue abnormality. IMPRESSION: 1. Subtle patchy diffusion signal abnormality within the left aspect of the anterior genu of the corpus callosum with extension into the parasagittal left frontal lobe. Finding  favored to reflect small foci of subacute ischemia. Possible active demyelination could also be considered given the  provided history of multiple sclerosis, although this is less favored. No associated mass effect. 2. Confluent and patchy T2/FLAIR hyperintensity within the periventricular and deep white matter both cerebral hemispheres. Finding favored to in large part reflect sequela of moderate chronic small vessel ischemic changes, although again, demyelinating disease could also have this appearance. 3. Scattered remote lacunar infarcts involving the periventricular white matter of the bilateral corona radiata as well as the basal ganglia, thalami, and pons. Electronically Signed   By: Rise Mu M.D.   On: 04/14/2015 01:25       Assessment and plan discussed with with attending physician and they are in agreement.    Felicie Morn PA-C [facility] [phone]  04/14/2015, 8:56 AM   Assessment: 70 y.o. male with worsening of his lower extremity weakness in the setting of infection. After reviewing his MRI, it is not consistent with symptoms that he is experiencing and I do not feel that it is likely to be ischemic infarct. Is in the same location as other periventricular demyelinating changes that he has experienced in the appearance of it is much more consistent with this than with acute infarct.  It may be reasonable to perform an echocardiogram, but otherwise I do not think that a stroke workup is merited.  For his lower STEMI weakness, it is possible that this is unmasking of previous symptoms in the setting of physiological stress (peeling the onion). It is also possible that his infection has precipitated a multiple sclerosis flare causing new demyelination. If the latter is the case, then once his infection is controlled then I would consider steroids. Demonstration of inflammation with contrast enhancement of the spine would be the best way to determine this.   1)  MRI cervical and thoracic spine with and without contrast 2) physical therapy 3) treatment of underlying infection per internal medicine  Ritta Slot, MD [facility] [phone]  If 7pm- 7am, please page neurology on call as listed in AMION.

## 2015-04-15 ENCOUNTER — Inpatient Hospital Stay (HOSPITAL_COMMUNITY): Payer: Medicare Other

## 2015-04-15 LAB — BASIC METABOLIC PANEL
ANION GAP: 7 (ref 5–15)
BUN: 15 mg/dL (ref 6–20)
CALCIUM: 8.1 mg/dL — AB (ref 8.9–10.3)
CO2: 25 mmol/L (ref 22–32)
CREATININE: 1.15 mg/dL (ref 0.61–1.24)
Chloride: 108 mmol/L (ref 101–111)
GFR calc Af Amer: >60 mL/min (ref >60)
GFR calc non Af Amer: >60 mL/min (ref >60)
GLUCOSE: 88 mg/dL (ref 65–99)
Potassium: 3.4 mmol/L — ABNORMAL LOW (ref 3.5–5.1)
Sodium: 140 mmol/L (ref 135–145)

## 2015-04-15 LAB — CBC
HEMATOCRIT: 32.7 % — AB (ref 39.0–52.0)
Hemoglobin: 10.5 g/dL — ABNORMAL LOW (ref 13.0–17.0)
MCH: 28.8 pg (ref 26.0–34.0)
MCHC: 32.1 g/dL (ref 30.0–36.0)
MCV: 89.8 fL (ref 78.0–100.0)
Platelets: 168 10*3/uL (ref 150–400)
RBC: 3.64 MIL/uL — ABNORMAL LOW (ref 4.22–5.81)
RDW: 14.8 % (ref 11.5–15.5)
WBC: 11.1 10*3/uL — ABNORMAL HIGH (ref 4.0–10.5)

## 2015-04-15 LAB — HEMOGLOBIN A1C
Hgb A1c MFr Bld: 5.3 % (ref 4.8–5.6)
MEAN PLASMA GLUCOSE: 105 mg/dL

## 2015-04-15 MED ORDER — SOTALOL HCL 80 MG PO TABS
40.0000 mg | ORAL_TABLET | Freq: Two times a day (BID) | ORAL | Status: DC
  Administered 2015-04-15 – 2015-04-18 (×6): 40 mg via ORAL
  Filled 2015-04-15 (×8): qty 0.5

## 2015-04-15 MED ORDER — POTASSIUM CHLORIDE CRYS ER 20 MEQ PO TBCR
40.0000 meq | EXTENDED_RELEASE_TABLET | Freq: Once | ORAL | Status: AC
  Administered 2015-04-15: 40 meq via ORAL
  Filled 2015-04-15: qty 2

## 2015-04-15 MED ORDER — LORAZEPAM 2 MG/ML IJ SOLN
1.0000 mg | Freq: Once | INTRAMUSCULAR | Status: AC | PRN
  Administered 2015-04-15: 1 mg via INTRAVENOUS
  Filled 2015-04-15: qty 1

## 2015-04-15 MED ORDER — LEVOTHYROXINE SODIUM 112 MCG PO TABS
112.0000 ug | ORAL_TABLET | Freq: Every day | ORAL | Status: DC
  Administered 2015-04-16 – 2015-04-18 (×3): 112 ug via ORAL
  Filled 2015-04-15 (×3): qty 1

## 2015-04-15 MED ORDER — OXYCODONE HCL 5 MG PO TABS
5.0000 mg | ORAL_TABLET | Freq: Four times a day (QID) | ORAL | Status: DC | PRN
  Administered 2015-04-15 – 2015-04-17 (×4): 5 mg via ORAL
  Filled 2015-04-15 (×5): qty 1

## 2015-04-15 MED ORDER — DILTIAZEM HCL ER BEADS 120 MG PO CP24
120.0000 mg | ORAL_CAPSULE | Freq: Every day | ORAL | Status: DC
  Administered 2015-04-15 – 2015-04-18 (×4): 120 mg via ORAL
  Filled 2015-04-15 (×4): qty 1

## 2015-04-15 NOTE — Evaluation (Signed)
Physical Therapy Evaluation Patient Details Name: Martin Powell MRN: [id number] DOB: Oct 25, 1945 Today's Date: 04/15/2015   History of Present Illness  70 y.o. male admitted with sepsis secondary to lower extremity cellulitis and bilateral LE weakness. PMH: MS, hypertension  Clinical Impression  Pt admitted with above diagnosis. Pt currently with functional limitations due to the deficits listed below (see PT Problem List).  Pt will benefit from skilled PT to increase their independence and safety with mobility to allow discharge to the venue listed below.  Patient able to sit edge of bed and progress to standing with rw and moderate assistance. Patient unable to tolerate ambulation and standing time limited by complaints of increasing LE pain. At this time the patient is hoping to be able to return to home but based on his current mobility, he may need SNF for further rehabilitation prior to returning to home. PT to continue to follow and progress mobility as tolerated.      Follow Up Recommendations SNF;Supervision/Assistance - 24 hour    Equipment Recommendations  None recommended by PT    Recommendations for Other Services       Precautions / Restrictions Precautions Precautions: Fall Restrictions Weight Bearing Restrictions: No      Mobility  Bed Mobility Overal bed mobility: Needs Assistance Bed Mobility: Supine to Sit;Sit to Supine     Supine to sit: Min guard Sit to supine: Min guard   General bed mobility comments: Needing mod assist with scooting in bed.   Transfers Overall transfer level: Needs assistance Equipment used: Rolling walker (2 wheeled) Transfers: Sit to/from Stand Sit to Stand: Mod assist         General transfer comment: patient limited by complaints of pain on bilateral feet.   Ambulation/Gait                Stairs            Wheelchair Mobility    Modified Rankin (Stroke Patients Only)       Balance Overall balance  assessment: Needs assistance Sitting-balance support: No upper extremity supported Sitting balance-Leahy Scale: Fair     Standing balance support: Bilateral upper extremity supported Standing balance-Leahy Scale: Poor Standing balance comment: using rw                             Pertinent Vitals/Pain Pain Assessment: 0-10 Pain Score: 9  Pain Location: LEs, Lt > Rt Pain Descriptors / Indicators: Sore Pain Intervention(s): Limited activity within patient's tolerance;Monitored during session    Home Living Family/patient expects to be discharged to:: Private residence Living Arrangements: Alone Available Help at Discharge: Available PRN/intermittently Type of Home: Apartment Home Access: Level entry     Home Layout: One level Home Equipment: Environmental consultant - 2 wheels Additional Comments: reports having home health PT and nursing    Prior Function Level of Independence: Independent with assistive device(s)         Comments: using rw for mobility     Hand Dominance        Extremity/Trunk Assessment               Lower Extremity Assessment: Generalized weakness         Communication   Communication: No difficulties  Cognition Arousal/Alertness: Awake/alert Behavior During Therapy: WFL for tasks assessed/performed Overall Cognitive Status: Within Functional Limits for tasks assessed  General Comments      Exercises        Assessment/Plan    PT Assessment Patient needs continued PT services  PT Diagnosis Difficulty walking   PT Problem List Decreased strength;Decreased range of motion;Decreased activity tolerance;Decreased balance;Decreased mobility;Pain  PT Treatment Interventions DME instruction;Gait training;Functional mobility training;Therapeutic activities;Therapeutic exercise;Balance training;Patient/family education   PT Goals (Current goals can be found in the Care Plan section) Acute Rehab PT  Goals Patient Stated Goal: less pain in feet and go back home PT Goal Formulation: With patient Time For Goal Achievement: 04/29/15 Potential to Achieve Goals: Good    Frequency Min 3X/week   Barriers to discharge Decreased caregiver support      Co-evaluation               End of Session Equipment Utilized During Treatment: Gait belt Activity Tolerance: Patient limited by pain Patient left: in bed;with call bell/phone within reach;with bed alarm set Nurse Communication: Mobility status;Precautions         Time: 4098-1191 PT Time Calculation (min) (ACUTE ONLY): 25 min   Charges:   PT Evaluation $PT Eval Moderate Complexity: 1 Procedure PT Treatments $Therapeutic Activity: 8-22 mins   PT G Codes:        Christiane Ha, PT, CSCS Pager [phone] Office [phone]  04/15/2015, 4:06 PM

## 2015-04-15 NOTE — Progress Notes (Signed)
Subjective: Feels possibly slightly improved in LE  Exam: Filed Vitals:   04/15/15 1046 04/15/15 1346  BP: 139/76 125/62  Pulse: 81 69  Temp: 98 F (36.7 C) 99.6 F (37.6 C)  Resp: 18 16   Gen: In bed, NAD Resp: non-labored breathing, no acute distress Abd: soft, nt  Neuro: MS: awake, alert. YH:OOIL Motor: 5/5 throughout  MRI brain - cervical stenosis.   Impression: 70 yo M with worsening gait. I suspect he has some compromise from his cervical stenosis which is unmasked in the setting of physiological stressor. I do not think that the possible brain lesion is responsible and therefore do not recommend steroids from an MS standpoint.   I would discuss with neurosurgery his stenosis, sometimes steroids can be used for this, but unclear to me if the benefit outweighs the risk in someone just starting to improve from sepsis.   Recommendations: 1) Would discuss with neurosurgery.  2) No further recommendations at this time. Please call with further questions or concerns.   Ritta Slot, MD [facility] [phone]  If 7pm- 7am, please page neurology on call as listed in AMION.

## 2015-04-15 NOTE — Care Management Note (Signed)
Case Management Note  Patient Details  Name: Aquil Duhe MRN: [id number] Date of Birth: 10-29-1945  Subjective/Objective:      Patient is from [facility], per pt eval rec SNF, CSW referral.  Patient with sepsis, febrile, has bad wounds.     Action/Plan:   Expected Discharge Date:  04/18/15               Expected Discharge Plan:  Assisted Living / Rest Home  In-House Referral:  Clinical Social Work  Discharge planning Services  CM Consult  Post Acute Care Choice:    Choice offered to:     DME Arranged:    DME Agency:     HH Arranged:    HH Agency:     Status of Service:  In process, will continue to follow  Medicare Important Message Given:    Date Medicare IM Given:    Medicare IM give by:    Date Additional Medicare IM Given:    Additional Medicare Important Message give by:     If discussed at Long Length of Stay Meetings, dates discussed:    Additional Comments:  Leone Haven, RN 04/15/2015, 8:20 PM

## 2015-04-15 NOTE — Progress Notes (Signed)
PATIENT DETAILS Name: Martin Powell Age: 70 y.o. Sex: male Date of Birth: 1946-01-09 Admit Date: 04/13/2015 Admitting Physician Edsel Petrin, DO PCP:No PCP Per Patient  Subjective: Feels better-claims to have more energy.  Assessment/Plan: Principal Problem: Sepsis secondary to lower extremity cellulitis: Secondary to left more than right lower extremity cellulitis. Fever curve seems to be improving, leukocytosis downtrending-Continue empiric antibiotics.Blood cultures negative so far.Lower extremity Dopplers negative for DVT.   Active Problems: Worsening lower extremity weakness: Suspect secondary to sepsis. However MRI brain shows either a small CVA or a demyelinating changes. Neurology consulted, recommendations were for MRI of the cervical and thoracic spine-which does not show any major abnormalities. Await physical therapy evaluation. Neurology does not recommends- not felt to be consistent with a CVA at this time.  History of multiple sclerosis: See above-neurology following. If weakness does not improve with treatment of underlying infection/PT evaluation-may need to consider steroids.  History of atrial fibrillation: remains in sinus rhythm.Reviewed updated home medication list-reviewed recent discharge summary indicated everywhere from [facility].  It appears patient has been taking sotalol, Cardizem and  Xarelto till recently-but now on Amiodarone and Metoprolol. Continue Xarelto, amiodarone and metoprolol for now-his sister will provide [place] a updated list of his medications  Hypothyroidism: Continue Synthroid  Dyslipidemia: Continue statins  Scrotal edema/penile edema: Chronic issue per patient-has this for years. Per patient-he does not have any issues with urination. Follow.  Disposition: Remain inpatient-await PT eval-may need SNF  Antimicrobial agents  See below  Anti-infectives    Start     Dose/Rate Route Frequency Ordered Stop    04/14/15 0430  vancomycin (VANCOCIN) IVPB 750 mg/150 ml premix  Status:  Discontinued     750 mg 150 mL/hr over 60 Minutes Intravenous Every 12 hours 04/13/15 1623 04/13/15 2332   04/14/15 0030  piperacillin-tazobactam (ZOSYN) IVPB 3.375 g     3.375 g 12.5 mL/hr over 240 Minutes Intravenous 3 times per day 04/13/15 1623     04/14/15 0000  vancomycin (VANCOCIN) IVPB 750 mg/150 ml premix  Status:  Discontinued     750 mg 150 mL/hr over 60 Minutes Intravenous Every 12 hours 04/13/15 2332 04/13/15 2339   04/14/15 0000  vancomycin (VANCOCIN) 1,250 mg in sodium chloride 0.9 % 250 mL IVPB     1,250 mg 166.7 mL/hr over 90 Minutes Intravenous Every 12 hours 04/13/15 2339     04/13/15 2345  vancomycin (VANCOCIN) IVPB 1000 mg/200 mL premix  Status:  Discontinued     1,000 mg 200 mL/hr over 60 Minutes Intravenous STAT 04/13/15 2322 04/13/15 2331   04/13/15 2330  piperacillin-tazobactam (ZOSYN) IVPB 3.375 g  Status:  Discontinued     3.375 g 100 mL/hr over 30 Minutes Intravenous  Once 04/13/15 2322 04/13/15 2330   04/13/15 1530  piperacillin-tazobactam (ZOSYN) IVPB 3.375 g     3.375 g 100 mL/hr over 30 Minutes Intravenous  Once 04/13/15 1528 04/13/15 1614   04/13/15 1530  vancomycin (VANCOCIN) IVPB 1000 mg/200 mL premix     1,000 mg 200 mL/hr over 60 Minutes Intravenous  Once 04/13/15 1528 04/13/15 1719   04/13/15 1530  vancomycin (VANCOCIN) IVPB 1000 mg/200 mL premix  Status:  Discontinued     1,000 mg 200 mL/hr over 60 Minutes Intravenous  Once 04/13/15 1528 04/13/15 2330      DVT Prophylaxis: Prophylactic Lovenox   Code Status: Full code   Family Communication  Sister over the phone  Procedures: None  CONSULTS:  neurology  Time spent 25 minutes-Greater than 50% of this time was spent in counseling, explanation of diagnosis, planning of further management, and coordination of care.  MEDICATIONS: Scheduled Meds: . amiodarone  200 mg Oral Daily  . atorvastatin  80 mg Oral  q1800  . Dimethyl Fumarate  240 mg Oral BID  . gabapentin  300 mg Oral TID  . levothyroxine  125 mcg Oral QAC breakfast  . LORazepam  0.5 mg Intravenous Once  . metoprolol  50 mg Oral BID  . multivitamin with minerals  1 tablet Oral Daily  . piperacillin-tazobactam (ZOSYN)  IV  3.375 g Intravenous 3 times per day  . potassium chloride  40 mEq Oral Once  . rivaroxaban  20 mg Oral Q supper  . vancomycin  1,250 mg Intravenous Q12H   Continuous Infusions:   PRN Meds:.acetaminophen **OR** acetaminophen, ondansetron **OR** ondansetron (ZOFRAN) IV, oxyCODONE    PHYSICAL EXAM: Vital signs in last 24 hours: Filed Vitals:   04/15/15 0400 04/15/15 0641 04/15/15 1046 04/15/15 1346  BP:  138/62 139/76 125/62  Pulse:  70 81 69  Temp: 99.8 F (37.7 C) 98.7 F (37.1 C) 98 F (36.7 C) 99.6 F (37.6 C)  TempSrc: Oral Oral Oral Oral  Resp:  Height:      Weight:      SpO2:  98% 93% 93%    Weight change:  Filed Weights   04/13/15 1446  Weight: 109.77 kg (242 lb)   Body mass index is 30.25 kg/(m^2).   Gen Exam: Awake and alert with clear speech.   Neck: Supple, No JVD.   Chest: B/L Clear.   CVS: S1 S2 Regular, no murmurs.  Abdomen: soft, BS +, non tender, non distended.  Extremities: Right leg decreased erythema-left leg with some decrease in erythema as well. Neurologic: Non Focal.   Skin: No Rash.   Wounds: N/A.    Intake/Output from previous day:  Intake/Output Summary (Last 24 hours) at 04/15/15 1523 Last data filed at 04/15/15 1443  Gross per 24 hour  Intake    600 ml  Output      0 ml  Net    600 ml     LAB RESULTS: CBC  Recent Labs Lab 04/13/15 1530 04/14/15 0210 04/14/15 0820 04/15/15 0614  WBC 19.1* 15.5* 12.7* 11.1*  HGB 11.6* 11.2* 11.2* 10.5*  HCT 36.9* 35.3* 34.9* 32.7*  PLT 233 224 187 168  MCV 89.6 88.9 90.2 89.8  MCH 28.2 28.2 28.9 28.8  MCHC 31.4 31.7 32.1 32.1  RDW 14.2 14.3 14.7 14.8  LYMPHSABS 0.8 0.7  --   --   MONOABS 1.3*  0.8  --   --   EOSABS 0.1 0.0  --   --   BASOSABS 0.1 0.0  --   --     Chemistries   Recent Labs Lab 04/13/15 1530 04/14/15 0210 04/14/15 0820 04/15/15 0614  NA 138 137  --  140  K 4.0 3.5  --  3.4*  CL 105 105  --  108  CO2 26 24  --  25  GLUCOSE 118* 118*  --  88  BUN 15 11  --  15  CREATININE 0.92 1.03 1.10 1.15  CALCIUM 8.8* 8.4*  --  8.1*    CBG: No results for input(s): GLUCAP in the last 168 hours.  GFR Estimated Creatinine Clearance: 81.1 mL/min (by C-G formula based on  Cr of 1.15).  Coagulation profile  Recent Labs Lab 04/13/15 1530  INR 1.75*    Cardiac Enzymes No results for input(s): CKMB, TROPONINI, MYOGLOBIN in the last 168 hours.  Invalid input(s): CK  Invalid input(s): POCBNP No results for input(s): DDIMER in the last 72 hours.  Recent Labs  04/14/15 0820  HGBA1C 5.3    Recent Labs  04/14/15 0820  CHOL 80  HDL 37*  LDLCALC 33  TRIG 48  CHOLHDL 2.2   No results for input(s): TSH, T4TOTAL, T3FREE, THYROIDAB in the last 72 hours.  Invalid input(s): FREET3 No results for input(s): VITAMINB12, FOLATE, FERRITIN, TIBC, IRON, RETICCTPCT in the last 72 hours. No results for input(s): LIPASE, AMYLASE in the last 72 hours.  Urine Studies No results for input(s): UHGB, CRYS in the last 72 hours.  Invalid input(s): UACOL, UAPR, USPG, UPH, UTP, UGL, UKET, UBIL, UNIT, UROB, ULEU, UEPI, UWBC, URBC, UBAC, CAST, UCOM, BILUA  MICROBIOLOGY: Recent Results (from the past 240 hour(s))  Culture, blood (x 2)     Status: None (Preliminary result)   Collection Time: 04/14/15  1:49 AM  Result Value Ref Range Status   Specimen Description BLOOD RIGHT ANTECUBITAL  Final   Special Requests BOTTLES DRAWN AEROBIC AND ANAEROBIC   Final   Culture NO GROWTH 1 DAY  Final   Report Status PENDING  Incomplete  Culture, blood (x 2)     Status: None (Preliminary result)   Collection Time: 04/14/15  1:55 AM  Result Value Ref Range Status   Specimen  Description BLOOD RIGHT ARM  Final   Special Requests BOTTLES DRAWN AEROBIC ONLY  Final   Culture NO GROWTH 1 DAY  Final   Report Status PENDING  Incomplete    RADIOLOGY STUDIES/RESULTS: Dg Chest 2 View  04/13/2015  CLINICAL DATA:  Fall today with bilateral leg pain and weakness EXAM: CHEST - 2 VIEW COMPARISON:  02/11/2015 FINDINGS: Cardiac shadow remains enlarged. Patient is rotated to the right accentuating the mediastinal markings. No focal infiltrate or sizable effusion is seen. No acute bony abnormality is noted. IMPRESSION: No active disease. Electronically Signed   By: Alcide Clever M.D.   On: 04/13/2015 16:08   Mr Brain Wo Contrast  04/14/2015  CLINICAL DATA:  Initial evaluation for global weakness. History of multiple sclerosis. EXAM: MRI HEAD WITHOUT CONTRAST TECHNIQUE: Multiplanar, multiecho pulse sequences of the brain and surrounding structures were obtained without intravenous contrast. COMPARISON:  Prior CT from 10/01/2014. FINDINGS: Study is limited as the patient was unable to tolerate the full length of the exam. Diffusion-weighted sequences, sagittal T1, axial T2, and axial FLAIR sequences were performed. Diffuse prominence of the CSF containing spaces is compatible with generalized cerebral atrophy. Patchy T2/FLAIR hyperintensity within the periventricular and deep white matter present. While findings may be related to chronic small vessel ischemic in a, multiple sclerosis could also have this appearance given provided history. There are scattered superimposed small 0 chronic lacunar infarcts involving the periventricular white matter as well as the basal ganglia and thalami. Remote lacunar infarct within the pons as well. Diffusion-weighted sequences demonstrates subtle diffusion abnormality within the left aspect of the anterior genu of the corpus callosum (series 5, image 31). This extends superiorly and posteriorly within the left parasagittal region. No significant signal  dropout seen on corresponding ADC map. Finding may reflect a small focus of subacute ischemia. Active demyelination could also be considered as there is mildly hyperintense T2/FLAIR signal intensity associated with this signal  abnormality. No other evidence for acute infarct. Major intracranial vascular flow voids are maintained. No mass lesion, midline shift, or mass effect. Ventricular prominence related to global parenchymal volume loss present without hydrocephalus. No extra-axial fluid collection. Craniocervical junction within normal limits. Degenerative disc bulge noted at C2-3 within the upper cervical spine with associated mild to moderate canal stenosis. Pituitary gland normal.  No acute abnormality about the orbits. Mild mucosal thickening within the ethmoidal air cells. Paranasal sinuses are otherwise clear. No mastoid effusion. Bone marrow signal intensity normal. No scalp soft tissue abnormality. IMPRESSION: 1. Subtle patchy diffusion signal abnormality within the left aspect of the anterior genu of the corpus callosum with extension into the parasagittal left frontal lobe. Finding favored to reflect small foci of subacute ischemia. Possible active demyelination could also be considered given the provided history of multiple sclerosis, although this is less favored. No associated mass effect. 2. Confluent and patchy T2/FLAIR hyperintensity within the periventricular and deep white matter both cerebral hemispheres. Finding favored to in large part reflect sequela of moderate chronic small vessel ischemic changes, although again, demyelinating disease could also have this appearance. 3. Scattered remote lacunar infarcts involving the periventricular white matter of the bilateral corona radiata as well as the basal ganglia, thalami, and pons. Electronically Signed   By: Rise Mu M.D.   On: 04/14/2015 01:25   Mr Cervical Spine Wo Contrast  04/15/2015  CLINICAL DATA:  History of multiple  sclerosis with worsening lower extremity weakness bilaterally. Fever and elevated white blood cell count. Initial encounter. EXAM: MRI CERVICAL AND THORACIC SPINE WITHOUT CONTRAST TECHNIQUE: Multiplanar and multiecho pulse sequences of the cervical spine, to include the craniocervical junction and cervicothoracic junction, and thoracic spine, were obtained without intravenous contrast. COMPARISON:  None. FINDINGS: MRI CERVICAL SPINE FINDINGS The scan toward with and without contrast. Contrast could not be administered as the patient could not tolerate additional scanning. Vertebral body height and alignment are maintained. Marrow signal is mildly heterogeneous but no worrisome marrow lesion is identified. The craniocervical junction is normal. Small foci of T2 hyperintensity in the pons are likely due to chronic ischemic change. Very mild, hazy edema is seen within the cervical cord at C3-4. Cervical cord signal is otherwise unremarkable. Imaged paraspinous structures appear normal. C2-3: A central disc protrusion contacts and mildly deforms the ventral cord. The foramina are open. Facet degenerative change is noted. C3-4: There is a large broad-based central disc protrusion causing severe central canal stenosis and marked flattening of the cord. Uncovertebral disease causes severe bilateral foraminal narrowing. C4-5: There is a shallow disc bulge and central protrusion. The ventral thecal sac is effaced. Mild to moderate foraminal narrowing is seen bilaterally. C5-6: The patient has a disc bulge and right worse than left uncovertebral disease. There is deformity of the ventral cord. Moderate left and moderately severe to severe right foraminal narrowing is present. C6-7: A disc osteophyte complex and uncovertebral disease are seen. There is slight deformity of the ventral cord. Moderate right foraminal narrowing is present. The left foramen is open. C7-T1:  Negative. MRI THORACIC SPINE FINDINGS There is  exaggeration of the normal thoracic kyphosis. Vertebral body height, signal and alignment are maintained. The thoracic cord demonstrates normal signal throughout. Imaged paraspinous structures are unremarkable. T1-2 to T4-5 are negative. T5-6: There is a shallow right paracentral protrusion but the central canal and foramina are open. T6-7: Minimal disc bulge without central canal or foraminal stenosis. T7-8: Minimal disc bulge and facet degenerative change. The  central canal and foramina are open. T8-9: Minimal disc bulge and facet degenerative change. The central canal and foramina are open. T9-10: Shallow disc bulge the right and facet arthropathy without central canal or foraminal stenosis. T10-11: Facet degenerative disease.  Otherwise negative. T11-12: Facet degenerative change.  Otherwise negative. T12-L1:  Facet degenerative disease.  Otherwise negative. IMPRESSION: Negative for evidence of infectious or inflammatory process. No finding to explain the patient's fever and white blood cell count. Very mild, hazy edema within the cervical cord is present at C3-4. While this could be due to multiple sclerosis, it is likely secondary to spondylosis with a broad-based disc bulge causing severe central canal stenosis and marked flattening of the cord identified. The foramina are also severely narrowed at this level. Central disc protrusion at C2-3 contacts and slightly deforms the ventral cord. Deformity of the ventral cord at C5-6 where there is also moderately severe to severe right foraminal narrowing and moderate left foraminal narrowing. Slight deformity of the ventral cord at C6-7 due to a shallow disc bulge. Moderate right foraminal narrowing is present at this level. Mild thoracic spondylosis without notable central canal narrowing. The thoracic cord demonstrates normal signal throughout. Electronically Signed   By: Drusilla Kanner M.D.   On: 04/15/2015 10:49   Mr Thoracic Spine Wo Contrast  04/15/2015   CLINICAL DATA:  History of multiple sclerosis with worsening lower extremity weakness bilaterally. Fever and elevated white blood cell count. Initial encounter. EXAM: MRI CERVICAL AND THORACIC SPINE WITHOUT CONTRAST TECHNIQUE: Multiplanar and multiecho pulse sequences of the cervical spine, to include the craniocervical junction and cervicothoracic junction, and thoracic spine, were obtained without intravenous contrast. COMPARISON:  None. FINDINGS: MRI CERVICAL SPINE FINDINGS The scan toward with and without contrast. Contrast could not be administered as the patient could not tolerate additional scanning. Vertebral body height and alignment are maintained. Marrow signal is mildly heterogeneous but no worrisome marrow lesion is identified. The craniocervical junction is normal. Small foci of T2 hyperintensity in the pons are likely due to chronic ischemic change. Very mild, hazy edema is seen within the cervical cord at C3-4. Cervical cord signal is otherwise unremarkable. Imaged paraspinous structures appear normal. C2-3: A central disc protrusion contacts and mildly deforms the ventral cord. The foramina are open. Facet degenerative change is noted. C3-4: There is a large broad-based central disc protrusion causing severe central canal stenosis and marked flattening of the cord. Uncovertebral disease causes severe bilateral foraminal narrowing. C4-5: There is a shallow disc bulge and central protrusion. The ventral thecal sac is effaced. Mild to moderate foraminal narrowing is seen bilaterally. C5-6: The patient has a disc bulge and right worse than left uncovertebral disease. There is deformity of the ventral cord. Moderate left and moderately severe to severe right foraminal narrowing is present. C6-7: A disc osteophyte complex and uncovertebral disease are seen. There is slight deformity of the ventral cord. Moderate right foraminal narrowing is present. The left foramen is open. C7-T1:  Negative. MRI THORACIC  SPINE FINDINGS There is exaggeration of the normal thoracic kyphosis. Vertebral body height, signal and alignment are maintained. The thoracic cord demonstrates normal signal throughout. Imaged paraspinous structures are unremarkable. T1-2 to T4-5 are negative. T5-6: There is a shallow right paracentral protrusion but the central canal and foramina are open. T6-7: Minimal disc bulge without central canal or foraminal stenosis. T7-8: Minimal disc bulge and facet degenerative change. The central canal and foramina are open. T8-9: Minimal disc bulge and facet degenerative change. The  central canal and foramina are open. T9-10: Shallow disc bulge the right and facet arthropathy without central canal or foraminal stenosis. T10-11: Facet degenerative disease.  Otherwise negative. T11-12: Facet degenerative change.  Otherwise negative. T12-L1:  Facet degenerative disease.  Otherwise negative. IMPRESSION: Negative for evidence of infectious or inflammatory process. No finding to explain the patient's fever and white blood cell count. Very mild, hazy edema within the cervical cord is present at C3-4. While this could be due to multiple sclerosis, it is likely secondary to spondylosis with a broad-based disc bulge causing severe central canal stenosis and marked flattening of the cord identified. The foramina are also severely narrowed at this level. Central disc protrusion at C2-3 contacts and slightly deforms the ventral cord. Deformity of the ventral cord at C5-6 where there is also moderately severe to severe right foraminal narrowing and moderate left foraminal narrowing. Slight deformity of the ventral cord at C6-7 due to a shallow disc bulge. Moderate right foraminal narrowing is present at this level. Mild thoracic spondylosis without notable central canal narrowing. The thoracic cord demonstrates normal signal throughout. Electronically Signed   By: Drusilla Kanner M.D.   On: 04/15/2015 10:49   Ct Abdomen Pelvis W  Contrast  04/14/2015  CLINICAL DATA:  Scrotal swelling.  Increased weakness in the legs. EXAM: CT ABDOMEN AND PELVIS WITH CONTRAST TECHNIQUE: Multidetector CT imaging of the abdomen and pelvis was performed using the standard protocol following bolus administration of intravenous contrast. CONTRAST:  OMNIPAQUE IOHEXOL 300 MG/ML  SOLN COMPARISON:  None. FINDINGS: Lower chest: Small bilateral pleural effusions. Normal heart size. Coronary artery atherosclerosis in the LAD and circumflex. Hepatobiliary: Normal liver. High-density material in the dependent portion of the gallbladder likely reflecting cholelithiasis versus sludge. Pancreas: Normal. Spleen: Normal. Adrenals/Urinary Tract: Normal adrenal glands. Multiple bilateral hypodense renal masses with the largest in the right kidney measuring 3.1 x 3 cm most consistent with cysts. No urolithiasis or obstructive uropathy. Partially decompressed bladder. Stomach/Bowel: No bowel wall thickening or dilatation. Sigmoid diverticulosis without evidence of diverticulitis. No pneumatosis, pneumoperitoneum or portal venous gas. No abdominal or pelvic free fluid. Vascular/Lymphatic: Normal caliber abdominal aorta with atherosclerosis. No lymphadenopathy. Other: No fluid collection or hematoma. Fat containing left inguinal hernia. Musculoskeletal: No lytic or sclerotic osseous lesion. No acute osseous abnormality. IMPRESSION: 1. Small bilateral pleural effusions. 2. Multi vessel coronary artery atherosclerosis. 3. Sigmoid diverticulosis without evidence of diverticulitis. Electronically Signed   By: Elige Ko   On: 04/14/2015 16:40   Ct Tibia Fibula Left W Contrast  04/14/2015  CLINICAL DATA:  Increasing weakness in the legs and difficulty walking. Multiple sclerosis. Erythema in draining wounds along the legs, possible sepsis/cellulitis. EXAM: CT OF THE LEFT TIBIA AND FIBULA WITH CONTRAST; CT OF THE RIGHT TIBIA AND FIBULA WITH CONTRAST TECHNIQUE: Contiguous axial  CT images were obtained through the right lower leg and also through the left lower leg. Dedicated multiplanar reformatting free to leg was performed. CONTRAST:  OMNIPAQUE IOHEXOL 300 MG/ML  SOLN COMPARISON:  None FINDINGS: CT left lower leg: Arterial atherosclerosis noted. No high-grade stenosis in the larger vessels but today's exam was not performed as a CT angiogram. Low grade subcutaneous edema in the distal thigh. No knee effusion. The subcutaneous becomes more confluent in the calf, especially inferiorly, and is generally circumferential. There is atrophy of the calf musculature particularly the posterior compartment, but symmetric to the contralateral side. No abscess or gas tracking in the soft tissues. No tibiotalar joint effusion. No  specific supportive evidence of myositis/fasciitis. Most of the edema appears confined to the subcutaneous tissues. Deeper fatty tissues, for example along Kager's fat pad, do not appear significantly infiltrated. No bony destructive findings characteristic of osteomyelitis. No fracture observed. CT right lower leg: As on the contralateral side, there is low-grade subcutaneous edema circumferentially in the lower thigh which becomes more confluent in the calf region. The degree of subcutaneous edema is symmetric. No abnormal gas tracking within the soft tissues or abscess identified. As compared to the contralateral side, there is slightly more infiltration of Kager's fat pad on the right than the left. The edema is also more confluent tracking in the dorsum of the right foot than the dorsum of the left foot. No significant asymmetry of the musculature. Moderate fatty atrophy of the musculature. No drainable abscess. Symmetric atherosclerosis. No knee or tibiotalar joint effusion. No findings of osteomyelitis by CT. IMPRESSION: 1. The bilateral lower extremity deep edema most confluent in the calves and ankle regions, slightly more prominent on the right side in the  dorsum of the foot. On the left side the edema appears relatively confined to the subcutaneous tissues worse on the right side there is some mild infiltration of Kager's fat pad. No definite evidence of myositis or fasciitis. No gas in the soft tissues. No drainable abscess or findings of osteomyelitis identified in the lower legs. 2. Atherosclerosis. Electronically Signed   By: Gaylyn Rong M.D.   On: 04/14/2015 14:58   Ct Tibia Fibula Right W Contrast  04/14/2015  CLINICAL DATA:  Increasing weakness in the legs and difficulty walking. Multiple sclerosis. Erythema in draining wounds along the legs, possible sepsis/cellulitis. EXAM: CT OF THE LEFT TIBIA AND FIBULA WITH CONTRAST; CT OF THE RIGHT TIBIA AND FIBULA WITH CONTRAST TECHNIQUE: Contiguous axial CT images were obtained through the right lower leg and also through the left lower leg. Dedicated multiplanar reformatting free to leg was performed. CONTRAST:  OMNIPAQUE IOHEXOL 300 MG/ML  SOLN COMPARISON:  None FINDINGS: CT left lower leg: Arterial atherosclerosis noted. No high-grade stenosis in the larger vessels but today's exam was not performed as a CT angiogram. Low grade subcutaneous edema in the distal thigh. No knee effusion. The subcutaneous becomes more confluent in the calf, especially inferiorly, and is generally circumferential. There is atrophy of the calf musculature particularly the posterior compartment, but symmetric to the contralateral side. No abscess or gas tracking in the soft tissues. No tibiotalar joint effusion. No specific supportive evidence of myositis/fasciitis. Most of the edema appears confined to the subcutaneous tissues. Deeper fatty tissues, for example along Kager's fat pad, do not appear significantly infiltrated. No bony destructive findings characteristic of osteomyelitis. No fracture observed. CT right lower leg: As on the contralateral side, there is low-grade subcutaneous edema circumferentially in the lower  thigh which becomes more confluent in the calf region. The degree of subcutaneous edema is symmetric. No abnormal gas tracking within the soft tissues or abscess identified. As compared to the contralateral side, there is slightly more infiltration of Kager's fat pad on the right than the left. The edema is also more confluent tracking in the dorsum of the right foot than the dorsum of the left foot. No significant asymmetry of the musculature. Moderate fatty atrophy of the musculature. No drainable abscess. Symmetric atherosclerosis. No knee or tibiotalar joint effusion. No findings of osteomyelitis by CT. IMPRESSION: 1. The bilateral lower extremity deep edema most confluent in the calves and ankle regions, slightly more prominent  on the right side in the dorsum of the foot. On the left side the edema appears relatively confined to the subcutaneous tissues worse on the right side there is some mild infiltration of Kager's fat pad. No definite evidence of myositis or fasciitis. No gas in the soft tissues. No drainable abscess or findings of osteomyelitis identified in the lower legs. 2. Atherosclerosis. Electronically Signed   By: Gaylyn Rong M.D.   On: 04/14/2015 14:58    Jeoffrey Massed, MD  [facility] Pager:[phone]  If 7PM-7AM, please contact night-coverage [email or web address] Password [email or web address] 04/15/2015, 3:23 PM   LOS: 2 days

## 2015-04-16 ENCOUNTER — Inpatient Hospital Stay (HOSPITAL_COMMUNITY): Payer: Medicare Other

## 2015-04-16 DIAGNOSIS — I361 Nonrheumatic tricuspid (valve) insufficiency: Secondary | ICD-10-CM

## 2015-04-16 LAB — URINALYSIS, ROUTINE W REFLEX MICROSCOPIC
Bilirubin Urine: NEGATIVE
Glucose, UA: NEGATIVE mg/dL
Ketones, ur: NEGATIVE mg/dL
LEUKOCYTES UA: NEGATIVE
Nitrite: NEGATIVE
Protein, ur: NEGATIVE mg/dL
SPECIFIC GRAVITY, URINE: 1.021 (ref 1.005–1.030)
pH: 6 (ref 5.0–8.0)

## 2015-04-16 LAB — CBC
HEMATOCRIT: 33.2 % — AB (ref 39.0–52.0)
Hemoglobin: 10.2 g/dL — ABNORMAL LOW (ref 13.0–17.0)
MCH: 27.6 pg (ref 26.0–34.0)
MCHC: 30.7 g/dL (ref 30.0–36.0)
MCV: 90 fL (ref 78.0–100.0)
PLATELETS: 157 10*3/uL (ref 150–400)
RBC: 3.69 MIL/uL — ABNORMAL LOW (ref 4.22–5.81)
RDW: 14.3 % (ref 11.5–15.5)
WBC: 5.9 10*3/uL (ref 4.0–10.5)

## 2015-04-16 LAB — VANCOMYCIN, TROUGH: Vancomycin Tr: 13 ug/mL (ref 10.0–20.0)

## 2015-04-16 LAB — BASIC METABOLIC PANEL
Anion gap: 8 (ref 5–15)
BUN: 14 mg/dL (ref 6–20)
CALCIUM: 8.1 mg/dL — AB (ref 8.9–10.3)
CO2: 25 mmol/L (ref 22–32)
CREATININE: 0.95 mg/dL (ref 0.61–1.24)
Chloride: 107 mmol/L (ref 101–111)
GFR calc Af Amer: >60 mL/min (ref >60)
GFR calc non Af Amer: >60 mL/min (ref >60)
GLUCOSE: 94 mg/dL (ref 65–99)
Potassium: 4 mmol/L (ref 3.5–5.1)
Sodium: 140 mmol/L (ref 135–145)

## 2015-04-16 LAB — URINE MICROSCOPIC-ADD ON

## 2015-04-16 LAB — GLUCOSE, CAPILLARY: Glucose-Capillary: 85 mg/dL (ref 65–99)

## 2015-04-16 NOTE — Progress Notes (Signed)
Per Ortho Sheran Lawless, Aspen cervical collar has been ordered but will not be available until tomorrow (04/17/15) for placement on patient. Will continue to monitor.

## 2015-04-16 NOTE — Progress Notes (Signed)
PATIENT DETAILS Name: Martin Powell Age: 70 y.o. Sex: male Date of Birth: [birth year] Admit Date: 04/13/2015 Admitting Physician Edsel Petrin, DO PCP:No PCP Per Patient  Subjective: Continues to improve-Feels better-claims to have more energy.Less erythema in lower extremities Assessment/Plan: Principal Problem: Sepsis secondary to lower extremity cellulitis: Secondary to left more than right lower extremity cellulitis. Now afebrile, Fever curve seems to be improving, leukocytosis has resolved. Blood cultures negative, will discontnue  Zosyn and continue downtrending.Lower extremity Dopplers negative for DVT.   Active Problems: Worsening lower extremity weakness: Suspect secondary to sepsis. However MRI brain shows either a small CVA or a demyelinating changes-Neurology does not think that this is a CVA-no stroke work up recommended. MRI C Spine shows disc prolapse at C3-C4. Will discuss with Neurosurgery-but motor strength is 5/5 in all 4 extremities. Seen by PT-recommendations are for SNF  History of multiple sclerosis: See above-neurology following. If weakness does not improve with treatment of underlying infection/PT evaluation-may need to consider steroids.  History of atrial fibrillation: remains in sinus rhythm.Continue Sotalol, cardizem and Xarelto. Follow QTc.  Hypothyroidism: Continue Synthroid  Dyslipidemia: Continue statins  Scrotal edema/penile edema: Chronic issue per patient-has this for years. Per patient-he does not have any issues with urination. Follow.  Disposition: Remain inpatient- SNF 1/17  Antimicrobial agents  See below  Anti-infectives    Start     Dose/Rate Route Frequency Ordered Stop   04/14/15 0430  vancomycin (VANCOCIN) IVPB 750 mg/150 ml premix  Status:  Discontinued     750 mg 150 mL/hr over 60 Minutes Intravenous Every 12 hours 04/13/15 1623 04/13/15 2332   04/14/15 0030  piperacillin-tazobactam (ZOSYN) IVPB 3.375 g     3.375 g 12.5 mL/hr over 240 Minutes Intravenous 3 times per day 04/13/15 1623     04/14/15 0000  vancomycin (VANCOCIN) IVPB 750 mg/150 ml premix  Status:  Discontinued     750 mg 150 mL/hr over 60 Minutes Intravenous Every 12 hours 04/13/15 2332 04/13/15 2339   04/14/15 0000  vancomycin (VANCOCIN) 1,250 mg in sodium chloride 0.9 % 250 mL IVPB     1,250 mg 166.7 mL/hr over 90 Minutes Intravenous Every 12 hours 04/13/15 2339     04/13/15 2345  vancomycin (VANCOCIN) IVPB 1000 mg/200 mL premix  Status:  Discontinued     1,000 mg 200 mL/hr over 60 Minutes Intravenous STAT 04/13/15 2322 04/13/15 2331   04/13/15 2330  piperacillin-tazobactam (ZOSYN) IVPB 3.375 g  Status:  Discontinued     3.375 g 100 mL/hr over 30 Minutes Intravenous  Once 04/13/15 2322 04/13/15 2330   04/13/15 1530  piperacillin-tazobactam (ZOSYN) IVPB 3.375 g     3.375 g 100 mL/hr over 30 Minutes Intravenous  Once 04/13/15 1528 04/13/15 1614   04/13/15 1530  vancomycin (VANCOCIN) IVPB 1000 mg/200 mL premix     1,000 mg 200 mL/hr over 60 Minutes Intravenous  Once 04/13/15 1528 04/13/15 1719   04/13/15 1530  vancomycin (VANCOCIN) IVPB 1000 mg/200 mL premix  Status:  Discontinued     1,000 mg 200 mL/hr over 60 Minutes Intravenous  Once 04/13/15 1528 04/13/15 2330      DVT Prophylaxis: Xarelto  Code Status: Full code   Family Communication Sister at bedside  Procedures: None  CONSULTS:  neurology  Time spent 25 minutes-Greater than 50% of this time was spent in counseling, explanation of diagnosis, planning of further management, and coordination of care.  MEDICATIONS: Scheduled Meds: . atorvastatin  80 mg Oral q1800  . diltiazem  120 mg Oral Daily  . Dimethyl Fumarate  240 mg Oral BID  . gabapentin  300 mg Oral TID  . levothyroxine  112 mcg Oral QAC breakfast  . LORazepam  0.5 mg Intravenous Once  . multivitamin with minerals  1 tablet Oral Daily  . piperacillin-tazobactam (ZOSYN)  IV  3.375 g Intravenous  3 times per day  . rivaroxaban  20 mg Oral Q supper  . sotalol  40 mg Oral BID  . vancomycin  1,250 mg Intravenous Q12H   Continuous Infusions:   PRN Meds:.acetaminophen **OR** acetaminophen, ondansetron **OR** ondansetron (ZOFRAN) IV, oxyCODONE    PHYSICAL EXAM: Vital signs in last 24 hours: Filed Vitals:   04/16/15 0340 04/16/15 0440 04/16/15 0658 04/16/15 1058  BP: 147/69 145/69  161/79  Pulse: 64 63  70  Temp: 98.7 F (37.1 C) 98.6 F (37 C)    TempSrc: Oral Oral    Resp: 20 19    Height:      Weight:   112.3 kg (247 lb 9.2 oz)   SpO2: 96% 97%      Weight change:  Filed Weights   04/13/15 1446 04/16/15 0658  Weight: 109.77 kg (242 lb) 112.3 kg (247 lb 9.2 oz)   Body mass index is 30.94 kg/(m^2).   Gen Exam: Awake and alert with clear speech.   Neck: Supple, No JVD.   Chest: B/L Clear.   CVS: S1 S2 Regular, no murmurs.  Abdomen: soft, BS +, non tender, non distended.  Extremities: Right leg decreased erythema-left leg with some decrease in erythema as well. Neurologic: Non Focal.   Skin: No Rash.   Wounds: N/A.    Intake/Output from previous day:  Intake/Output Summary (Last 24 hours) at 04/16/15 1404 Last data filed at 04/16/15 0912  Gross per 24 hour  Intake   1380 ml  Output      1 ml  Net   1379 ml     LAB RESULTS: CBC  Recent Labs Lab 04/13/15 1530 04/14/15 0210 04/14/15 0820 04/15/15 0614 04/16/15 0640  WBC 19.1* 15.5* 12.7* 11.1* 5.9  HGB 11.6* 11.2* 11.2* 10.5* 10.2*  HCT 36.9* 35.3* 34.9* 32.7* 33.2*  PLT 233 224 187 168 157  MCV 89.6 88.9 90.2 89.8 90.0  MCH 28.2 28.2 28.9 28.8 27.6  MCHC 31.4 31.7 32.1 32.1 30.7  RDW 14.2 14.3 14.7 14.8 14.3  LYMPHSABS 0.8 0.7  --   --   --   MONOABS 1.3* 0.8  --   --   --   EOSABS 0.1 0.0  --   --   --   BASOSABS 0.1 0.0  --   --   --     Chemistries   Recent Labs Lab 04/13/15 1530 04/14/15 0210 04/14/15 0820 04/15/15 0614 04/16/15 0640  NA 138 137  --  140 140  K 4.0 3.5  --   3.4* 4.0  CL 105 105  --  108 107  CO2 26 24  --  25 25  GLUCOSE 118* 118*  --  88 94  BUN 15 11  --  15 14  CREATININE 0.92 1.03 1.10 1.15 0.95  CALCIUM 8.8* 8.4*  --  8.1* 8.1*    CBG:  Recent Labs Lab 04/16/15 0657  GLUCAP 85    GFR Estimated Creatinine Clearance: 99.2 mL/min (by C-G formula based on Cr of 0.95).  Coagulation profile  Recent  Labs Lab 04/13/15 1530  INR 1.75*    Cardiac Enzymes No results for input(s): CKMB, TROPONINI, MYOGLOBIN in the last 168 hours.  Invalid input(s): CK  Invalid input(s): POCBNP No results for input(s): DDIMER in the last 72 hours.  Recent Labs  04/14/15 0820  HGBA1C 5.3    Recent Labs  04/14/15 0820  CHOL 80  HDL 37*  LDLCALC 33  TRIG 48  CHOLHDL 2.2   No results for input(s): TSH, T4TOTAL, T3FREE, THYROIDAB in the last 72 hours.  Invalid input(s): FREET3 No results for input(s): VITAMINB12, FOLATE, FERRITIN, TIBC, IRON, RETICCTPCT in the last 72 hours. No results for input(s): LIPASE, AMYLASE in the last 72 hours.  Urine Studies No results for input(s): UHGB, CRYS in the last 72 hours.  Invalid input(s): UACOL, UAPR, USPG, UPH, UTP, UGL, UKET, UBIL, UNIT, UROB, ULEU, UEPI, UWBC, URBC, UBAC, CAST, UCOM, BILUA  MICROBIOLOGY: Recent Results (from the past 240 hour(s))  Culture, blood (x 2)     Status: None (Preliminary result)   Collection Time: 04/14/15  1:49 AM  Result Value Ref Range Status   Specimen Description BLOOD RIGHT ANTECUBITAL  Final   Special Requests BOTTLES DRAWN AEROBIC AND ANAEROBIC   Final   Culture NO GROWTH 1 DAY  Final   Report Status PENDING  Incomplete  Culture, blood (x 2)     Status: None (Preliminary result)   Collection Time: 04/14/15  1:55 AM  Result Value Ref Range Status   Specimen Description BLOOD RIGHT ARM  Final   Special Requests BOTTLES DRAWN AEROBIC ONLY  Final   Culture NO GROWTH 1 DAY  Final   Report Status PENDING  Incomplete    RADIOLOGY  STUDIES/RESULTS: Dg Chest 2 View  04/13/2015  CLINICAL DATA:  Fall today with bilateral leg pain and weakness EXAM: CHEST - 2 VIEW COMPARISON:  02/11/2015 FINDINGS: Cardiac shadow remains enlarged. Patient is rotated to the right accentuating the mediastinal markings. No focal infiltrate or sizable effusion is seen. No acute bony abnormality is noted. IMPRESSION: No active disease. Electronically Signed   By: Alcide Clever M.D.   On: 04/13/2015 16:08   Mr Brain Wo Contrast  04/14/2015  CLINICAL DATA:  Initial evaluation for global weakness. History of multiple sclerosis. EXAM: MRI HEAD WITHOUT CONTRAST TECHNIQUE: Multiplanar, multiecho pulse sequences of the brain and surrounding structures were obtained without intravenous contrast. COMPARISON:  Prior CT from 10/01/2014. FINDINGS: Study is limited as the patient was unable to tolerate the full length of the exam. Diffusion-weighted sequences, sagittal T1, axial T2, and axial FLAIR sequences were performed. Diffuse prominence of the CSF containing spaces is compatible with generalized cerebral atrophy. Patchy T2/FLAIR hyperintensity within the periventricular and deep white matter present. While findings may be related to chronic small vessel ischemic in a, multiple sclerosis could also have this appearance given provided history. There are scattered superimposed small 0 chronic lacunar infarcts involving the periventricular white matter as well as the basal ganglia and thalami. Remote lacunar infarct within the pons as well. Diffusion-weighted sequences demonstrates subtle diffusion abnormality within the left aspect of the anterior genu of the corpus callosum (series 5, image 31). This extends superiorly and posteriorly within the left parasagittal region. No significant signal dropout seen on corresponding ADC map. Finding may reflect a small focus of subacute ischemia. Active demyelination could also be considered as there is mildly hyperintense T2/FLAIR  signal intensity associated with this signal abnormality. No other evidence for acute infarct. Major  intracranial vascular flow voids are maintained. No mass lesion, midline shift, or mass effect. Ventricular prominence related to global parenchymal volume loss present without hydrocephalus. No extra-axial fluid collection. Craniocervical junction within normal limits. Degenerative disc bulge noted at C2-3 within the upper cervical spine with associated mild to moderate canal stenosis. Pituitary gland normal.  No acute abnormality about the orbits. Mild mucosal thickening within the ethmoidal air cells. Paranasal sinuses are otherwise clear. No mastoid effusion. Bone marrow signal intensity normal. No scalp soft tissue abnormality. IMPRESSION: 1. Subtle patchy diffusion signal abnormality within the left aspect of the anterior genu of the corpus callosum with extension into the parasagittal left frontal lobe. Finding favored to reflect small foci of subacute ischemia. Possible active demyelination could also be considered given the provided history of multiple sclerosis, although this is less favored. No associated mass effect. 2. Confluent and patchy T2/FLAIR hyperintensity within the periventricular and deep white matter both cerebral hemispheres. Finding favored to in large part reflect sequela of moderate chronic small vessel ischemic changes, although again, demyelinating disease could also have this appearance. 3. Scattered remote lacunar infarcts involving the periventricular white matter of the bilateral corona radiata as well as the basal ganglia, thalami, and pons. Electronically Signed   By: Rise Mu M.D.   On: 04/14/2015 01:25   Mr Cervical Spine Wo Contrast  04/15/2015  CLINICAL DATA:  History of multiple sclerosis with worsening lower extremity weakness bilaterally. Fever and elevated white blood cell count. Initial encounter. EXAM: MRI CERVICAL AND THORACIC SPINE WITHOUT CONTRAST  TECHNIQUE: Multiplanar and multiecho pulse sequences of the cervical spine, to include the craniocervical junction and cervicothoracic junction, and thoracic spine, were obtained without intravenous contrast. COMPARISON:  None. FINDINGS: MRI CERVICAL SPINE FINDINGS The scan toward with and without contrast. Contrast could not be administered as the patient could not tolerate additional scanning. Vertebral body height and alignment are maintained. Marrow signal is mildly heterogeneous but no worrisome marrow lesion is identified. The craniocervical junction is normal. Small foci of T2 hyperintensity in the pons are likely due to chronic ischemic change. Very mild, hazy edema is seen within the cervical cord at C3-4. Cervical cord signal is otherwise unremarkable. Imaged paraspinous structures appear normal. C2-3: A central disc protrusion contacts and mildly deforms the ventral cord. The foramina are open. Facet degenerative change is noted. C3-4: There is a large broad-based central disc protrusion causing severe central canal stenosis and marked flattening of the cord. Uncovertebral disease causes severe bilateral foraminal narrowing. C4-5: There is a shallow disc bulge and central protrusion. The ventral thecal sac is effaced. Mild to moderate foraminal narrowing is seen bilaterally. C5-6: The patient has a disc bulge and right worse than left uncovertebral disease. There is deformity of the ventral cord. Moderate left and moderately severe to severe right foraminal narrowing is present. C6-7: A disc osteophyte complex and uncovertebral disease are seen. There is slight deformity of the ventral cord. Moderate right foraminal narrowing is present. The left foramen is open. C7-T1:  Negative. MRI THORACIC SPINE FINDINGS There is exaggeration of the normal thoracic kyphosis. Vertebral body height, signal and alignment are maintained. The thoracic cord demonstrates normal signal throughout. Imaged paraspinous  structures are unremarkable. T1-2 to T4-5 are negative. T5-6: There is a shallow right paracentral protrusion but the central canal and foramina are open. T6-7: Minimal disc bulge without central canal or foraminal stenosis. T7-8: Minimal disc bulge and facet degenerative change. The central canal and foramina are open. T8-9: Minimal  disc bulge and facet degenerative change. The central canal and foramina are open. T9-10: Shallow disc bulge the right and facet arthropathy without central canal or foraminal stenosis. T10-11: Facet degenerative disease.  Otherwise negative. T11-12: Facet degenerative change.  Otherwise negative. T12-L1:  Facet degenerative disease.  Otherwise negative. IMPRESSION: Negative for evidence of infectious or inflammatory process. No finding to explain the patient's fever and white blood cell count. Very mild, hazy edema within the cervical cord is present at C3-4. While this could be due to multiple sclerosis, it is likely secondary to spondylosis with a broad-based disc bulge causing severe central canal stenosis and marked flattening of the cord identified. The foramina are also severely narrowed at this level. Central disc protrusion at C2-3 contacts and slightly deforms the ventral cord. Deformity of the ventral cord at C5-6 where there is also moderately severe to severe right foraminal narrowing and moderate left foraminal narrowing. Slight deformity of the ventral cord at C6-7 due to a shallow disc bulge. Moderate right foraminal narrowing is present at this level. Mild thoracic spondylosis without notable central canal narrowing. The thoracic cord demonstrates normal signal throughout. Electronically Signed   By: Drusilla Kanner M.D.   On: 04/15/2015 10:49   Mr Thoracic Spine Wo Contrast  04/15/2015  CLINICAL DATA:  History of multiple sclerosis with worsening lower extremity weakness bilaterally. Fever and elevated white blood cell count. Initial encounter. EXAM: MRI CERVICAL  AND THORACIC SPINE WITHOUT CONTRAST TECHNIQUE: Multiplanar and multiecho pulse sequences of the cervical spine, to include the craniocervical junction and cervicothoracic junction, and thoracic spine, were obtained without intravenous contrast. COMPARISON:  None. FINDINGS: MRI CERVICAL SPINE FINDINGS The scan toward with and without contrast. Contrast could not be administered as the patient could not tolerate additional scanning. Vertebral body height and alignment are maintained. Marrow signal is mildly heterogeneous but no worrisome marrow lesion is identified. The craniocervical junction is normal. Small foci of T2 hyperintensity in the pons are likely due to chronic ischemic change. Very mild, hazy edema is seen within the cervical cord at C3-4. Cervical cord signal is otherwise unremarkable. Imaged paraspinous structures appear normal. C2-3: A central disc protrusion contacts and mildly deforms the ventral cord. The foramina are open. Facet degenerative change is noted. C3-4: There is a large broad-based central disc protrusion causing severe central canal stenosis and marked flattening of the cord. Uncovertebral disease causes severe bilateral foraminal narrowing. C4-5: There is a shallow disc bulge and central protrusion. The ventral thecal sac is effaced. Mild to moderate foraminal narrowing is seen bilaterally. C5-6: The patient has a disc bulge and right worse than left uncovertebral disease. There is deformity of the ventral cord. Moderate left and moderately severe to severe right foraminal narrowing is present. C6-7: A disc osteophyte complex and uncovertebral disease are seen. There is slight deformity of the ventral cord. Moderate right foraminal narrowing is present. The left foramen is open. C7-T1:  Negative. MRI THORACIC SPINE FINDINGS There is exaggeration of the normal thoracic kyphosis. Vertebral body height, signal and alignment are maintained. The thoracic cord demonstrates normal signal  throughout. Imaged paraspinous structures are unremarkable. T1-2 to T4-5 are negative. T5-6: There is a shallow right paracentral protrusion but the central canal and foramina are open. T6-7: Minimal disc bulge without central canal or foraminal stenosis. T7-8: Minimal disc bulge and facet degenerative change. The central canal and foramina are open. T8-9: Minimal disc bulge and facet degenerative change. The central canal and foramina are open. T9-10: Shallow  disc bulge the right and facet arthropathy without central canal or foraminal stenosis. T10-11: Facet degenerative disease.  Otherwise negative. T11-12: Facet degenerative change.  Otherwise negative. T12-L1:  Facet degenerative disease.  Otherwise negative. IMPRESSION: Negative for evidence of infectious or inflammatory process. No finding to explain the patient's fever and white blood cell count. Very mild, hazy edema within the cervical cord is present at C3-4. While this could be due to multiple sclerosis, it is likely secondary to spondylosis with a broad-based disc bulge causing severe central canal stenosis and marked flattening of the cord identified. The foramina are also severely narrowed at this level. Central disc protrusion at C2-3 contacts and slightly deforms the ventral cord. Deformity of the ventral cord at C5-6 where there is also moderately severe to severe right foraminal narrowing and moderate left foraminal narrowing. Slight deformity of the ventral cord at C6-7 due to a shallow disc bulge. Moderate right foraminal narrowing is present at this level. Mild thoracic spondylosis without notable central canal narrowing. The thoracic cord demonstrates normal signal throughout. Electronically Signed   By: Drusilla Kanner M.D.   On: 04/15/2015 10:49   Ct Abdomen Pelvis W Contrast  04/14/2015  CLINICAL DATA:  Scrotal swelling.  Increased weakness in the legs. EXAM: CT ABDOMEN AND PELVIS WITH CONTRAST TECHNIQUE: Multidetector CT imaging of the  abdomen and pelvis was performed using the standard protocol following bolus administration of intravenous contrast. CONTRAST:  OMNIPAQUE IOHEXOL 300 MG/ML  SOLN COMPARISON:  None. FINDINGS: Lower chest: Small bilateral pleural effusions. Normal heart size. Coronary artery atherosclerosis in the LAD and circumflex. Hepatobiliary: Normal liver. High-density material in the dependent portion of the gallbladder likely reflecting cholelithiasis versus sludge. Pancreas: Normal. Spleen: Normal. Adrenals/Urinary Tract: Normal adrenal glands. Multiple bilateral hypodense renal masses with the largest in the right kidney measuring 3.1 x 3 cm most consistent with cysts. No urolithiasis or obstructive uropathy. Partially decompressed bladder. Stomach/Bowel: No bowel wall thickening or dilatation. Sigmoid diverticulosis without evidence of diverticulitis. No pneumatosis, pneumoperitoneum or portal venous gas. No abdominal or pelvic free fluid. Vascular/Lymphatic: Normal caliber abdominal aorta with atherosclerosis. No lymphadenopathy. Other: No fluid collection or hematoma. Fat containing left inguinal hernia. Musculoskeletal: No lytic or sclerotic osseous lesion. No acute osseous abnormality. IMPRESSION: 1. Small bilateral pleural effusions. 2. Multi vessel coronary artery atherosclerosis. 3. Sigmoid diverticulosis without evidence of diverticulitis. Electronically Signed   By: Elige Ko   On: 04/14/2015 16:40   Ct Tibia Fibula Left W Contrast  04/14/2015  CLINICAL DATA:  Increasing weakness in the legs and difficulty walking. Multiple sclerosis. Erythema in draining wounds along the legs, possible sepsis/cellulitis. EXAM: CT OF THE LEFT TIBIA AND FIBULA WITH CONTRAST; CT OF THE RIGHT TIBIA AND FIBULA WITH CONTRAST TECHNIQUE: Contiguous axial CT images were obtained through the right lower leg and also through the left lower leg. Dedicated multiplanar reformatting free to leg was performed. CONTRAST:   OMNIPAQUE IOHEXOL 300 MG/ML  SOLN COMPARISON:  None FINDINGS: CT left lower leg: Arterial atherosclerosis noted. No high-grade stenosis in the larger vessels but today's exam was not performed as a CT angiogram. Low grade subcutaneous edema in the distal thigh. No knee effusion. The subcutaneous becomes more confluent in the calf, especially inferiorly, and is generally circumferential. There is atrophy of the calf musculature particularly the posterior compartment, but symmetric to the contralateral side. No abscess or gas tracking in the soft tissues. No tibiotalar joint effusion. No specific supportive evidence of myositis/fasciitis. Most of the  edema appears confined to the subcutaneous tissues. Deeper fatty tissues, for example along Kager's fat pad, do not appear significantly infiltrated. No bony destructive findings characteristic of osteomyelitis. No fracture observed. CT right lower leg: As on the contralateral side, there is low-grade subcutaneous edema circumferentially in the lower thigh which becomes more confluent in the calf region. The degree of subcutaneous edema is symmetric. No abnormal gas tracking within the soft tissues or abscess identified. As compared to the contralateral side, there is slightly more infiltration of Kager's fat pad on the right than the left. The edema is also more confluent tracking in the dorsum of the right foot than the dorsum of the left foot. No significant asymmetry of the musculature. Moderate fatty atrophy of the musculature. No drainable abscess. Symmetric atherosclerosis. No knee or tibiotalar joint effusion. No findings of osteomyelitis by CT. IMPRESSION: 1. The bilateral lower extremity deep edema most confluent in the calves and ankle regions, slightly more prominent on the right side in the dorsum of the foot. On the left side the edema appears relatively confined to the subcutaneous tissues worse on the right side there is some mild infiltration of Kager's  fat pad. No definite evidence of myositis or fasciitis. No gas in the soft tissues. No drainable abscess or findings of osteomyelitis identified in the lower legs. 2. Atherosclerosis. Electronically Signed   By: Gaylyn Rong M.D.   On: 04/14/2015 14:58   Ct Tibia Fibula Right W Contrast  04/14/2015  CLINICAL DATA:  Increasing weakness in the legs and difficulty walking. Multiple sclerosis. Erythema in draining wounds along the legs, possible sepsis/cellulitis. EXAM: CT OF THE LEFT TIBIA AND FIBULA WITH CONTRAST; CT OF THE RIGHT TIBIA AND FIBULA WITH CONTRAST TECHNIQUE: Contiguous axial CT images were obtained through the right lower leg and also through the left lower leg. Dedicated multiplanar reformatting free to leg was performed. CONTRAST:  OMNIPAQUE IOHEXOL 300 MG/ML  SOLN COMPARISON:  None FINDINGS: CT left lower leg: Arterial atherosclerosis noted. No high-grade stenosis in the larger vessels but today's exam was not performed as a CT angiogram. Low grade subcutaneous edema in the distal thigh. No knee effusion. The subcutaneous becomes more confluent in the calf, especially inferiorly, and is generally circumferential. There is atrophy of the calf musculature particularly the posterior compartment, but symmetric to the contralateral side. No abscess or gas tracking in the soft tissues. No tibiotalar joint effusion. No specific supportive evidence of myositis/fasciitis. Most of the edema appears confined to the subcutaneous tissues. Deeper fatty tissues, for example along Kager's fat pad, do not appear significantly infiltrated. No bony destructive findings characteristic of osteomyelitis. No fracture observed. CT right lower leg: As on the contralateral side, there is low-grade subcutaneous edema circumferentially in the lower thigh which becomes more confluent in the calf region. The degree of subcutaneous edema is symmetric. No abnormal gas tracking within the soft tissues or abscess  identified. As compared to the contralateral side, there is slightly more infiltration of Kager's fat pad on the right than the left. The edema is also more confluent tracking in the dorsum of the right foot than the dorsum of the left foot. No significant asymmetry of the musculature. Moderate fatty atrophy of the musculature. No drainable abscess. Symmetric atherosclerosis. No knee or tibiotalar joint effusion. No findings of osteomyelitis by CT. IMPRESSION: 1. The bilateral lower extremity deep edema most confluent in the calves and ankle regions, slightly more prominent on the right side in the dorsum of  the foot. On the left side the edema appears relatively confined to the subcutaneous tissues worse on the right side there is some mild infiltration of Kager's fat pad. No definite evidence of myositis or fasciitis. No gas in the soft tissues. No drainable abscess or findings of osteomyelitis identified in the lower legs. 2. Atherosclerosis. Electronically Signed   By: Gaylyn Rong M.D.   On: 04/14/2015 14:58    Jeoffrey Massed, MD  [facility] Pager:[phone]  If 7PM-7AM, please contact night-coverage [email or web address] Password [email or web address] 04/16/2015, 2:04 PM   LOS: 3 days

## 2015-04-16 NOTE — Progress Notes (Signed)
  Echocardiogram 2D Echocardiogram has been performed.  Delcie Roch 04/16/2015, 3:20 PM

## 2015-04-16 NOTE — Progress Notes (Signed)
ANTIBIOTIC CONSULT NOTE - Follow up Pharmacy Consult for vanc/zosyn Indication:  Bilateral LE cellulitis  No Known Allergies  Patient Measurements: Height: 6\' 3"  (190.5 cm) Weight: 247 lb 9.2 oz (112.3 kg) IBW/kg (Calculated) : 84.5 Adjusted Body Weight:   Vital Signs: Temp: 98.6 F (37 C) (01/14 0440) Temp Source: Oral (01/14 0440) BP: 161/79 mmHg (01/14 1058) Pulse Rate: 70 (01/14 1058) Intake/Output from previous day: 01/13 0701 - 01/14 0700 In: 1500 [P.O.:600; IV Piggyback:900] Out: 1 [Stool:1] Intake/Output from this shift: Total I/O In: 240 [P.O.:240] Out: -   Labs:  Recent Labs  04/14/15 0820 04/15/15 0614 04/16/15 0640  WBC 12.7* 11.1* 5.9  HGB 11.2* 10.5* 10.2*  PLT 187 168 157  CREATININE 1.10 1.15 0.95   Estimated Creatinine Clearance: 99.2 mL/min (by C-G formula based on Cr of 0.95).  Recent Labs  04/16/15 1243  VANCOTROUGH 13     Microbiology: Recent Results (from the past 720 hour(s))  Culture, blood (x 2)     Status: None (Preliminary result)   Collection Time: 04/14/15  1:49 AM  Result Value Ref Range Status   Specimen Description BLOOD RIGHT ANTECUBITAL  Final   Special Requests BOTTLES DRAWN AEROBIC AND ANAEROBIC   Final   Culture NO GROWTH 1 DAY  Final   Report Status PENDING  Incomplete  Culture, blood (x 2)     Status: None (Preliminary result)   Collection Time: 04/14/15  1:55 AM  Result Value Ref Range Status   Specimen Description BLOOD RIGHT ARM  Final   Special Requests BOTTLES DRAWN AEROBIC ONLY  Final   Culture NO GROWTH 1 DAY  Final   Report Status PENDING  Incomplete    Medical History: Past Medical History  Diagnosis Date  . Hypertension   . GERD (gastroesophageal reflux disease)   . Hypercholesteremia   . Thyroid activity decreased   . Multiple sclerosis (HCC)   . Murmur, heart     Assessment: 70 yo male with h/o multiple sclerosis (other PMH as noted above) admitted 1/11 with chronic weaknesss and  chronic nonhealing wounds in bilateral lower extremitiesa and fever.  Empiric antibiotics started on admit 04/13/15.  Vanc/Zosyn for bilateral LE cellulitis. WBC improving on abx.Fever curve seems to be improving Tc 99.6, TM 99.6.  Leukocytosis trending down, WBC 15.5 > 11.1>5.9 Renal function stable  SCr 0.95, CrCl ~87ml/min Hgb 11.1, pltc 168L,  Blood cultures negative so far. VT =13 on  q12h, VT is therapeutic on current dose 1250 mg IV q12h.   Vanc 1/11 > Zosyn 1/ 11 >  1/14: Vanc TROUGH = 13 (on  IV q12h)  Goal VT 10-15  1/11 Urine cx not sent 1/12 blood x 2 ngtd    Goal of Therapy:  Vancomycin trough level 10-15 mcg/ml  Plan:  Continue  Vancomycin to  IV q12h  Continue Zosyn 3.375g IV q8h (4h infusion each dose) Monitor clinical progress, c/s, renal function, abx plan/LOT VT@SS  as ndicated  Noah Delaine, RPh Clinical Pharmacist Pager: [phone] 04/16/2015 1:48 PM

## 2015-04-17 ENCOUNTER — Encounter (HOSPITAL_COMMUNITY): Payer: Self-pay | Admitting: Radiology

## 2015-04-17 ENCOUNTER — Inpatient Hospital Stay (HOSPITAL_COMMUNITY): Payer: Medicare Other

## 2015-04-17 LAB — BASIC METABOLIC PANEL
Anion gap: 9 (ref 5–15)
BUN: 10 mg/dL (ref 6–20)
CHLORIDE: 107 mmol/L (ref 101–111)
CO2: 25 mmol/L (ref 22–32)
Calcium: 8.3 mg/dL — ABNORMAL LOW (ref 8.9–10.3)
Creatinine, Ser: 0.84 mg/dL (ref 0.61–1.24)
GFR calc Af Amer: >60 mL/min (ref >60)
GFR calc non Af Amer: >60 mL/min (ref >60)
GLUCOSE: 102 mg/dL — AB (ref 65–99)
POTASSIUM: 3.2 mmol/L — AB (ref 3.5–5.1)
Sodium: 141 mmol/L (ref 135–145)

## 2015-04-17 MED ORDER — POTASSIUM CHLORIDE CRYS ER 20 MEQ PO TBCR
40.0000 meq | EXTENDED_RELEASE_TABLET | Freq: Once | ORAL | Status: AC
  Administered 2015-04-17: 40 meq via ORAL
  Filled 2015-04-17: qty 2

## 2015-04-17 MED ORDER — POTASSIUM CHLORIDE CRYS ER 20 MEQ PO TBCR
20.0000 meq | EXTENDED_RELEASE_TABLET | Freq: Every day | ORAL | Status: DC
  Administered 2015-04-18: 20 meq via ORAL
  Filled 2015-04-17: qty 1

## 2015-04-17 MED ORDER — IOHEXOL 350 MG/ML SOLN
100.0000 mL | Freq: Once | INTRAVENOUS | Status: AC | PRN
  Administered 2015-04-17: 75 mL via INTRAVENOUS

## 2015-04-17 MED ORDER — FUROSEMIDE 20 MG PO TABS
20.0000 mg | ORAL_TABLET | Freq: Every day | ORAL | Status: DC
  Administered 2015-04-17 – 2015-04-18 (×2): 20 mg via ORAL
  Filled 2015-04-17 (×2): qty 1

## 2015-04-17 NOTE — Clinical Social Work Placement (Signed)
   CLINICAL SOCIAL WORK PLACEMENT  NOTE  Date:  04/17/2015  Patient Details  Name: Martin Powell MRN: [id number] Date of Birth: September 12, 1945  Clinical Social Work is seeking post-discharge placement for this patient at the Skilled  Nursing Facility level of care (*CSW will initial, date and re-position this form in  chart as items are completed):  Yes   Patient/family provided with [facility] Clinical Social Work Department's list of facilities offering this level of care within the geographic area requested by the patient (or if unable, by the patient's family).  Yes   Patient/family informed of their freedom to choose among providers that offer the needed level of care, that participate in Medicare, Medicaid or managed care program needed by the patient, have an available bed and are willing to accept the patient.  Yes   Patient/family informed of [facility]'s ownership interest in [facility] and [facility], as well as of the fact that they are under no obligation to receive care at these facilities.  PASRR submitted to EDS on 04/17/15     PASRR number received on 04/17/15     Existing PASRR number confirmed on       FL2 transmitted to all facilities in geographic area requested by pt/family on 04/17/15     FL2 transmitted to all facilities within larger geographic area on       Patient informed that his/her managed care company has contracts with or will negotiate with certain facilities, including the following:            Patient/family informed of bed offers received.  Patient chooses bed at       Physician recommends and patient chooses bed at      Patient to be transferred to   on  .  Patient to be transferred to facility by       Patient family notified on   of transfer.  Name of family member notified:        PHYSICIAN Please sign FL2     Additional Comment:    _______________________________________________ Darleene Cleaver, LCSWA 04/17/2015,  10:35 AM

## 2015-04-17 NOTE — NC FL2 (Signed)
[place] MEDICAID FL2 LEVEL OF CARE SCREENING TOOL     IDENTIFICATION  Patient Name: Martin Powell Birthdate: 10-18-1945 Sex: male Admission Date (Current Location): 04/13/2015  [facility] and [place] Number:  Producer, television/film/video and Address:  [facility]. [facility], [address], [place], [place] [address]      Provider Number: [id number]  Attending Physician Name and Address:  Maretta Bees, MD  Relative Name and Phone Number:  Roosvelt Harps, Sister [phone]    Current Level of Care: Hospital Recommended Level of Care: Skilled Nursing Facility Prior Approval Number:    Date Approved/Denied:   PASRR Number: [id number]  Discharge Plan: SNF    Current Diagnoses: Patient Active Problem List   Diagnosis Date Noted  . Cellulitis LOWER EXTREMITIES. 04/13/2015  . Sepsis (HCC) 04/13/2015  . Fracture of right inferior pubic ramus (HCC) 10/06/2014  . Physical deconditioning 10/05/2014  . Pressure ulcer 10/03/2014  . Enteritis due to Clostridium difficile 10/03/2014  . Pelvic fracture (HCC) 10/03/2014  . Multiple sclerosis (HCC) 10/01/2014  . Hypercholesteremia 10/01/2014  . Hypotension 09/30/2014  . Dehydration 09/30/2014  . Hypothyroidism 09/30/2014  . Hyperlipidemia 09/30/2014    Orientation RESPIRATION BLADDER Height & Weight    Self, Time, Situation, Place  Normal Incontinent  (190.5 cm) 251 lbs.  BEHAVIORAL SYMPTOMS/MOOD NEUROLOGICAL BOWEL NUTRITION STATUS      Continent Diet (Cardiac Diet)  AMBULATORY STATUS COMMUNICATION OF NEEDS Skin   Limited Assist Verbally Surgical wounds                       Personal Care Assistance Level of Assistance  Bathing Bathing Assistance: Limited assistance         Functional Limitations Info             SPECIAL CARE FACTORS FREQUENCY  PT (By licensed PT)     PT Frequency: 5x a week              Contractures      Additional Factors Info  Code Status, Allergies Code  Status Info: Full Allergies Info: None           Current Medications (04/17/2015):  This is the current hospital active medication list Current Facility-Administered Medications  Medication Dose Route Frequency Provider Last Rate Last Dose  . acetaminophen (TYLENOL) tablet 650 mg  650 mg Oral Q6H PRN Eduard Clos, MD   650 mg at 04/16/15 2350   Or  . acetaminophen (TYLENOL) suppository 650 mg  650 mg Rectal Q6H PRN Eduard Clos, MD      . atorvastatin (LIPITOR) tablet 80 mg  80 mg Oral q1800 Eduard Clos, MD   80 mg at 04/16/15 1817  . diltiazem (TIAZAC) 24 hr capsule 120 mg  120 mg Oral Daily Maretta Bees, MD   120 mg at 04/16/15 1059  . Dimethyl Fumarate CPDR 240 mg  240 mg Oral BID Eduard Clos, MD   240 mg at 04/16/15 2350  . gabapentin (NEURONTIN) capsule 300 mg  300 mg Oral TID Eduard Clos, MD   300 mg at 04/16/15 2126  . levothyroxine (SYNTHROID, LEVOTHROID) tablet 112 mcg  112 mcg Oral QAC breakfast Tamera Reason, RPH   112 mcg at 04/16/15 0848  . LORazepam (ATIVAN) injection 0.5 mg  0.5 mg Intravenous Once Leda Gauze, NP   0.5 mg at 04/14/15 2100  . multivitamin with minerals tablet 1 tablet  1  tablet Oral Daily Stana Bunting, RD   1 tablet at 04/16/15 1059  . ondansetron (ZOFRAN) tablet 4 mg  4 mg Oral Q6H PRN Eduard Clos, MD       Or  . ondansetron [facility]) injection 4 mg  4 mg Intravenous Q6H PRN Eduard Clos, MD      . oxyCODONE (Oxy IR/ROXICODONE) immediate release tablet 5 mg  5 mg Oral Q6H PRN Maretta Bees, MD   5 mg at 04/16/15 2126  . potassium chloride SA (K-DUR,KLOR-CON) CR tablet 40 mEq  40 mEq Oral Once Maretta Bees, MD      . rivaroxaban Carlena Hurl) tablet 20 mg  20 mg Oral Q supper Bobette Mo, MD   20 mg at 04/16/15 1659  . sotalol (BETAPACE) tablet 40 mg  40 mg Oral BID Maretta Bees, MD   40 mg at 04/16/15 2350  . vancomycin (VANCOCIN) 1,250 mg in sodium chloride 0.9 % 250 mL  IVPB  1,250 mg Intravenous Q12H Titus Mould, RPH   1,250 mg at 04/16/15 2355     Discharge Medications: Please see discharge summary for a list of discharge medications.  Relevant Imaging Results:  Relevant Lab Results:   Additional Information    Jameriah Trotti, Ervin Knack, LCSWA

## 2015-04-17 NOTE — Progress Notes (Signed)
PATIENT DETAILS Name: Westley Blass Age: 70 y.o. Sex: male Date of Birth: [birth year] Admit Date: 04/13/2015 Admitting Physician Edsel Petrin, DO PCP:No PCP Per Patient  Subjective: Less erythema in lower extremities  Assessment/Plan: Principal Problem: Sepsis secondary to lower extremity cellulitis: Secondary to left more than right lower extremity cellulitis. Now afebrile, Fever curve seems to be improving, leukocytosis has resolved. Blood cultures negative, continue vancomycin..Lower extremity Dopplers negative for DVT.   Active Problems: Worsening lower extremity weakness: Suspect secondary to sepsis. However MRI brain shows either a small CVA or a demyelinating changes-Neurology does not think that this is a CVA-no stroke work up recommended. MRI C Spine shows disc prolapse at C3-C4. Discussed with Neurosurgery-Dr Jeral Fruit over the phone on 1/14-reviewed images-suggested a Aspen collar and outpatient follow-up with him. He recommended surgery when more stable.  Seen by PT-recommendations are for SNF  Severe pulmonary hypertension: Echocardiogram on 1/14 showed PA pressure of 78 mmHg. CT angio chest negative for pulmonary embolism. This is more of an incidental finding, stable for further workup to be done in the outpatient setting.  History of multiple sclerosis: Continue Dimethyl Fumarate.See above-neurology consulted-did not feel as if the patient requires IV Solu-Medrol as clinically improved.  History of atrial fibrillation: remains in sinus rhythm.Continue Sotalol, cardizem and Xarelto. Follow QTc periodically  Hypothyroidism: Continue Synthroid  Dyslipidemia: Continue statins  Scrotal edema/penile edema: Chronic issue per patient-has this for years. Per patient-he does not have any issues with urination. Follow.  Disposition: Remain inpatient- SNF 1/17  Antimicrobial agents  See below  Anti-infectives    Start     Dose/Rate Route Frequency Ordered Stop    04/14/15 0430  vancomycin (VANCOCIN) IVPB 750 mg/150 ml premix  Status:  Discontinued     750 mg 150 mL/hr over 60 Minutes Intravenous Every 12 hours 04/13/15 1623 04/13/15 2332   04/14/15 0030  piperacillin-tazobactam (ZOSYN) IVPB 3.375 g  Status:  Discontinued     3.375 g 12.5 mL/hr over 240 Minutes Intravenous 3 times per day 04/13/15 1623 04/16/15 1412   04/14/15 0000  vancomycin (VANCOCIN) IVPB 750 mg/150 ml premix  Status:  Discontinued     750 mg 150 mL/hr over 60 Minutes Intravenous Every 12 hours 04/13/15 2332 04/13/15 2339   04/14/15 0000  vancomycin (VANCOCIN) 1,250 mg in sodium chloride 0.9 % 250 mL IVPB     1,250 mg 166.7 mL/hr over 90 Minutes Intravenous Every 12 hours 04/13/15 2339     04/13/15 2345  vancomycin (VANCOCIN) IVPB 1000 mg/200 mL premix  Status:  Discontinued     1,000 mg 200 mL/hr over 60 Minutes Intravenous STAT 04/13/15 2322 04/13/15 2331   04/13/15 2330  piperacillin-tazobactam (ZOSYN) IVPB 3.375 g  Status:  Discontinued     3.375 g 100 mL/hr over 30 Minutes Intravenous  Once 04/13/15 2322 04/13/15 2330   04/13/15 1530  piperacillin-tazobactam (ZOSYN) IVPB 3.375 g     3.375 g 100 mL/hr over 30 Minutes Intravenous  Once 04/13/15 1528 04/13/15 1614   04/13/15 1530  vancomycin (VANCOCIN) IVPB 1000 mg/200 mL premix     1,000 mg 200 mL/hr over 60 Minutes Intravenous  Once 04/13/15 1528 04/13/15 1719   04/13/15 1530  vancomycin (VANCOCIN) IVPB 1000 mg/200 mL premix  Status:  Discontinued     1,000 mg 200 mL/hr over 60 Minutes Intravenous  Once 04/13/15 1528 04/13/15 2330      DVT Prophylaxis: Xarelto  Code Status: Full code   Family Communication Sister at bedside  Procedures: None  CONSULTS:  neurology  Time spent 25 minutes-Greater than 50% of this time was spent in counseling, explanation of diagnosis, planning of further management, and coordination of care.  MEDICATIONS: Scheduled Meds: . atorvastatin  80 mg Oral q1800  . diltiazem   120 mg Oral Daily  . Dimethyl Fumarate  240 mg Oral BID  . gabapentin  300 mg Oral TID  . levothyroxine  112 mcg Oral QAC breakfast  . LORazepam  0.5 mg Intravenous Once  . multivitamin with minerals  1 tablet Oral Daily  . rivaroxaban  20 mg Oral Q supper  . sotalol  40 mg Oral BID  . vancomycin  1,250 mg Intravenous Q12H   Continuous Infusions:   PRN Meds:.acetaminophen **OR** acetaminophen, ondansetron **OR** ondansetron (ZOFRAN) IV, oxyCODONE    PHYSICAL EXAM: Vital signs in last 24 hours: Filed Vitals:   04/16/15 2017 04/17/15 0003 04/17/15 0434 04/17/15 0911  BP: 150/67 161/74 157/88 165/81  Pulse: 60 60 62 64  Temp: 98.5 F (36.9 C) 98.2 F (36.8 C) 98.4 F (36.9 C) 98.9 F (37.2 C)  TempSrc: Oral Oral Oral Oral  Resp: 18 16 16 18   Height:      Weight:   114.2 kg (251 lb 12.3 oz)   SpO2: 98% 94% 96% 98%    Weight change: 1.9 kg (4 lb 3 oz) Filed Weights   04/13/15 1446 04/16/15 0658 04/17/15 0434  Weight: 109.77 kg (242 lb) 112.3 kg (247 lb 9.2 oz) 114.2 kg (251 lb 12.3 oz)   Body mass index is 31.47 kg/(m^2).   Gen Exam: Awake and alert with clear speech.   Neck: Supple, No JVD.   Chest: B/L Clear.   CVS: S1 S2 Regular, no murmurs.  Abdomen: soft, BS +, non tender, non distended.  Extremities: Right leg decreased erythema-left leg with some decrease in erythema as well. Neurologic: Non Focal.   Skin: No Rash.   Wounds: N/A.    Intake/Output from previous day:  Intake/Output Summary (Last 24 hours) at 04/17/15 1318 Last data filed at 04/17/15 1021  Gross per 24 hour  Intake   1030 ml  Output   1025 ml  Net      5 ml     LAB RESULTS: CBC  Recent Labs Lab 04/13/15 1530 04/14/15 0210 04/14/15 0820 04/15/15 0614 04/16/15 0640  WBC 19.1* 15.5* 12.7* 11.1* 5.9  HGB 11.6* 11.2* 11.2* 10.5* 10.2*  HCT 36.9* 35.3* 34.9* 32.7* 33.2*  PLT 233 224 187 168 157  MCV 89.6 88.9 90.2 89.8 90.0  MCH 28.2 28.2 28.9 28.8 27.6  MCHC 31.4 31.7 32.1 32.1  30.7  RDW 14.2 14.3 14.7 14.8 14.3  LYMPHSABS 0.8 0.7  --   --   --   MONOABS 1.3* 0.8  --   --   --   EOSABS 0.1 0.0  --   --   --   BASOSABS 0.1 0.0  --   --   --     Chemistries   Recent Labs Lab 04/13/15 1530 04/14/15 0210 04/14/15 0820 04/15/15 0614 04/16/15 0640 04/17/15 0805  NA 138 137  --  140 140 141  K 4.0 3.5  --  3.4* 4.0 3.2*  CL 105 105  --  108 107 107  CO2 26 24  --  25 25 25   GLUCOSE 118* 118*  --  88 94 102*  BUN 15 11  --  15 14 10   CREATININE 0.92 1.03 1.10 1.15 0.95 0.84  CALCIUM 8.8* 8.4*  --  8.1* 8.1* 8.3*    CBG:  Recent Labs Lab 04/16/15 0657  GLUCAP 85    GFR Estimated Creatinine Clearance: 113.2 mL/min (by C-G formula based on Cr of 0.84).  Coagulation profile  Recent Labs Lab 04/13/15 1530  INR 1.75*    Cardiac Enzymes No results for input(s): CKMB, TROPONINI, MYOGLOBIN in the last 168 hours.  Invalid input(s): CK  Invalid input(s): POCBNP No results for input(s): DDIMER in the last 72 hours. No results for input(s): HGBA1C in the last 72 hours. No results for input(s): CHOL, HDL, LDLCALC, TRIG, CHOLHDL, LDLDIRECT in the last 72 hours. No results for input(s): TSH, T4TOTAL, T3FREE, THYROIDAB in the last 72 hours.  Invalid input(s): FREET3 No results for input(s): VITAMINB12, FOLATE, FERRITIN, TIBC, IRON, RETICCTPCT in the last 72 hours. No results for input(s): LIPASE, AMYLASE in the last 72 hours.  Urine Studies No results for input(s): UHGB, CRYS in the last 72 hours.  Invalid input(s): UACOL, UAPR, USPG, UPH, UTP, UGL, UKET, UBIL, UNIT, UROB, ULEU, UEPI, UWBC, URBC, UBAC, CAST, UCOM, BILUA  MICROBIOLOGY: Recent Results (from the past 240 hour(s))  Culture, blood (x 2)     Status: None (Preliminary result)   Collection Time: 04/14/15  1:49 AM  Result Value Ref Range Status   Specimen Description BLOOD RIGHT ANTECUBITAL  Final   Special Requests BOTTLES DRAWN AEROBIC AND ANAEROBIC   Final   Culture NO GROWTH  2 DAYS  Final   Report Status PENDING  Incomplete  Culture, blood (x 2)     Status: None (Preliminary result)   Collection Time: 04/14/15  1:55 AM  Result Value Ref Range Status   Specimen Description BLOOD RIGHT ARM  Final   Special Requests BOTTLES DRAWN AEROBIC ONLY  Final   Culture NO GROWTH 2 DAYS  Final   Report Status PENDING  Incomplete  Urine culture     Status: None (Preliminary result)   Collection Time: 04/16/15  6:50 PM  Result Value Ref Range Status   Specimen Description URINE, RANDOM  Final   Special Requests NONE  Final   Culture NO GROWTH < 24 HOURS  Final   Report Status PENDING  Incomplete    RADIOLOGY STUDIES/RESULTS: Dg Chest 2 View  04/13/2015  CLINICAL DATA:  Fall today with bilateral leg pain and weakness EXAM: CHEST - 2 VIEW COMPARISON:  02/11/2015 FINDINGS: Cardiac shadow remains enlarged. Patient is rotated to the right accentuating the mediastinal markings. No focal infiltrate or sizable effusion is seen. No acute bony abnormality is noted. IMPRESSION: No active disease. Electronically Signed   By: Alcide Clever M.D.   On: 04/13/2015 16:08   Ct Angio Chest Pe W/cm &/or Wo Cm  04/17/2015  CLINICAL DATA:  Worsening chest pain and shortness of breath over past month. Lower extremity swelling. Pulmonary hypertension. Clinical suspicion for pulmonary embolism. EXAM: CT ANGIOGRAPHY CHEST WITH CONTRAST TECHNIQUE: Multidetector CT imaging of the chest was performed using the standard protocol during bolus administration of intravenous contrast. Multiplanar CT image reconstructions and MIPs were obtained to evaluate the vascular anatomy. CONTRAST:  49mL OMNIPAQUE IOHEXOL 350 MG/ML SOLN COMPARISON:  None. FINDINGS: Mediastinum/Lymph Nodes: No pulmonary emboli or thoracic aortic dissection identified. Moderate cardiomegaly and coronary artery calcification noted. No evidence of pericardial effusion. No masses or pathologically enlarged lymph nodes identified. Lungs/Pleura:  Small pleural effusions are seen bilaterally. There is  also mild patchy ground-glass opacity seen bilaterally, suspicious for mild pulmonary edema. No evidence pulmonary consolidation or mass. Upper abdomen: Small cholesterol gallstones incidentally noted, without evidence of cholecystitis. Musculoskeletal: No chest wall mass or suspicious bone lesions identified. Review of the MIP images confirms the above findings. IMPRESSION: No evidence of pulmonary embolism. Cardiomegaly, small bilateral pleural effusions, and patchy ground-glass pulmonary opacity suspicious for mild edema/congestive heart failure. Cholelithiasis.  No radiographic evidence of cholecystitis. Electronically Signed   By: Myles Rosenthal M.D.   On: 04/17/2015 13:02   Mr Brain Wo Contrast  04/14/2015  CLINICAL DATA:  Initial evaluation for global weakness. History of multiple sclerosis. EXAM: MRI HEAD WITHOUT CONTRAST TECHNIQUE: Multiplanar, multiecho pulse sequences of the brain and surrounding structures were obtained without intravenous contrast. COMPARISON:  Prior CT from 10/01/2014. FINDINGS: Study is limited as the patient was unable to tolerate the full length of the exam. Diffusion-weighted sequences, sagittal T1, axial T2, and axial FLAIR sequences were performed. Diffuse prominence of the CSF containing spaces is compatible with generalized cerebral atrophy. Patchy T2/FLAIR hyperintensity within the periventricular and deep white matter present. While findings may be related to chronic small vessel ischemic in a, multiple sclerosis could also have this appearance given provided history. There are scattered superimposed small 0 chronic lacunar infarcts involving the periventricular white matter as well as the basal ganglia and thalami. Remote lacunar infarct within the pons as well. Diffusion-weighted sequences demonstrates subtle diffusion abnormality within the left aspect of the anterior genu of the corpus callosum (series 5, image 31).  This extends superiorly and posteriorly within the left parasagittal region. No significant signal dropout seen on corresponding ADC map. Finding may reflect a small focus of subacute ischemia. Active demyelination could also be considered as there is mildly hyperintense T2/FLAIR signal intensity associated with this signal abnormality. No other evidence for acute infarct. Major intracranial vascular flow voids are maintained. No mass lesion, midline shift, or mass effect. Ventricular prominence related to global parenchymal volume loss present without hydrocephalus. No extra-axial fluid collection. Craniocervical junction within normal limits. Degenerative disc bulge noted at C2-3 within the upper cervical spine with associated mild to moderate canal stenosis. Pituitary gland normal.  No acute abnormality about the orbits. Mild mucosal thickening within the ethmoidal air cells. Paranasal sinuses are otherwise clear. No mastoid effusion. Bone marrow signal intensity normal. No scalp soft tissue abnormality. IMPRESSION: 1. Subtle patchy diffusion signal abnormality within the left aspect of the anterior genu of the corpus callosum with extension into the parasagittal left frontal lobe. Finding favored to reflect small foci of subacute ischemia. Possible active demyelination could also be considered given the provided history of multiple sclerosis, although this is less favored. No associated mass effect. 2. Confluent and patchy T2/FLAIR hyperintensity within the periventricular and deep white matter both cerebral hemispheres. Finding favored to in large part reflect sequela of moderate chronic small vessel ischemic changes, although again, demyelinating disease could also have this appearance. 3. Scattered remote lacunar infarcts involving the periventricular white matter of the bilateral corona radiata as well as the basal ganglia, thalami, and pons. Electronically Signed   By: Rise Mu M.D.   On:  04/14/2015 01:25   Mr Cervical Spine Wo Contrast  04/15/2015  CLINICAL DATA:  History of multiple sclerosis with worsening lower extremity weakness bilaterally. Fever and elevated white blood cell count. Initial encounter. EXAM: MRI CERVICAL AND THORACIC SPINE WITHOUT CONTRAST TECHNIQUE: Multiplanar and multiecho pulse sequences of the cervical spine, to include the  craniocervical junction and cervicothoracic junction, and thoracic spine, were obtained without intravenous contrast. COMPARISON:  None. FINDINGS: MRI CERVICAL SPINE FINDINGS The scan toward with and without contrast. Contrast could not be administered as the patient could not tolerate additional scanning. Vertebral body height and alignment are maintained. Marrow signal is mildly heterogeneous but no worrisome marrow lesion is identified. The craniocervical junction is normal. Small foci of T2 hyperintensity in the pons are likely due to chronic ischemic change. Very mild, hazy edema is seen within the cervical cord at C3-4. Cervical cord signal is otherwise unremarkable. Imaged paraspinous structures appear normal. C2-3: A central disc protrusion contacts and mildly deforms the ventral cord. The foramina are open. Facet degenerative change is noted. C3-4: There is a large broad-based central disc protrusion causing severe central canal stenosis and marked flattening of the cord. Uncovertebral disease causes severe bilateral foraminal narrowing. C4-5: There is a shallow disc bulge and central protrusion. The ventral thecal sac is effaced. Mild to moderate foraminal narrowing is seen bilaterally. C5-6: The patient has a disc bulge and right worse than left uncovertebral disease. There is deformity of the ventral cord. Moderate left and moderately severe to severe right foraminal narrowing is present. C6-7: A disc osteophyte complex and uncovertebral disease are seen. There is slight deformity of the ventral cord. Moderate right foraminal narrowing is  present. The left foramen is open. C7-T1:  Negative. MRI THORACIC SPINE FINDINGS There is exaggeration of the normal thoracic kyphosis. Vertebral body height, signal and alignment are maintained. The thoracic cord demonstrates normal signal throughout. Imaged paraspinous structures are unremarkable. T1-2 to T4-5 are negative. T5-6: There is a shallow right paracentral protrusion but the central canal and foramina are open. T6-7: Minimal disc bulge without central canal or foraminal stenosis. T7-8: Minimal disc bulge and facet degenerative change. The central canal and foramina are open. T8-9: Minimal disc bulge and facet degenerative change. The central canal and foramina are open. T9-10: Shallow disc bulge the right and facet arthropathy without central canal or foraminal stenosis. T10-11: Facet degenerative disease.  Otherwise negative. T11-12: Facet degenerative change.  Otherwise negative. T12-L1:  Facet degenerative disease.  Otherwise negative. IMPRESSION: Negative for evidence of infectious or inflammatory process. No finding to explain the patient's fever and white blood cell count. Very mild, hazy edema within the cervical cord is present at C3-4. While this could be due to multiple sclerosis, it is likely secondary to spondylosis with a broad-based disc bulge causing severe central canal stenosis and marked flattening of the cord identified. The foramina are also severely narrowed at this level. Central disc protrusion at C2-3 contacts and slightly deforms the ventral cord. Deformity of the ventral cord at C5-6 where there is also moderately severe to severe right foraminal narrowing and moderate left foraminal narrowing. Slight deformity of the ventral cord at C6-7 due to a shallow disc bulge. Moderate right foraminal narrowing is present at this level. Mild thoracic spondylosis without notable central canal narrowing. The thoracic cord demonstrates normal signal throughout. Electronically Signed   By:  Drusilla Kanner M.D.   On: 04/15/2015 10:49   Mr Thoracic Spine Wo Contrast  04/15/2015  CLINICAL DATA:  History of multiple sclerosis with worsening lower extremity weakness bilaterally. Fever and elevated white blood cell count. Initial encounter. EXAM: MRI CERVICAL AND THORACIC SPINE WITHOUT CONTRAST TECHNIQUE: Multiplanar and multiecho pulse sequences of the cervical spine, to include the craniocervical junction and cervicothoracic junction, and thoracic spine, were obtained without intravenous contrast. COMPARISON:  None. FINDINGS: MRI CERVICAL SPINE FINDINGS The scan toward with and without contrast. Contrast could not be administered as the patient could not tolerate additional scanning. Vertebral body height and alignment are maintained. Marrow signal is mildly heterogeneous but no worrisome marrow lesion is identified. The craniocervical junction is normal. Small foci of T2 hyperintensity in the pons are likely due to chronic ischemic change. Very mild, hazy edema is seen within the cervical cord at C3-4. Cervical cord signal is otherwise unremarkable. Imaged paraspinous structures appear normal. C2-3: A central disc protrusion contacts and mildly deforms the ventral cord. The foramina are open. Facet degenerative change is noted. C3-4: There is a large broad-based central disc protrusion causing severe central canal stenosis and marked flattening of the cord. Uncovertebral disease causes severe bilateral foraminal narrowing. C4-5: There is a shallow disc bulge and central protrusion. The ventral thecal sac is effaced. Mild to moderate foraminal narrowing is seen bilaterally. C5-6: The patient has a disc bulge and right worse than left uncovertebral disease. There is deformity of the ventral cord. Moderate left and moderately severe to severe right foraminal narrowing is present. C6-7: A disc osteophyte complex and uncovertebral disease are seen. There is slight deformity of the ventral cord. Moderate  right foraminal narrowing is present. The left foramen is open. C7-T1:  Negative. MRI THORACIC SPINE FINDINGS There is exaggeration of the normal thoracic kyphosis. Vertebral body height, signal and alignment are maintained. The thoracic cord demonstrates normal signal throughout. Imaged paraspinous structures are unremarkable. T1-2 to T4-5 are negative. T5-6: There is a shallow right paracentral protrusion but the central canal and foramina are open. T6-7: Minimal disc bulge without central canal or foraminal stenosis. T7-8: Minimal disc bulge and facet degenerative change. The central canal and foramina are open. T8-9: Minimal disc bulge and facet degenerative change. The central canal and foramina are open. T9-10: Shallow disc bulge the right and facet arthropathy without central canal or foraminal stenosis. T10-11: Facet degenerative disease.  Otherwise negative. T11-12: Facet degenerative change.  Otherwise negative. T12-L1:  Facet degenerative disease.  Otherwise negative. IMPRESSION: Negative for evidence of infectious or inflammatory process. No finding to explain the patient's fever and white blood cell count. Very mild, hazy edema within the cervical cord is present at C3-4. While this could be due to multiple sclerosis, it is likely secondary to spondylosis with a broad-based disc bulge causing severe central canal stenosis and marked flattening of the cord identified. The foramina are also severely narrowed at this level. Central disc protrusion at C2-3 contacts and slightly deforms the ventral cord. Deformity of the ventral cord at C5-6 where there is also moderately severe to severe right foraminal narrowing and moderate left foraminal narrowing. Slight deformity of the ventral cord at C6-7 due to a shallow disc bulge. Moderate right foraminal narrowing is present at this level. Mild thoracic spondylosis without notable central canal narrowing. The thoracic cord demonstrates normal signal throughout.  Electronically Signed   By: Drusilla Kanner M.D.   On: 04/15/2015 10:49   Ct Abdomen Pelvis W Contrast  04/14/2015  CLINICAL DATA:  Scrotal swelling.  Increased weakness in the legs. EXAM: CT ABDOMEN AND PELVIS WITH CONTRAST TECHNIQUE: Multidetector CT imaging of the abdomen and pelvis was performed using the standard protocol following bolus administration of intravenous contrast. CONTRAST:  OMNIPAQUE IOHEXOL 300 MG/ML  SOLN COMPARISON:  None. FINDINGS: Lower chest: Small bilateral pleural effusions. Normal heart size. Coronary artery atherosclerosis in the LAD and circumflex. Hepatobiliary: Normal liver. High-density  material in the dependent portion of the gallbladder likely reflecting cholelithiasis versus sludge. Pancreas: Normal. Spleen: Normal. Adrenals/Urinary Tract: Normal adrenal glands. Multiple bilateral hypodense renal masses with the largest in the right kidney measuring 3.1 x 3 cm most consistent with cysts. No urolithiasis or obstructive uropathy. Partially decompressed bladder. Stomach/Bowel: No bowel wall thickening or dilatation. Sigmoid diverticulosis without evidence of diverticulitis. No pneumatosis, pneumoperitoneum or portal venous gas. No abdominal or pelvic free fluid. Vascular/Lymphatic: Normal caliber abdominal aorta with atherosclerosis. No lymphadenopathy. Other: No fluid collection or hematoma. Fat containing left inguinal hernia. Musculoskeletal: No lytic or sclerotic osseous lesion. No acute osseous abnormality. IMPRESSION: 1. Small bilateral pleural effusions. 2. Multi vessel coronary artery atherosclerosis. 3. Sigmoid diverticulosis without evidence of diverticulitis. Electronically Signed   By: Elige Ko   On: 04/14/2015 16:40   Ct Tibia Fibula Left W Contrast  04/14/2015  CLINICAL DATA:  Increasing weakness in the legs and difficulty walking. Multiple sclerosis. Erythema in draining wounds along the legs, possible sepsis/cellulitis. EXAM: CT OF THE LEFT TIBIA AND  FIBULA WITH CONTRAST; CT OF THE RIGHT TIBIA AND FIBULA WITH CONTRAST TECHNIQUE: Contiguous axial CT images were obtained through the right lower leg and also through the left lower leg. Dedicated multiplanar reformatting free to leg was performed. CONTRAST:  OMNIPAQUE IOHEXOL 300 MG/ML  SOLN COMPARISON:  None FINDINGS: CT left lower leg: Arterial atherosclerosis noted. No high-grade stenosis in the larger vessels but today's exam was not performed as a CT angiogram. Low grade subcutaneous edema in the distal thigh. No knee effusion. The subcutaneous becomes more confluent in the calf, especially inferiorly, and is generally circumferential. There is atrophy of the calf musculature particularly the posterior compartment, but symmetric to the contralateral side. No abscess or gas tracking in the soft tissues. No tibiotalar joint effusion. No specific supportive evidence of myositis/fasciitis. Most of the edema appears confined to the subcutaneous tissues. Deeper fatty tissues, for example along Kager's fat pad, do not appear significantly infiltrated. No bony destructive findings characteristic of osteomyelitis. No fracture observed. CT right lower leg: As on the contralateral side, there is low-grade subcutaneous edema circumferentially in the lower thigh which becomes more confluent in the calf region. The degree of subcutaneous edema is symmetric. No abnormal gas tracking within the soft tissues or abscess identified. As compared to the contralateral side, there is slightly more infiltration of Kager's fat pad on the right than the left. The edema is also more confluent tracking in the dorsum of the right foot than the dorsum of the left foot. No significant asymmetry of the musculature. Moderate fatty atrophy of the musculature. No drainable abscess. Symmetric atherosclerosis. No knee or tibiotalar joint effusion. No findings of osteomyelitis by CT. IMPRESSION: 1. The bilateral lower extremity deep edema  most confluent in the calves and ankle regions, slightly more prominent on the right side in the dorsum of the foot. On the left side the edema appears relatively confined to the subcutaneous tissues worse on the right side there is some mild infiltration of Kager's fat pad. No definite evidence of myositis or fasciitis. No gas in the soft tissues. No drainable abscess or findings of osteomyelitis identified in the lower legs. 2. Atherosclerosis. Electronically Signed   By: Gaylyn Rong M.D.   On: 04/14/2015 14:58   Ct Tibia Fibula Right W Contrast  04/14/2015  CLINICAL DATA:  Increasing weakness in the legs and difficulty walking. Multiple sclerosis. Erythema in draining wounds along the legs, possible sepsis/cellulitis. EXAM:  CT OF THE LEFT TIBIA AND FIBULA WITH CONTRAST; CT OF THE RIGHT TIBIA AND FIBULA WITH CONTRAST TECHNIQUE: Contiguous axial CT images were obtained through the right lower leg and also through the left lower leg. Dedicated multiplanar reformatting free to leg was performed. CONTRAST:  OMNIPAQUE IOHEXOL 300 MG/ML  SOLN COMPARISON:  None FINDINGS: CT left lower leg: Arterial atherosclerosis noted. No high-grade stenosis in the larger vessels but today's exam was not performed as a CT angiogram. Low grade subcutaneous edema in the distal thigh. No knee effusion. The subcutaneous becomes more confluent in the calf, especially inferiorly, and is generally circumferential. There is atrophy of the calf musculature particularly the posterior compartment, but symmetric to the contralateral side. No abscess or gas tracking in the soft tissues. No tibiotalar joint effusion. No specific supportive evidence of myositis/fasciitis. Most of the edema appears confined to the subcutaneous tissues. Deeper fatty tissues, for example along Kager's fat pad, do not appear significantly infiltrated. No bony destructive findings characteristic of osteomyelitis. No fracture observed. CT right lower leg:  As on the contralateral side, there is low-grade subcutaneous edema circumferentially in the lower thigh which becomes more confluent in the calf region. The degree of subcutaneous edema is symmetric. No abnormal gas tracking within the soft tissues or abscess identified. As compared to the contralateral side, there is slightly more infiltration of Kager's fat pad on the right than the left. The edema is also more confluent tracking in the dorsum of the right foot than the dorsum of the left foot. No significant asymmetry of the musculature. Moderate fatty atrophy of the musculature. No drainable abscess. Symmetric atherosclerosis. No knee or tibiotalar joint effusion. No findings of osteomyelitis by CT. IMPRESSION: 1. The bilateral lower extremity deep edema most confluent in the calves and ankle regions, slightly more prominent on the right side in the dorsum of the foot. On the left side the edema appears relatively confined to the subcutaneous tissues worse on the right side there is some mild infiltration of Kager's fat pad. No definite evidence of myositis or fasciitis. No gas in the soft tissues. No drainable abscess or findings of osteomyelitis identified in the lower legs. 2. Atherosclerosis. Electronically Signed   By: Gaylyn Rong M.D.   On: 04/14/2015 14:58    Jeoffrey Massed, MD  [facility] Pager:[phone]  If 7PM-7AM, please contact night-coverage [email or web address] Password [email or web address] 04/17/2015, 1:18 PM   LOS: 4 days

## 2015-04-17 NOTE — Clinical Social Work Note (Signed)
Clinical Social Work Assessment  Patient Details  Name: Martin Powell MRN: [id number] Date of Birth: 18-Jan-1946  Date of referral:  04/16/15               Reason for consult:  Facility Placement                Permission sought to share information with:  Facility Medical sales representative, Family Supports Permission granted to share information::  Yes, Verbal Permission Granted  Name::     Roosvelt Harps, Sister [phone]  Agency::  SNF admissions  Relationship::     Contact Information:     Housing/Transportation Living arrangements for the past 2 months:  Independent Living Facility Source of Information:  Patient, Other (Comment Required) (Patient's sister) Patient Interpreter Needed:  None Criminal Activity/Legal Involvement Pertinent to Current Situation/Hospitalization:  No - Comment as needed Significant Relationships:  Other Family Members Lives with:  Self Do you feel safe going back to the place where you live?  Yes Need for family participation in patient care:  No (Coment)  Care giving concerns:  Patient and family feel he needs some short term rehab in order to gain his strength back in order to return back home.   Social Worker assessment / plan:  Patient is a pleasant 70 year old male who lives alone.  Patient is alert and oriented x4 and able to make his own decisions.  Patient states he has never been to short term rehab before, CSW explained to patient what to expect and the process to look for SNF bed placement.  CSW also explained how insurance pays for his stay and what to expect day of discharge from hospital.  Patient stated he would like to be close to [facility] if possible, since he lives in the area or close to [facility].  Patient expressed understanding and did not have any other questions.   Employment status:  Retired Health and safety inspector:  Medicare PT Recommendations:  Skilled Nursing Facility Information / Referral to community resources:  Skilled  Nursing Facility  Patient/Family's Response to care:  Patient and family agreeable to SNF for short term rehab.  Patient/Family's Understanding of and Emotional Response to Diagnosis, Current Treatment, and Prognosis:  Patient and family aware of current diagnosis and current treatment plan.  Emotional Assessment Appearance:  Appears stated age Attitude/Demeanor/Rapport:    Affect (typically observed):    Orientation:  Oriented to Self, Oriented to Place, Oriented to  Time, Oriented to Situation Alcohol / Substance use:  Not Applicable Psych involvement (Current and /or in the community):  No (Comment)  Discharge Needs  Concerns to be addressed:  Lack of Support Readmission within the last 30 days:  No Current discharge risk:  Lives alone Barriers to Discharge:  No Barriers Identified   Darleene Cleaver, LCSWA 04/17/2015, 10:21 AM

## 2015-04-18 DIAGNOSIS — I272 Other secondary pulmonary hypertension: Secondary | ICD-10-CM

## 2015-04-18 DIAGNOSIS — N5089 Other specified disorders of the male genital organs: Secondary | ICD-10-CM | POA: Insufficient documentation

## 2015-04-18 LAB — URINE CULTURE: Culture: 3000

## 2015-04-18 LAB — GLUCOSE, CAPILLARY: GLUCOSE-CAPILLARY: 93 mg/dL (ref 65–99)

## 2015-04-18 MED ORDER — GABAPENTIN 300 MG PO CAPS: 300.0000 mg | ORAL_CAPSULE | Freq: Three times a day (TID) | ORAL | Status: DC

## 2015-04-18 MED ORDER — FUROSEMIDE 20 MG PO TABS
20.0000 mg | ORAL_TABLET | Freq: Every day | ORAL | Status: DC
Start: 2015-04-18 — End: 2015-12-10

## 2015-04-18 MED ORDER — RIVAROXABAN 20 MG PO TABS: 20.0000 mg | ORAL_TABLET | Freq: Every day | ORAL | Status: DC

## 2015-04-18 MED ORDER — HYDROCODONE-ACETAMINOPHEN 5-325 MG PO TABS: 1.0000 | ORAL_TABLET | Freq: Four times a day (QID) | ORAL | Status: DC | PRN

## 2015-04-18 MED ORDER — DOXYCYCLINE HYCLATE 100 MG PO TBEC: 100.0000 mg | DELAYED_RELEASE_TABLET | Freq: Two times a day (BID) | ORAL | Status: DC

## 2015-04-18 MED ORDER — POTASSIUM CHLORIDE CRYS ER 20 MEQ PO TBCR: 20.0000 meq | EXTENDED_RELEASE_TABLET | Freq: Every day | ORAL | Status: DC

## 2015-04-18 NOTE — Progress Notes (Signed)
OT Cancellation Note  Patient Details Name: Martin Powell MRN: [id number] DOB: 05-15-1945   Cancelled Treatment:    Reason Eval/Treat Not Completed: OT screened, no acute needs identified, will sign off. Pt is Medicare and current D/C plan is SNF (planning to leave today). No apparent immediate acute care OT needs, therefore will defer OT to SNF. If OT eval is needed please call Acute Rehab Dept. at [phone] or text page OT at [phone].    Earlie Raveling OTR/L [phone] 04/18/2015, 10:48 AM

## 2015-04-18 NOTE — Progress Notes (Signed)
Orthopedic Tech Progress Note Patient Details:  Martin Powell 28-Feb-1946 [id number]  Patient ID: Martin Powell, male   DOB: 01/06/1946, 70 y.o.   MRN: [id number] Called in bio-tech brace order; spoke with Anderson Malta, Martin Powell 04/18/2015, 11:20 AM

## 2015-04-18 NOTE — Progress Notes (Signed)
Patient will DC to: [facility] Anticipated DC date: 04/18/15 Family notified: Sister Transport by: PTAR  CSW signing off.  Cristobal Goldmann, [place] Clinical Social Worker [phone]

## 2015-04-18 NOTE — Discharge Summary (Addendum)
PATIENT DETAILS Name: Martin Powell Age: 70 y.o. Sex: male Date of Birth: December 20, 1945 MRN: [id number]. Admitting Physician: Edsel Petrin, DO PCP:No PCP Per Patient  Admit Date: 04/13/2015 Discharge date: 04/18/2015  Recommendations for Outpatient Follow-up:  1. Ensure follow-up with neurosurgery-Dr Botero 2. Ensure  follow up with primary neurologist  3. Refer to Cardiology-Pul HTN 4. Please repeat CBC/BMET in 1 week 5. Please follow blood cultures till final  PRIMARY DISCHARGE DIAGNOSIS:  Principal Problem:   Sepsis (HCC) Active Problems:   Hypothyroidism   Multiple sclerosis (HCC)   Cellulitis LOWER EXTREMITIES.   Pulmonary hypertension (HCC)   Scrotal swelling      PAST MEDICAL HISTORY: Past Medical History  Diagnosis Date  . Hypertension   . GERD (gastroesophageal reflux disease)   . Hypercholesteremia   . Thyroid activity decreased   . Multiple sclerosis (HCC)   . Murmur, heart     DISCHARGE MEDICATIONS: Current Discharge Medication List    START taking these medications   Details  doxycycline (DORYX) 100 MG EC tablet Take 1 tablet (100 mg total) by mouth 2 (two) times daily. 4 more days from 04/18/15    furosemide (LASIX) 20 MG tablet Take 1 tablet (20 mg total) by mouth daily.      CONTINUE these medications which have CHANGED   Details  gabapentin (NEURONTIN) 300 MG capsule Take 1 capsule (300 mg total) by mouth 3 (three) times daily.    HYDROcodone-acetaminophen (NORCO/VICODIN) 5-325 MG tablet Take 1 tablet by mouth every 6 (six) hours as needed. Qty: 30 tablet, Refills: 0    potassium chloride SA (K-DUR,KLOR-CON) 20 MEQ tablet Take 1 tablet (20 mEq total) by mouth daily.    Rivaroxaban (XARELTO) 20 MG TABS tablet Take 1 tablet (20 mg total) by mouth daily with supper.      CONTINUE these medications which have NOT CHANGED   Details  acetaminophen (TYLENOL) 325 MG tablet Take 325 mg by mouth every 6 (six) hours as needed for mild pain or  moderate pain.    atorvastatin (LIPITOR) 80 MG tablet Take 80 mg by mouth daily.    Cholecalciferol (VITAMIN D3) 2000 UNITS TABS Take 1 tablet by mouth daily.    diltiazem (TIAZAC) 120 MG 24 hr capsule Take 120 mg by mouth daily.    Dimethyl Fumarate (TECFIDERA) 240 MG CPDR Take 240 mg by mouth 2 (two) times daily.     levothyroxine (SYNTHROID, LEVOTHROID) 112 MCG tablet Take 112 mcg by mouth daily before breakfast.    pantoprazole (PROTONIX) 40 MG tablet Take 40 mg by mouth at bedtime. To prevent acid reflux or bleeding (while on Xarelto).    sotalol (BETAPACE) 80 MG tablet Take 40 mg by mouth 2 (two) times daily.       STOP taking these medications     aspirin 81 MG tablet         ALLERGIES:  No Known Allergies  BRIEF HPI:  See H&P, Labs, Consult and Test reports for all details in brief, patient was admitted for worsening weakness and sepsis from lower extremity cellulitis.  CONSULTATIONS:   neurology  PERTINENT RADIOLOGIC STUDIES: Dg Chest 2 View  04/13/2015  CLINICAL DATA:  Fall today with bilateral leg pain and weakness EXAM: CHEST - 2 VIEW COMPARISON:  02/11/2015 FINDINGS: Cardiac shadow remains enlarged. Patient is rotated to the right accentuating the mediastinal markings. No focal infiltrate or sizable effusion is seen. No acute bony abnormality is noted. IMPRESSION: No active disease. Electronically Signed  By: Alcide Clever M.D.   On: 04/13/2015 16:08   Ct Angio Chest Pe W/cm &/or Wo Cm  04/17/2015  CLINICAL DATA:  Worsening chest pain and shortness of breath over past month. Lower extremity swelling. Pulmonary hypertension. Clinical suspicion for pulmonary embolism. EXAM: CT ANGIOGRAPHY CHEST WITH CONTRAST TECHNIQUE: Multidetector CT imaging of the chest was performed using the standard protocol during bolus administration of intravenous contrast. Multiplanar CT image reconstructions and MIPs were obtained to evaluate the vascular anatomy. CONTRAST:  75mL OMNIPAQUE  IOHEXOL 350 MG/ML SOLN COMPARISON:  None. FINDINGS: Mediastinum/Lymph Nodes: No pulmonary emboli or thoracic aortic dissection identified. Moderate cardiomegaly and coronary artery calcification noted. No evidence of pericardial effusion. No masses or pathologically enlarged lymph nodes identified. Lungs/Pleura: Small pleural effusions are seen bilaterally. There is also mild patchy ground-glass opacity seen bilaterally, suspicious for mild pulmonary edema. No evidence pulmonary consolidation or mass. Upper abdomen: Small cholesterol gallstones incidentally noted, without evidence of cholecystitis. Musculoskeletal: No chest wall mass or suspicious bone lesions identified. Review of the MIP images confirms the above findings. IMPRESSION: No evidence of pulmonary embolism. Cardiomegaly, small bilateral pleural effusions, and patchy ground-glass pulmonary opacity suspicious for mild edema/congestive heart failure. Cholelithiasis.  No radiographic evidence of cholecystitis. Electronically Signed   By: Myles Rosenthal M.D.   On: 04/17/2015 13:02   Mr Brain Wo Contrast  04/14/2015  CLINICAL DATA:  Initial evaluation for global weakness. History of multiple sclerosis. EXAM: MRI HEAD WITHOUT CONTRAST TECHNIQUE: Multiplanar, multiecho pulse sequences of the brain and surrounding structures were obtained without intravenous contrast. COMPARISON:  Prior CT from 10/01/2014. FINDINGS: Study is limited as the patient was unable to tolerate the full length of the exam. Diffusion-weighted sequences, sagittal T1, axial T2, and axial FLAIR sequences were performed. Diffuse prominence of the CSF containing spaces is compatible with generalized cerebral atrophy. Patchy T2/FLAIR hyperintensity within the periventricular and deep white matter present. While findings may be related to chronic small vessel ischemic in a, multiple sclerosis could also have this appearance given provided history. There are scattered superimposed small 0  chronic lacunar infarcts involving the periventricular white matter as well as the basal ganglia and thalami. Remote lacunar infarct within the pons as well. Diffusion-weighted sequences demonstrates subtle diffusion abnormality within the left aspect of the anterior genu of the corpus callosum (series 5, image 31). This extends superiorly and posteriorly within the left parasagittal region. No significant signal dropout seen on corresponding ADC map. Finding may reflect a small focus of subacute ischemia. Active demyelination could also be considered as there is mildly hyperintense T2/FLAIR signal intensity associated with this signal abnormality. No other evidence for acute infarct. Major intracranial vascular flow voids are maintained. No mass lesion, midline shift, or mass effect. Ventricular prominence related to global parenchymal volume loss present without hydrocephalus. No extra-axial fluid collection. Craniocervical junction within normal limits. Degenerative disc bulge noted at C2-3 within the upper cervical spine with associated mild to moderate canal stenosis. Pituitary gland normal.  No acute abnormality about the orbits. Mild mucosal thickening within the ethmoidal air cells. Paranasal sinuses are otherwise clear. No mastoid effusion. Bone marrow signal intensity normal. No scalp soft tissue abnormality. IMPRESSION: 1. Subtle patchy diffusion signal abnormality within the left aspect of the anterior genu of the corpus callosum with extension into the parasagittal left frontal lobe. Finding favored to reflect small foci of subacute ischemia. Possible active demyelination could also be considered given the provided history of multiple sclerosis, although this is less favored.  No associated mass effect. 2. Confluent and patchy T2/FLAIR hyperintensity within the periventricular and deep white matter both cerebral hemispheres. Finding favored to in large part reflect sequela of moderate chronic small  vessel ischemic changes, although again, demyelinating disease could also have this appearance. 3. Scattered remote lacunar infarcts involving the periventricular white matter of the bilateral corona radiata as well as the basal ganglia, thalami, and pons. Electronically Signed   By: Rise Mu M.D.   On: 04/14/2015 01:25   Mr Cervical Spine Wo Contrast  04/15/2015  CLINICAL DATA:  History of multiple sclerosis with worsening lower extremity weakness bilaterally. Fever and elevated white blood cell count. Initial encounter. EXAM: MRI CERVICAL AND THORACIC SPINE WITHOUT CONTRAST TECHNIQUE: Multiplanar and multiecho pulse sequences of the cervical spine, to include the craniocervical junction and cervicothoracic junction, and thoracic spine, were obtained without intravenous contrast. COMPARISON:  None. FINDINGS: MRI CERVICAL SPINE FINDINGS The scan toward with and without contrast. Contrast could not be administered as the patient could not tolerate additional scanning. Vertebral body height and alignment are maintained. Marrow signal is mildly heterogeneous but no worrisome marrow lesion is identified. The craniocervical junction is normal. Small foci of T2 hyperintensity in the pons are likely due to chronic ischemic change. Very mild, hazy edema is seen within the cervical cord at C3-4. Cervical cord signal is otherwise unremarkable. Imaged paraspinous structures appear normal. C2-3: A central disc protrusion contacts and mildly deforms the ventral cord. The foramina are open. Facet degenerative change is noted. C3-4: There is a large broad-based central disc protrusion causing severe central canal stenosis and marked flattening of the cord. Uncovertebral disease causes severe bilateral foraminal narrowing. C4-5: There is a shallow disc bulge and central protrusion. The ventral thecal sac is effaced. Mild to moderate foraminal narrowing is seen bilaterally. C5-6: The patient has a disc bulge and right  worse than left uncovertebral disease. There is deformity of the ventral cord. Moderate left and moderately severe to severe right foraminal narrowing is present. C6-7: A disc osteophyte complex and uncovertebral disease are seen. There is slight deformity of the ventral cord. Moderate right foraminal narrowing is present. The left foramen is open. C7-T1:  Negative. MRI THORACIC SPINE FINDINGS There is exaggeration of the normal thoracic kyphosis. Vertebral body height, signal and alignment are maintained. The thoracic cord demonstrates normal signal throughout. Imaged paraspinous structures are unremarkable. T1-2 to T4-5 are negative. T5-6: There is a shallow right paracentral protrusion but the central canal and foramina are open. T6-7: Minimal disc bulge without central canal or foraminal stenosis. T7-8: Minimal disc bulge and facet degenerative change. The central canal and foramina are open. T8-9: Minimal disc bulge and facet degenerative change. The central canal and foramina are open. T9-10: Shallow disc bulge the right and facet arthropathy without central canal or foraminal stenosis. T10-11: Facet degenerative disease.  Otherwise negative. T11-12: Facet degenerative change.  Otherwise negative. T12-L1:  Facet degenerative disease.  Otherwise negative. IMPRESSION: Negative for evidence of infectious or inflammatory process. No finding to explain the patient's fever and white blood cell count. Very mild, hazy edema within the cervical cord is present at C3-4. While this could be due to multiple sclerosis, it is likely secondary to spondylosis with a broad-based disc bulge causing severe central canal stenosis and marked flattening of the cord identified. The foramina are also severely narrowed at this level. Central disc protrusion at C2-3 contacts and slightly deforms the ventral cord. Deformity of the ventral cord at C5-6 where  there is also moderately severe to severe right foraminal narrowing and moderate  left foraminal narrowing. Slight deformity of the ventral cord at C6-7 due to a shallow disc bulge. Moderate right foraminal narrowing is present at this level. Mild thoracic spondylosis without notable central canal narrowing. The thoracic cord demonstrates normal signal throughout. Electronically Signed   By: Drusilla Kanner M.D.   On: 04/15/2015 10:49   Mr Thoracic Spine Wo Contrast  04/15/2015  CLINICAL DATA:  History of multiple sclerosis with worsening lower extremity weakness bilaterally. Fever and elevated white blood cell count. Initial encounter. EXAM: MRI CERVICAL AND THORACIC SPINE WITHOUT CONTRAST TECHNIQUE: Multiplanar and multiecho pulse sequences of the cervical spine, to include the craniocervical junction and cervicothoracic junction, and thoracic spine, were obtained without intravenous contrast. COMPARISON:  None. FINDINGS: MRI CERVICAL SPINE FINDINGS The scan toward with and without contrast. Contrast could not be administered as the patient could not tolerate additional scanning. Vertebral body height and alignment are maintained. Marrow signal is mildly heterogeneous but no worrisome marrow lesion is identified. The craniocervical junction is normal. Small foci of T2 hyperintensity in the pons are likely due to chronic ischemic change. Very mild, hazy edema is seen within the cervical cord at C3-4. Cervical cord signal is otherwise unremarkable. Imaged paraspinous structures appear normal. C2-3: A central disc protrusion contacts and mildly deforms the ventral cord. The foramina are open. Facet degenerative change is noted. C3-4: There is a large broad-based central disc protrusion causing severe central canal stenosis and marked flattening of the cord. Uncovertebral disease causes severe bilateral foraminal narrowing. C4-5: There is a shallow disc bulge and central protrusion. The ventral thecal sac is effaced. Mild to moderate foraminal narrowing is seen bilaterally. C5-6: The patient  has a disc bulge and right worse than left uncovertebral disease. There is deformity of the ventral cord. Moderate left and moderately severe to severe right foraminal narrowing is present. C6-7: A disc osteophyte complex and uncovertebral disease are seen. There is slight deformity of the ventral cord. Moderate right foraminal narrowing is present. The left foramen is open. C7-T1:  Negative. MRI THORACIC SPINE FINDINGS There is exaggeration of the normal thoracic kyphosis. Vertebral body height, signal and alignment are maintained. The thoracic cord demonstrates normal signal throughout. Imaged paraspinous structures are unremarkable. T1-2 to T4-5 are negative. T5-6: There is a shallow right paracentral protrusion but the central canal and foramina are open. T6-7: Minimal disc bulge without central canal or foraminal stenosis. T7-8: Minimal disc bulge and facet degenerative change. The central canal and foramina are open. T8-9: Minimal disc bulge and facet degenerative change. The central canal and foramina are open. T9-10: Shallow disc bulge the right and facet arthropathy without central canal or foraminal stenosis. T10-11: Facet degenerative disease.  Otherwise negative. T11-12: Facet degenerative change.  Otherwise negative. T12-L1:  Facet degenerative disease.  Otherwise negative. IMPRESSION: Negative for evidence of infectious or inflammatory process. No finding to explain the patient's fever and white blood cell count. Very mild, hazy edema within the cervical cord is present at C3-4. While this could be due to multiple sclerosis, it is likely secondary to spondylosis with a broad-based disc bulge causing severe central canal stenosis and marked flattening of the cord identified. The foramina are also severely narrowed at this level. Central disc protrusion at C2-3 contacts and slightly deforms the ventral cord. Deformity of the ventral cord at C5-6 where there is also moderately severe to severe right  foraminal narrowing and moderate left foraminal  narrowing. Slight deformity of the ventral cord at C6-7 due to a shallow disc bulge. Moderate right foraminal narrowing is present at this level. Mild thoracic spondylosis without notable central canal narrowing. The thoracic cord demonstrates normal signal throughout. Electronically Signed   By: Drusilla Kanner M.D.   On: 04/15/2015 10:49   Ct Abdomen Pelvis W Contrast  04/14/2015  CLINICAL DATA:  Scrotal swelling.  Increased weakness in the legs. EXAM: CT ABDOMEN AND PELVIS WITH CONTRAST TECHNIQUE: Multidetector CT imaging of the abdomen and pelvis was performed using the standard protocol following bolus administration of intravenous contrast. CONTRAST:  OMNIPAQUE IOHEXOL 300 MG/ML  SOLN COMPARISON:  None. FINDINGS: Lower chest: Small bilateral pleural effusions. Normal heart size. Coronary artery atherosclerosis in the LAD and circumflex. Hepatobiliary: Normal liver. High-density material in the dependent portion of the gallbladder likely reflecting cholelithiasis versus sludge. Pancreas: Normal. Spleen: Normal. Adrenals/Urinary Tract: Normal adrenal glands. Multiple bilateral hypodense renal masses with the largest in the right kidney measuring 3.1 x 3 cm most consistent with cysts. No urolithiasis or obstructive uropathy. Partially decompressed bladder. Stomach/Bowel: No bowel wall thickening or dilatation. Sigmoid diverticulosis without evidence of diverticulitis. No pneumatosis, pneumoperitoneum or portal venous gas. No abdominal or pelvic free fluid. Vascular/Lymphatic: Normal caliber abdominal aorta with atherosclerosis. No lymphadenopathy. Other: No fluid collection or hematoma. Fat containing left inguinal hernia. Musculoskeletal: No lytic or sclerotic osseous lesion. No acute osseous abnormality. IMPRESSION: 1. Small bilateral pleural effusions. 2. Multi vessel coronary artery atherosclerosis. 3. Sigmoid diverticulosis without evidence of  diverticulitis. Electronically Signed   By: Elige Ko   On: 04/14/2015 16:40   Ct Tibia Fibula Left W Contrast  04/14/2015  CLINICAL DATA:  Increasing weakness in the legs and difficulty walking. Multiple sclerosis. Erythema in draining wounds along the legs, possible sepsis/cellulitis. EXAM: CT OF THE LEFT TIBIA AND FIBULA WITH CONTRAST; CT OF THE RIGHT TIBIA AND FIBULA WITH CONTRAST TECHNIQUE: Contiguous axial CT images were obtained through the right lower leg and also through the left lower leg. Dedicated multiplanar reformatting free to leg was performed. CONTRAST:  OMNIPAQUE IOHEXOL 300 MG/ML  SOLN COMPARISON:  None FINDINGS: CT left lower leg: Arterial atherosclerosis noted. No high-grade stenosis in the larger vessels but today's exam was not performed as a CT angiogram. Low grade subcutaneous edema in the distal thigh. No knee effusion. The subcutaneous becomes more confluent in the calf, especially inferiorly, and is generally circumferential. There is atrophy of the calf musculature particularly the posterior compartment, but symmetric to the contralateral side. No abscess or gas tracking in the soft tissues. No tibiotalar joint effusion. No specific supportive evidence of myositis/fasciitis. Most of the edema appears confined to the subcutaneous tissues. Deeper fatty tissues, for example along Kager's fat pad, do not appear significantly infiltrated. No bony destructive findings characteristic of osteomyelitis. No fracture observed. CT right lower leg: As on the contralateral side, there is low-grade subcutaneous edema circumferentially in the lower thigh which becomes more confluent in the calf region. The degree of subcutaneous edema is symmetric. No abnormal gas tracking within the soft tissues or abscess identified. As compared to the contralateral side, there is slightly more infiltration of Kager's fat pad on the right than the left. The edema is also more confluent tracking in the  dorsum of the right foot than the dorsum of the left foot. No significant asymmetry of the musculature. Moderate fatty atrophy of the musculature. No drainable abscess. Symmetric atherosclerosis. No knee or tibiotalar joint effusion. No  findings of osteomyelitis by CT. IMPRESSION: 1. The bilateral lower extremity deep edema most confluent in the calves and ankle regions, slightly more prominent on the right side in the dorsum of the foot. On the left side the edema appears relatively confined to the subcutaneous tissues worse on the right side there is some mild infiltration of Kager's fat pad. No definite evidence of myositis or fasciitis. No gas in the soft tissues. No drainable abscess or findings of osteomyelitis identified in the lower legs. 2. Atherosclerosis. Electronically Signed   By: Gaylyn Rong M.D.   On: 04/14/2015 14:58   Ct Tibia Fibula Right W Contrast  04/14/2015  CLINICAL DATA:  Increasing weakness in the legs and difficulty walking. Multiple sclerosis. Erythema in draining wounds along the legs, possible sepsis/cellulitis. EXAM: CT OF THE LEFT TIBIA AND FIBULA WITH CONTRAST; CT OF THE RIGHT TIBIA AND FIBULA WITH CONTRAST TECHNIQUE: Contiguous axial CT images were obtained through the right lower leg and also through the left lower leg. Dedicated multiplanar reformatting free to leg was performed. CONTRAST:  OMNIPAQUE IOHEXOL 300 MG/ML  SOLN COMPARISON:  None FINDINGS: CT left lower leg: Arterial atherosclerosis noted. No high-grade stenosis in the larger vessels but today's exam was not performed as a CT angiogram. Low grade subcutaneous edema in the distal thigh. No knee effusion. The subcutaneous becomes more confluent in the calf, especially inferiorly, and is generally circumferential. There is atrophy of the calf musculature particularly the posterior compartment, but symmetric to the contralateral side. No abscess or gas tracking in the soft tissues. No tibiotalar joint  effusion. No specific supportive evidence of myositis/fasciitis. Most of the edema appears confined to the subcutaneous tissues. Deeper fatty tissues, for example along Kager's fat pad, do not appear significantly infiltrated. No bony destructive findings characteristic of osteomyelitis. No fracture observed. CT right lower leg: As on the contralateral side, there is low-grade subcutaneous edema circumferentially in the lower thigh which becomes more confluent in the calf region. The degree of subcutaneous edema is symmetric. No abnormal gas tracking within the soft tissues or abscess identified. As compared to the contralateral side, there is slightly more infiltration of Kager's fat pad on the right than the left. The edema is also more confluent tracking in the dorsum of the right foot than the dorsum of the left foot. No significant asymmetry of the musculature. Moderate fatty atrophy of the musculature. No drainable abscess. Symmetric atherosclerosis. No knee or tibiotalar joint effusion. No findings of osteomyelitis by CT. IMPRESSION: 1. The bilateral lower extremity deep edema most confluent in the calves and ankle regions, slightly more prominent on the right side in the dorsum of the foot. On the left side the edema appears relatively confined to the subcutaneous tissues worse on the right side there is some mild infiltration of Kager's fat pad. No definite evidence of myositis or fasciitis. No gas in the soft tissues. No drainable abscess or findings of osteomyelitis identified in the lower legs. 2. Atherosclerosis. Electronically Signed   By: Gaylyn Rong M.D.   On: 04/14/2015 14:58     PERTINENT LAB RESULTS: CBC:  Recent Labs  04/16/15 0640  WBC 5.9  HGB 10.2*  HCT 33.2*  PLT 157   CMET CMP     Component Value Date/Time   NA 141 04/17/2015 0805   K 3.2* 04/17/2015 0805   CL 107 04/17/2015 0805   CO2 25 04/17/2015 0805   GLUCOSE 102* 04/17/2015 0805   BUN 10 04/17/2015 0805  CREATININE 0.84 04/17/2015 0805   CALCIUM 8.3* 04/17/2015 0805   PROT 6.2* 04/14/2015 0210   ALBUMIN 3.0* 04/14/2015 0210   AST 22 04/14/2015 0210   ALT 12* 04/14/2015 0210   ALKPHOS 74 04/14/2015 0210   BILITOT 1.5* 04/14/2015 0210   GFRNONAA >60 04/17/2015 0805   GFRAA >60 04/17/2015 0805    GFR Estimated Creatinine Clearance: 113.4 mL/min (by C-G formula based on Cr of 0.84). No results for input(s): LIPASE, AMYLASE in the last 72 hours. No results for input(s): CKTOTAL, CKMB, CKMBINDEX, TROPONINI in the last 72 hours. Invalid input(s): POCBNP No results for input(s): DDIMER in the last 72 hours. No results for input(s): HGBA1C in the last 72 hours. No results for input(s): CHOL, HDL, LDLCALC, TRIG, CHOLHDL, LDLDIRECT in the last 72 hours. No results for input(s): TSH, T4TOTAL, T3FREE, THYROIDAB in the last 72 hours.  Invalid input(s): FREET3 No results for input(s): VITAMINB12, FOLATE, FERRITIN, TIBC, IRON, RETICCTPCT in the last 72 hours. Coags: No results for input(s): INR in the last 72 hours.  Invalid input(s): PT Microbiology: Recent Results (from the past 240 hour(s))  Culture, blood (x 2)     Status: None (Preliminary result)   Collection Time: 04/14/15  1:49 AM  Result Value Ref Range Status   Specimen Description BLOOD RIGHT ANTECUBITAL  Final   Special Requests BOTTLES DRAWN AEROBIC AND ANAEROBIC   Final   Culture NO GROWTH 4 DAYS  Final   Report Status PENDING  Incomplete  Culture, blood (x 2)     Status: None (Preliminary result)   Collection Time: 04/14/15  1:55 AM  Result Value Ref Range Status   Specimen Description BLOOD RIGHT ARM  Final   Special Requests BOTTLES DRAWN AEROBIC ONLY  Final   Culture NO GROWTH 4 DAYS  Final   Report Status PENDING  Incomplete  Urine culture     Status: None   Collection Time: 04/16/15  6:50 PM  Result Value Ref Range Status   Specimen Description URINE, RANDOM  Final   Special Requests NONE  Final    Culture 3,000 COLONIES/mL INSIGNIFICANT GROWTH  Final   Report Status 04/18/2015 FINAL  Final     BRIEF HOSPITAL COURSE:  Sepsis secondary to lower extremity cellulitis: Secondary to left more than right lower extremity cellulitis. Now afebrile, leukocytosis has resolved. Clinically significantly improved. Weakness much better. Blood cultures negative, was on IV vancomycin throughout his hospital stay, will transition to doxycycline for a few more days. Lower extremity Dopplers negative for DVT.   Active Problems: Worsening lower extremity weakness: Suspect secondary to sepsis. However MRI brain shows either a small CVA or a demyelinating changes-Neurology does not think that this is a CVA-no stroke work up recommended. MRI C Spine shows disc prolapse at C3-C4. Discussed with Neurosurgery-Dr Jeral Fruit over the phone on 1/14-reviewed images-suggested a Aspen collar and outpatient follow-up with him. He recommended surgery when more stable. Seen by PT-recommendations are for SNF  Cervical Stenosis: seen on MRI Cspine-motor strength 5/5-see above. Ensure follow up with Neurosurgery-Dr Jeral Fruit.Aspen collar ordered  Severe pulmonary hypertension: Echocardiogram on 1/14 showed PA pressure of 78 mmHg. CT angio chest negative for pulmonary embolism. This is more of an incidental finding, stable for further workup to be done in the outpatient setting.Please refer to outpatient cardiology  History of multiple sclerosis: Continue Dimethyl Fumarate.See above-neurology consulted-did not feel as if the patient requires IV Solu-Medrol as clinically improved.Please ensure follow up with primary neurologist.  History of atrial fibrillation: remains in sinus rhythm.Continue Sotalol, cardizem and Xarelto.  please ensure follow up with primary cardiologist   Hypothyroidism: Continue Synthroid  Dyslipidemia: Continue statins  Scrotal edema/penile edema: Chronic issue per patient-has this for years. Per patient-he  does not have any issues with urination. Follow.  TODAY-DAY OF DISCHARGE:  Subjective:   Martin Powell today has no headache,no chest abdominal pain,no new weakness tingling or numbness, feels much better   Objective:   Blood pressure 149/78, pulse 58, temperature 97.9 F (36.6 C), temperature source Oral, resp. rate 18, height 6\' 3"  (1.905 m), weight 114.8 kg (253 lb 1.4 oz), SpO2 96 %.  Intake/Output Summary (Last 24 hours) at 04/18/15 1140 Last data filed at 04/18/15 1005  Gross per 24 hour  Intake    500 ml  Output   1075 ml  Net   -575 ml   Filed Weights   04/16/15 0658 04/17/15 0434 04/18/15 0422  Weight: 112.3 kg (247 lb 9.2 oz) 114.2 kg (251 lb 12.3 oz) 114.8 kg (253 lb 1.4 oz)    Exam Awake Alert, Oriented *3, No new F.N deficits, Normal affect [place].AT,PERRAL Supple Neck,No JVD, No cervical lymphadenopathy appriciated.  Symmetrical Chest wall movement, Good air movement bilaterally, CTAB RRR,No Gallops,Rubs or new Murmurs, No Parasternal Heave +ve B.Sounds, Abd Soft, Non tender, No organomegaly appriciated, No rebound -guarding or rigidity. No Cyanosis, Clubbing or edema, No new Rash or bruise-has mild erythema/scaling of bilateral lower extremities.  DISCHARGE CONDITION: Stable  DISPOSITION: SNF DISCHARGE INSTRUCTIONS:    Activity:  As tolerated with Full fall precautions use walker/cane & assistance as needed  Use Aspen collar  Get Medicines reviewed and adjusted: Please take all your medications with you for your next visit with your Primary MD  Please request your Primary MD to go over all hospital tests and procedure/radiological results at the follow up, please ask your Primary MD to get all Hospital records sent to his/her office.  If you experience worsening of your admission symptoms, develop shortness of breath, life threatening emergency, suicidal or homicidal thoughts you must seek medical attention immediately by calling 911 or calling your MD  immediately  if symptoms less severe.  You must read complete instructions/literature along with all the possible adverse reactions/side effects for all the Medicines you take and that have been prescribed to you. Take any new Medicines after you have completely understood and accpet all the possible adverse reactions/side effects.   Do not drive when taking Pain medications.   Do not take more than prescribed Pain, Sleep and Anxiety Medications  Special Instructions: If you have smoked or chewed Tobacco  in the last 2 yrs please stop smoking, stop any regular Alcohol  and or any Recreational drug use.  Wear Seat belts while driving.  Please note  You were cared for by a hospitalist during your hospital stay. Once you are discharged, your primary care physician will handle any further medical issues. Please note that NO REFILLS for any discharge medications will be authorized once you are discharged, as it is imperative that you return to your primary care physician (or establish a relationship with a primary care physician if you do not have one) for your aftercare needs so that they can reassess your need for medications and monitor your lab values.   Diet recommendation: Heart Healthy diet  Discharge Instructions    Diet - low sodium heart healthy    Complete by:  As directed  Discharge instructions    Complete by:  As directed   Keep in Aspen collar     Increase activity slowly    Complete by:  As directed            Follow-up Information    Follow up with Karn Cassis, MD. Schedule an appointment as soon as possible for a visit in 1 week.   Specialty:  Neurosurgery   Why:  Hospital follow up   Contact information:   [address] [place] [address] [phone]       Schedule an appointment as soon as possible for a visit in 1 week to follow up.   Contact information:   Primary Neurologist      Schedule an appointment as soon as possible  for a visit in 1 week to follow up.   Contact information:   Primary MD      Follow up with [facility] .   Specialty:  Skilled Nursing Facility   Contact information:   [address] [place] [address] [phone]      Total Time spent on discharge equals  45 minutes.  SignedJeoffrey Massed 04/18/2015 11:40 AM

## 2015-04-18 NOTE — Care Management Important Message (Signed)
Important Message  Patient Details  Name: Martin Powell MRN: [id number] Date of Birth: 18-Nov-1945   Medicare Important Message Given:  Yes    Bernadette Hoit 04/18/2015, 12:52 PM

## 2015-04-18 NOTE — Progress Notes (Addendum)
Nsg Discharge Note  Admit Date:  04/13/2015 Discharge date: 04/18/2015   Martin Powell to be D/C'd Rehab per MD order.  Aspen Collar applied before DC. AVS completed.  Copy for chart, and copy for patient signed, and dated. Patient/caregiver able to verbalize understanding.  Discharge Medication:   Medication List    STOP taking these medications        aspirin 81 MG tablet      TAKE these medications        acetaminophen 325 MG tablet  Commonly known as:  TYLENOL  Take 325 mg by mouth every 6 (six) hours as needed for mild pain or moderate pain.     atorvastatin 80 MG tablet  Commonly known as:  LIPITOR  Take 80 mg by mouth daily.     diltiazem 120 MG 24 hr capsule  Commonly known as:  TIAZAC  Take 120 mg by mouth daily.     doxycycline 100 MG EC tablet  Commonly known as:  DORYX  Take 1 tablet (100 mg total) by mouth 2 (two) times daily. 4 more days from 04/18/15     furosemide 20 MG tablet  Commonly known as:  LASIX  Take 1 tablet (20 mg total) by mouth daily.     gabapentin 300 MG capsule  Commonly known as:  NEURONTIN  Take 1 capsule (300 mg total) by mouth 3 (three) times daily.     HYDROcodone-acetaminophen 5-325 MG tablet  Commonly known as:  NORCO/VICODIN  Take 1 tablet by mouth every 6 (six) hours as needed.     levothyroxine 112 MCG tablet  Commonly known as:  SYNTHROID, LEVOTHROID  Take 112 mcg by mouth daily before breakfast.     pantoprazole 40 MG tablet  Commonly known as:  PROTONIX  Take 40 mg by mouth at bedtime. To prevent acid reflux or bleeding (while on Xarelto).     potassium chloride SA 20 MEQ tablet  Commonly known as:  K-DUR,KLOR-CON  Take 1 tablet (20 mEq total) by mouth daily.     rivaroxaban 20 MG Tabs tablet  Commonly known as:  XARELTO  Take 1 tablet (20 mg total) by mouth daily with supper.     sotalol 80 MG tablet  Commonly known as:  BETAPACE  Take 40 mg by mouth 2 (two) times daily.     TECFIDERA 240 MG Cpdr  Generic  drug:  Dimethyl Fumarate  Take 240 mg by mouth 2 (two) times daily.     Vitamin D3 2000 units Tabs  Take 1 tablet by mouth daily.        Discharge Assessment: Filed Vitals:   04/18/15 0424 04/18/15 1149  BP: 149/78 156/77  Pulse: 58 54  Temp: 97.9 F (36.6 C) 97.8 F (36.6 C)  Resp: 18   Skin clean, dry and intact without evidence of skin break down, no evidence of skin tears noted. IV catheter discontinued intact. Site without signs and symptoms of complications - no redness or edema noted at insertion site, patient denies c/o pain - only slight tenderness at site.  Dressing with slight pressure applied.  D/c Instructions-Education: Discharge instructions given to patient/family with verbalized understanding. D/c education completed with patient/family including follow up instructions, medication list, d/c activities limitations if indicated, with other d/c instructions as indicated by MD - patient able to verbalize understanding, all questions fully answered. Patient instructed to return to ED, call 911, or call MD for any changes in condition.  Patient transported via [facility]  to [facility] for rehab. Attempted to give report 3 times. Each time the phone# [phone] was busy.   Camillo Flaming, RN 04/18/2015 11:52 AM

## 2015-04-18 NOTE — Clinical Social Work Placement (Signed)
   CLINICAL SOCIAL WORK PLACEMENT  NOTE  Date:  04/18/2015  Patient Details  Name: Martin Powell MRN: [id number] Date of Birth: [birth year]  Clinical Social Work is seeking post-discharge placement for this patient at the Skilled  Nursing Facility level of care (*CSW will initial, date and re-position this form in  chart as items are completed):  Yes   Patient/family provided with [facility] Clinical Social Work Department's list of facilities offering this level of care within the geographic area requested by the patient (or if unable, by the patient's family).  Yes   Patient/family informed of their freedom to choose among providers that offer the needed level of care, that participate in Medicare, Medicaid or managed care program needed by the patient, have an available bed and are willing to accept the patient.  Yes   Patient/family informed of [facility]'s ownership interest in [facility] and [facility], as well as of the fact that they are under no obligation to receive care at these facilities.  PASRR submitted to EDS on 04/17/15     PASRR number received on 04/17/15     Existing PASRR number confirmed on       FL2 transmitted to all facilities in geographic area requested by pt/family on 04/17/15     FL2 transmitted to all facilities within larger geographic area on       Patient informed that his/her managed care company has contracts with or will negotiate with certain facilities, including the following:        Yes   Patient/family informed of bed offers received.  Patient chooses bed at [facility]     Physician recommends and patient chooses bed at      Patient to be transferred to [facility] on 04/18/15.  Patient to be transferred to facility by [facility]     Patient family notified on 04/18/15 of transfer.  Name of family member notified:  Annice Pih, sister     PHYSICIAN       Additional Comment:     _______________________________________________ Mearl Latin, LCSWA 04/18/2015, 11:04 AM

## 2015-04-19 LAB — CULTURE, BLOOD (ROUTINE X 2)
Culture: NO GROWTH
Culture: NO GROWTH

## 2015-05-16 ENCOUNTER — Emergency Department (HOSPITAL_BASED_OUTPATIENT_CLINIC_OR_DEPARTMENT_OTHER)
Admission: EM | Admit: 2015-05-16 | Discharge: 2015-05-16 | Disposition: A | Discharge status: Home / Self Care | Payer: Non-veteran care | Attending: Emergency Medicine | Admitting: Emergency Medicine

## 2015-05-16 ENCOUNTER — Encounter (HOSPITAL_BASED_OUTPATIENT_CLINIC_OR_DEPARTMENT_OTHER): Payer: Self-pay | Admitting: *Deleted

## 2015-05-16 DIAGNOSIS — Z79899 Other long term (current) drug therapy: Secondary | ICD-10-CM | POA: Insufficient documentation

## 2015-05-16 DIAGNOSIS — L03119 Cellulitis of unspecified part of limb: Secondary | ICD-10-CM

## 2015-05-16 DIAGNOSIS — E7800 Pure hypercholesterolemia, unspecified: Secondary | ICD-10-CM | POA: Insufficient documentation

## 2015-05-16 DIAGNOSIS — R011 Cardiac murmur, unspecified: Secondary | ICD-10-CM | POA: Diagnosis not present

## 2015-05-16 DIAGNOSIS — L03115 Cellulitis of right lower limb: Secondary | ICD-10-CM | POA: Diagnosis not present

## 2015-05-16 DIAGNOSIS — K219 Gastro-esophageal reflux disease without esophagitis: Secondary | ICD-10-CM | POA: Insufficient documentation

## 2015-05-16 DIAGNOSIS — I10 Essential (primary) hypertension: Secondary | ICD-10-CM | POA: Diagnosis not present

## 2015-05-16 DIAGNOSIS — L03116 Cellulitis of left lower limb: Secondary | ICD-10-CM | POA: Insufficient documentation

## 2015-05-16 DIAGNOSIS — E039 Hypothyroidism, unspecified: Secondary | ICD-10-CM | POA: Insufficient documentation

## 2015-05-16 DIAGNOSIS — M79605 Pain in left leg: Secondary | ICD-10-CM | POA: Diagnosis present

## 2015-05-16 DIAGNOSIS — G35 Multiple sclerosis: Secondary | ICD-10-CM | POA: Insufficient documentation

## 2015-05-16 LAB — BASIC METABOLIC PANEL
ANION GAP: 6 (ref 5–15)
BUN: 15 mg/dL (ref 6–20)
CHLORIDE: 106 mmol/L (ref 101–111)
CO2: 31 mmol/L (ref 22–32)
Calcium: 9.1 mg/dL (ref 8.9–10.3)
Creatinine, Ser: 0.89 mg/dL (ref 0.61–1.24)
GFR calc Af Amer: >60 mL/min (ref >60)
GFR calc non Af Amer: >60 mL/min (ref >60)
GLUCOSE: 85 mg/dL (ref 65–99)
POTASSIUM: 4.4 mmol/L (ref 3.5–5.1)
Sodium: 143 mmol/L (ref 135–145)

## 2015-05-16 LAB — CBC WITH DIFFERENTIAL/PLATELET
Basophils Absolute: 0 10*3/uL (ref 0.0–0.1)
Basophils Relative: 1 %
Eosinophils Absolute: 0.5 10*3/uL (ref 0.0–0.7)
Eosinophils Relative: 8 %
HEMATOCRIT: 37 % — AB (ref 39.0–52.0)
HEMOGLOBIN: 11.5 g/dL — AB (ref 13.0–17.0)
LYMPHS PCT: 14 %
Lymphs Abs: 0.9 10*3/uL (ref 0.7–4.0)
MCH: 27.1 pg (ref 26.0–34.0)
MCHC: 31.1 g/dL (ref 30.0–36.0)
MCV: 87.3 fL (ref 78.0–100.0)
MONO ABS: 0.7 10*3/uL (ref 0.1–1.0)
MONOS PCT: 11 %
NEUTROS ABS: 4.4 10*3/uL (ref 1.7–7.7)
Neutrophils Relative %: 66 %
Platelets: 183 10*3/uL (ref 150–400)
RBC: 4.24 MIL/uL (ref 4.22–5.81)
RDW: 15.3 % (ref 11.5–15.5)
WBC: 6.5 10*3/uL (ref 4.0–10.5)

## 2015-05-16 MED ORDER — CEPHALEXIN 500 MG PO CAPS: 500.0000 mg | ORAL_CAPSULE | Freq: Four times a day (QID) | ORAL | Status: DC

## 2015-05-16 MED ORDER — SULFAMETHOXAZOLE-TRIMETHOPRIM 800-160 MG PO TABS: 1.0000 | ORAL_TABLET | Freq: Two times a day (BID) | ORAL | Status: AC

## 2015-05-16 MED ORDER — DEXTROSE 5 % IV SOLN
1.0000 g | Freq: Once | INTRAVENOUS | Status: AC
  Administered 2015-05-16: 1 g via INTRAVENOUS
  Filled 2015-05-16: qty 10

## 2015-05-16 MED FILL — SULFAMETHOXAZOLE-TMP DS TAB: 800-160 | 10 days supply | Qty: 20 | Fill #0

## 2015-05-16 MED FILL — CEPHALEXIN 500 MG CAPSULE: 500 | 10 days supply | Qty: 40 | Fill #0

## 2015-05-16 NOTE — ED Notes (Signed)
Pain in both his lower legs. Swelling and ulcers on his legs that drain. He was admitted for cellulitis 2 weeks ago.

## 2015-05-16 NOTE — ED Provider Notes (Signed)
CSN: [id number]     Arrival date & time 05/16/15  1438 History  By signing my name below, I, Martin Powell, attest that this documentation has been prepared under the direction and in the presence of Martin Lyons, MD. Electronically Signed: Linus Powell, ED Scribe. 05/16/2015. 3:49 PM.   Chief Complaint  Patient presents with  . Leg Pain   The history is provided by the patient and a relative. No language interpreter was used.   HPI Comments: Martin Powell is a 70 y.o. male with a PMHx of cellulitis, HTN, HLD, and MS who presents to the Emergency Department complaining of BLE pain that worsened over the past two weeks. Pt sister states the pt is having reoccurring cellulitis with BLE swelling and redness. She states that the pts home health nurse who suggested that the pt should be on an antibiotic for her suspicion of an infection. Pt was admitted to [facility] for similar symptoms 2 weeks ago and completed an antibiotic regimen while in the hospital. Pt denies any fever, or any other sx at this time.  Past Medical History  Diagnosis Date  . Hypertension   . GERD (gastroesophageal reflux disease)   . Hypercholesteremia   . Thyroid activity decreased   . Multiple sclerosis (HCC)   . Murmur, heart    Past Surgical History  Procedure Laterality Date  . Appendectomy     Family History  Problem Relation Age of Onset  . CAD Neg Hx    Social History  Substance Use Topics  . Smoking status: Never Smoker   . Smokeless tobacco: None  . Alcohol Use: No    Review of Systems  Constitutional: Negative for fever.  Musculoskeletal:       + BLE pain  Skin: Positive for color change.  All other systems reviewed and are negative.  Allergies  Review of patient's allergies indicates no known allergies.  Home Medications   Prior to Admission medications   Medication Sig Start Date End Date Taking? Authorizing Provider  acetaminophen (TYLENOL) 325 MG tablet Take 325 mg by mouth every 6  (six) hours as needed for mild pain or moderate pain.    Historical Provider, MD  atorvastatin (LIPITOR) 80 MG tablet Take 80 mg by mouth daily.    Historical Provider, MD  Cholecalciferol (VITAMIN D3) 2000 UNITS TABS Take 1 tablet by mouth daily.    Historical Provider, MD  diltiazem (TIAZAC) 120 MG 24 hr capsule Take 120 mg by mouth daily.    Historical Provider, MD  Dimethyl Fumarate (TECFIDERA) 240 MG CPDR Take 240 mg by mouth 2 (two) times daily.     Historical Provider, MD  doxycycline (DORYX) 100 MG EC tablet Take 1 tablet (100 mg total) by mouth 2 (two) times daily. 4 more days from 04/18/15 04/18/15   Maretta Bees, MD  furosemide (LASIX) 20 MG tablet Take 1 tablet (20 mg total) by mouth daily. 04/18/15   Shanker Levora Dredge, MD  gabapentin (NEURONTIN) 300 MG capsule Take 1 capsule (300 mg total) by mouth 3 (three) times daily. 04/18/15   Shanker Levora Dredge, MD  HYDROcodone-acetaminophen (NORCO/VICODIN) 5-325 MG tablet Take 1 tablet by mouth every 6 (six) hours as needed. 04/18/15   Shanker Levora Dredge, MD  levothyroxine (SYNTHROID, LEVOTHROID) 112 MCG tablet Take 112 mcg by mouth daily before breakfast.    Historical Provider, MD  pantoprazole (PROTONIX) 40 MG tablet Take 40 mg by mouth at bedtime. To prevent acid reflux or bleeding (while  on Xarelto).    Historical Provider, MD  potassium chloride SA (K-DUR,KLOR-CON) 20 MEQ tablet Take 1 tablet (20 mEq total) by mouth daily. 04/18/15   Shanker Levora Dredge, MD  Rivaroxaban (XARELTO) 20 MG TABS tablet Take 1 tablet (20 mg total) by mouth daily with supper. 04/18/15   Shanker Levora Dredge, MD  sotalol (BETAPACE) 80 MG tablet Take 40 mg by mouth 2 (two) times daily.     Historical Provider, MD   BP 171/74 mmHg  Pulse 59  Temp(Src) 98.4 F (36.9 C) (Oral)  Resp 20  Ht  (1.905 m)  Wt 230 lb (104.327 kg)  BMI 28.75 kg/m2  SpO2 100%   Physical Exam  Constitutional: He is oriented to person, place, and time. He appears well-developed and  well-nourished. No distress.  HENT:  Head: Normocephalic and atraumatic.  Mouth/Throat: Oropharynx is clear and moist. No oropharyngeal exudate.  Eyes: Conjunctivae and EOM are normal. Pupils are equal, round, and reactive to light.  Neck: Normal range of motion. Neck supple.  No meningismus.  Cardiovascular: Normal rate, regular rhythm, normal heart sounds and intact distal pulses.   No murmur heard. Pulmonary/Chest: Effort normal and breath sounds normal. No respiratory distress.  Abdominal: Soft. There is no tenderness. There is no rebound and no guarding.  Musculoskeletal: Normal range of motion. He exhibits no edema or tenderness.  Neurological: He is alert and oriented to person, place, and time. No cranial nerve deficit. He exhibits normal muscle tone. Coordination normal.  No ataxia on finger to nose bilaterally. No pronator drift. 5/5 strength throughout. CN 2-12 intact.Equal grip strength. Sensation intact.   Skin: Skin is warm.  BLE have swelling, warmth, and erythema in a stocking distribution. There are areas of skin breakdown to the anterior aspect of both shins. Distal pulse and cap refill intact. No calf tenderness.   Psychiatric: He has a normal mood and affect. His behavior is normal.  Nursing note and vitals reviewed.   ED Course  Procedures   DIAGNOSTIC STUDIES: Oxygen Saturation is 100% on room air, normal by my interpretation.    COORDINATION OF CARE: 3:40 PM Discussed treatment plan with pt at bedside and pt agreed to plan.  Labs Review Labs Reviewed  CBC WITH DIFFERENTIAL/PLATELET - Abnormal; Notable for the following:    Hemoglobin 11.5 (*)    HCT 37.0 (*)    All other components within normal limits  BASIC METABOLIC PANEL    Imaging Review No results found. I have personally reviewed and evaluated these images and lab results as part of my medical decision-making.    MDM   Final diagnoses:  None    On physical exam, this patient appears to  have bilateral cellulitis. There is redness, warmth, and swelling to both legs in a stocking distribution. He has no fever, vitals are stable, there is no elevation of white count. Patient has been admitted recently for cellulitis which required IV antibiotics, however is adamant that he not be admitted again. He is hoping for outpatient therapy. He was given IV ceftriaxone and will be discharged with Keflex and Bactrim. He is not toxic or septic appearing and nothing in his vitals or lab work suggests a septic situation. He understands to return if he develops high fever, increasing pain or redness, or other new and concerning symptoms.  I personally performed the services described in this documentation, which was scribed in my presence. The recorded information has been reviewed and is accurate.  Martin Lyons, MD 05/16/15 1750

## 2015-05-16 NOTE — Discharge Instructions (Signed)
Keflex and Bactrim as prescribed.  Return to the emergency department if you develop increasing pain, increasing redness, high fever, chills, weakness, or other new and concerning symptoms.   Cellulitis Cellulitis is an infection of the skin and the tissue beneath it. The infected area is usually red and tender. Cellulitis occurs most often in the arms and lower legs.  CAUSES  Cellulitis is caused by bacteria that enter the skin through cracks or cuts in the skin. The most common types of bacteria that cause cellulitis are staphylococci and streptococci. SIGNS AND SYMPTOMS   Redness and warmth.  Swelling.  Tenderness or pain.  Fever. DIAGNOSIS  Your health care provider can usually determine what is wrong based on a physical exam. Blood tests may also be done. TREATMENT  Treatment usually involves taking an antibiotic medicine. HOME CARE INSTRUCTIONS   Take your antibiotic medicine as directed by your health care provider. Finish the antibiotic even if you start to feel better.  Keep the infected arm or leg elevated to reduce swelling.  Apply a warm cloth to the affected area up to 4 times per day to relieve pain.  Take medicines only as directed by your health care provider.  Keep all follow-up visits as directed by your health care provider. SEEK MEDICAL CARE IF:   You notice red streaks coming from the infected area.  Your red area gets larger or turns dark in color.  Your bone or joint underneath the infected area becomes painful after the skin has healed.  Your infection returns in the same area or another area.  You notice a swollen bump in the infected area.  You develop new symptoms.  You have a fever. SEEK IMMEDIATE MEDICAL CARE IF:   You feel very sleepy.  You develop vomiting or diarrhea.  You have a general ill feeling (malaise) with muscle aches and pains.   This information is not intended to replace advice given to you by your health care  provider. Make sure you discuss any questions you have with your health care provider.   Document Released: 12/27/2004 Document Revised: 12/08/2014 Document Reviewed: 06/04/2011 Elsevier Interactive Patient Education [employer]

## 2015-12-07 ENCOUNTER — Encounter (HOSPITAL_BASED_OUTPATIENT_CLINIC_OR_DEPARTMENT_OTHER): Payer: Self-pay | Admitting: Emergency Medicine

## 2015-12-07 ENCOUNTER — Emergency Department (HOSPITAL_BASED_OUTPATIENT_CLINIC_OR_DEPARTMENT_OTHER): Payer: Medicare Other

## 2015-12-07 ENCOUNTER — Inpatient Hospital Stay (HOSPITAL_BASED_OUTPATIENT_CLINIC_OR_DEPARTMENT_OTHER)
Admission: EM | Admit: 2015-12-07 | Discharge: 2015-12-10 | DRG: 871 | Disposition: A | Discharge status: Skilled Nursing Facility | Payer: Medicare Other | Attending: Family Medicine | Admitting: Family Medicine

## 2015-12-07 DIAGNOSIS — R7989 Other specified abnormal findings of blood chemistry: Secondary | ICD-10-CM | POA: Diagnosis present

## 2015-12-07 DIAGNOSIS — A419 Sepsis, unspecified organism: Principal | ICD-10-CM | POA: Diagnosis present

## 2015-12-07 DIAGNOSIS — E7800 Pure hypercholesterolemia, unspecified: Secondary | ICD-10-CM | POA: Diagnosis present

## 2015-12-07 DIAGNOSIS — L03116 Cellulitis of left lower limb: Secondary | ICD-10-CM | POA: Diagnosis present

## 2015-12-07 DIAGNOSIS — R531 Weakness: Secondary | ICD-10-CM

## 2015-12-07 DIAGNOSIS — Z7901 Long term (current) use of anticoagulants: Secondary | ICD-10-CM | POA: Diagnosis not present

## 2015-12-07 DIAGNOSIS — G35 Multiple sclerosis: Secondary | ICD-10-CM | POA: Diagnosis present

## 2015-12-07 DIAGNOSIS — I480 Paroxysmal atrial fibrillation: Secondary | ICD-10-CM | POA: Diagnosis present

## 2015-12-07 DIAGNOSIS — R011 Cardiac murmur, unspecified: Secondary | ICD-10-CM | POA: Insufficient documentation

## 2015-12-07 DIAGNOSIS — Z8673 Personal history of transient ischemic attack (TIA), and cerebral infarction without residual deficits: Secondary | ICD-10-CM | POA: Diagnosis not present

## 2015-12-07 DIAGNOSIS — I5032 Chronic diastolic (congestive) heart failure: Secondary | ICD-10-CM | POA: Diagnosis present

## 2015-12-07 DIAGNOSIS — L0390 Cellulitis, unspecified: Secondary | ICD-10-CM | POA: Diagnosis present

## 2015-12-07 DIAGNOSIS — Z9181 History of falling: Secondary | ICD-10-CM

## 2015-12-07 DIAGNOSIS — I5030 Unspecified diastolic (congestive) heart failure: Secondary | ICD-10-CM | POA: Diagnosis present

## 2015-12-07 DIAGNOSIS — T501X5A Adverse effect of loop [high-ceiling] diuretics, initial encounter: Secondary | ICD-10-CM | POA: Diagnosis present

## 2015-12-07 DIAGNOSIS — R29898 Other symptoms and signs involving the musculoskeletal system: Secondary | ICD-10-CM | POA: Diagnosis present

## 2015-12-07 DIAGNOSIS — L03119 Cellulitis of unspecified part of limb: Secondary | ICD-10-CM

## 2015-12-07 DIAGNOSIS — L03115 Cellulitis of right lower limb: Secondary | ICD-10-CM | POA: Diagnosis present

## 2015-12-07 DIAGNOSIS — G35D Multiple sclerosis, unspecified: Secondary | ICD-10-CM | POA: Diagnosis present

## 2015-12-07 DIAGNOSIS — Z7982 Long term (current) use of aspirin: Secondary | ICD-10-CM

## 2015-12-07 DIAGNOSIS — R778 Other specified abnormalities of plasma proteins: Secondary | ICD-10-CM | POA: Diagnosis present

## 2015-12-07 DIAGNOSIS — I509 Heart failure, unspecified: Secondary | ICD-10-CM | POA: Diagnosis not present

## 2015-12-07 DIAGNOSIS — R509 Fever, unspecified: Secondary | ICD-10-CM | POA: Insufficient documentation

## 2015-12-07 DIAGNOSIS — L8990 Pressure ulcer of unspecified site, unspecified stage: Secondary | ICD-10-CM | POA: Diagnosis present

## 2015-12-07 DIAGNOSIS — E876 Hypokalemia: Secondary | ICD-10-CM | POA: Diagnosis present

## 2015-12-07 DIAGNOSIS — J189 Pneumonia, unspecified organism: Secondary | ICD-10-CM | POA: Diagnosis present

## 2015-12-07 DIAGNOSIS — L89151 Pressure ulcer of sacral region, stage 1: Secondary | ICD-10-CM | POA: Diagnosis present

## 2015-12-07 DIAGNOSIS — I110 Hypertensive heart disease with heart failure: Secondary | ICD-10-CM | POA: Diagnosis present

## 2015-12-07 DIAGNOSIS — E039 Hypothyroidism, unspecified: Secondary | ICD-10-CM | POA: Diagnosis present

## 2015-12-07 DIAGNOSIS — K219 Gastro-esophageal reflux disease without esophagitis: Secondary | ICD-10-CM | POA: Diagnosis present

## 2015-12-07 DIAGNOSIS — Z8701 Personal history of pneumonia (recurrent): Secondary | ICD-10-CM

## 2015-12-07 HISTORY — DX: Pneumonia, unspecified organism: J18.9

## 2015-12-07 HISTORY — DX: Cerebral infarction, unspecified: I63.9

## 2015-12-07 HISTORY — DX: Cellulitis, unspecified: L03.90

## 2015-12-07 HISTORY — DX: Heart failure, unspecified: I50.9

## 2015-12-07 LAB — URINALYSIS, ROUTINE W REFLEX MICROSCOPIC
Bilirubin Urine: NEGATIVE
Glucose, UA: NEGATIVE mg/dL
Ketones, ur: 15 mg/dL — AB
Leukocytes, UA: NEGATIVE
Nitrite: NEGATIVE
Protein, ur: NEGATIVE mg/dL
Specific Gravity, Urine: 1.021 (ref 1.005–1.030)
pH: 5.5 (ref 5.0–8.0)

## 2015-12-07 LAB — CBC WITH DIFFERENTIAL/PLATELET
Basophils Absolute: 0 10*3/uL (ref 0.0–0.1)
Basophils Absolute: 0 10*3/uL (ref 0.0–0.1)
Basophils Relative: 0 %
Basophils Relative: 0 %
EOS PCT: 1 %
EOS PCT: 0 %
Eosinophils Absolute: 0.1 10*3/uL (ref 0.0–0.7)
Eosinophils Absolute: 0 10*3/uL (ref 0.0–0.7)
HCT: 38.9 % — ABNORMAL LOW (ref 39.0–52.0)
HEMATOCRIT: 40.3 % (ref 39.0–52.0)
Hemoglobin: 13.6 g/dL (ref 13.0–17.0)
Hemoglobin: 12.1 g/dL — ABNORMAL LOW (ref 13.0–17.0)
LYMPHS ABS: 0.4 10*3/uL — AB (ref 0.7–4.0)
LYMPHS ABS: 0.6 10*3/uL — AB (ref 0.7–4.0)
LYMPHS PCT: 3 %
LYMPHS PCT: 5 %
MCH: 29.1 pg (ref 26.0–34.0)
MCH: 27.8 pg (ref 26.0–34.0)
MCHC: 33.7 g/dL (ref 30.0–36.0)
MCHC: 31.1 g/dL (ref 30.0–36.0)
MCV: 86.1 fL (ref 78.0–100.0)
MCV: 89.2 fL (ref 78.0–100.0)
MONO ABS: 1 10*3/uL (ref 0.1–1.0)
Monocytes Absolute: 1.1 10*3/uL — ABNORMAL HIGH (ref 0.1–1.0)
Monocytes Relative: 7 %
Monocytes Relative: 8 %
NEUTROS ABS: 13.4 10*3/uL — AB (ref 1.7–7.7)
Neutro Abs: 10.8 10*3/uL — ABNORMAL HIGH (ref 1.7–7.7)
Neutrophils Relative %: 89 %
Neutrophils Relative %: 87 %
PLATELETS: 173 10*3/uL (ref 150–400)
PLATELETS: 163 10*3/uL (ref 150–400)
RBC: 4.68 MIL/uL (ref 4.22–5.81)
RBC: 4.36 MIL/uL (ref 4.22–5.81)
RDW: 14.7 % (ref 11.5–15.5)
RDW: 14.6 % (ref 11.5–15.5)
WBC: 15.1 10*3/uL — AB (ref 4.0–10.5)
WBC: 12.4 10*3/uL — AB (ref 4.0–10.5)

## 2015-12-07 LAB — COMPREHENSIVE METABOLIC PANEL WITH GFR
ALT: 18 U/L (ref 17–63)
AST: 25 U/L (ref 15–41)
Albumin: 4.4 g/dL (ref 3.5–5.0)
Alkaline Phosphatase: 80 U/L (ref 38–126)
Anion gap: 10 (ref 5–15)
BUN: 25 mg/dL — ABNORMAL HIGH (ref 6–20)
CO2: 25 mmol/L (ref 22–32)
Calcium: 9 mg/dL (ref 8.9–10.3)
Chloride: 105 mmol/L (ref 101–111)
Creatinine, Ser: 1.13 mg/dL (ref 0.61–1.24)
GFR calc Af Amer: >60 mL/min
GFR calc non Af Amer: >60 mL/min
Glucose, Bld: 155 mg/dL — ABNORMAL HIGH (ref 65–99)
Potassium: 3.5 mmol/L (ref 3.5–5.1)
Sodium: 140 mmol/L (ref 135–145)
Total Bilirubin: 1.4 mg/dL — ABNORMAL HIGH (ref 0.3–1.2)
Total Protein: 7.5 g/dL (ref 6.5–8.1)

## 2015-12-07 LAB — TROPONIN I: Troponin I: 0.06 ng/mL (ref <0.03)

## 2015-12-07 LAB — I-STAT CG4 LACTIC ACID, ED
Lactic Acid, Venous: 1.4 mmol/L (ref 0.5–1.9)
Lactic Acid, Venous: 0.95 mmol/L (ref 0.5–1.9)

## 2015-12-07 LAB — PROCALCITONIN: Procalcitonin: 0.16 ng/mL

## 2015-12-07 LAB — COMPREHENSIVE METABOLIC PANEL
ALT: 16 U/L — AB (ref 17–63)
AST: 24 U/L (ref 15–41)
Albumin: 3.7 g/dL (ref 3.5–5.0)
Alkaline Phosphatase: 71 U/L (ref 38–126)
Anion gap: 7 (ref 5–15)
BUN: 18 mg/dL (ref 6–20)
CALCIUM: 9.1 mg/dL (ref 8.9–10.3)
CHLORIDE: 109 mmol/L (ref 101–111)
CO2: 29 mmol/L (ref 22–32)
Creatinine, Ser: 1.11 mg/dL (ref 0.61–1.24)
Glucose, Bld: 141 mg/dL — ABNORMAL HIGH (ref 65–99)
Potassium: 4.2 mmol/L (ref 3.5–5.1)
Sodium: 145 mmol/L (ref 135–145)
TOTAL PROTEIN: 6.8 g/dL (ref 6.5–8.1)
Total Bilirubin: 1.5 mg/dL — ABNORMAL HIGH (ref 0.3–1.2)

## 2015-12-07 LAB — PROTIME-INR
INR: 1.9
Prothrombin Time: 22 seconds — ABNORMAL HIGH (ref 11.4–15.2)

## 2015-12-07 LAB — LACTIC ACID, PLASMA
LACTIC ACID, VENOUS: 1.7 mmol/L (ref 0.5–1.9)
LACTIC ACID, VENOUS: 1.2 mmol/L (ref 0.5–1.9)

## 2015-12-07 LAB — TSH: TSH: 0.735 u[IU]/mL (ref 0.350–4.500)

## 2015-12-07 LAB — URINE MICROSCOPIC-ADD ON

## 2015-12-07 LAB — APTT: aPTT: 34 seconds (ref 24–36)

## 2015-12-07 MED ORDER — VANCOMYCIN HCL IN DEXTROSE 1-5 GM/200ML-% IV SOLN
1000.0000 mg | Freq: Once | INTRAVENOUS | Status: AC
  Administered 2015-12-07: 1000 mg via INTRAVENOUS
  Filled 2015-12-07: qty 200

## 2015-12-07 MED ORDER — DIMETHYL FUMARATE 240 MG PO CPDR
240.0000 mg | DELAYED_RELEASE_CAPSULE | Freq: Two times a day (BID) | ORAL | Status: DC
  Administered 2015-12-07 – 2015-12-10 (×6): 240 mg via ORAL
  Filled 2015-12-07 (×5): qty 1

## 2015-12-07 MED ORDER — BISACODYL 5 MG PO TBEC: 5.0000 mg | DELAYED_RELEASE_TABLET | Freq: Every day | ORAL | Status: DC | PRN

## 2015-12-07 MED ORDER — VANCOMYCIN HCL IN DEXTROSE 750-5 MG/150ML-% IV SOLN
750.0000 mg | Freq: Two times a day (BID) | INTRAVENOUS | Status: DC
  Administered 2015-12-08 – 2015-12-10 (×6): 750 mg via INTRAVENOUS
  Filled 2015-12-07 (×7): qty 150

## 2015-12-07 MED ORDER — ACETAMINOPHEN 325 MG PO TABS: 325.0000 mg | ORAL_TABLET | Freq: Four times a day (QID) | ORAL | Status: DC | PRN

## 2015-12-07 MED ORDER — ONDANSETRON HCL 4 MG/2ML IJ SOLN: 4.0000 mg | Freq: Four times a day (QID) | INTRAMUSCULAR | Status: DC | PRN

## 2015-12-07 MED ORDER — SOTALOL HCL 80 MG PO TABS
40.0000 mg | ORAL_TABLET | Freq: Two times a day (BID) | ORAL | Status: DC
  Administered 2015-12-07 – 2015-12-09 (×4): 40 mg via ORAL
  Filled 2015-12-07 (×5): qty 0.5

## 2015-12-07 MED ORDER — PIPERACILLIN-TAZOBACTAM 3.375 G IVPB 30 MIN: 3.3750 g | Freq: Once | INTRAVENOUS | Status: DC

## 2015-12-07 MED ORDER — VITAMIN D3 50 MCG (2000 UT) PO TABS: 4000.0000 [IU] | ORAL_TABLET | Freq: Every day | ORAL | Status: DC

## 2015-12-07 MED ORDER — RIVAROXABAN 15 MG PO TABS
15.0000 mg | ORAL_TABLET | Freq: Every day | ORAL | Status: DC
  Administered 2015-12-07: 15 mg via ORAL
  Filled 2015-12-07: qty 1

## 2015-12-07 MED ORDER — FUROSEMIDE 40 MG PO TABS
40.0000 mg | ORAL_TABLET | Freq: Two times a day (BID) | ORAL | Status: DC
  Administered 2015-12-07 – 2015-12-10 (×6): 40 mg via ORAL
  Filled 2015-12-07 (×6): qty 1

## 2015-12-07 MED ORDER — LISINOPRIL 20 MG PO TABS
20.0000 mg | ORAL_TABLET | Freq: Every day | ORAL | Status: DC
  Administered 2015-12-07 – 2015-12-10 (×4): 20 mg via ORAL
  Filled 2015-12-07 (×4): qty 1

## 2015-12-07 MED ORDER — DILTIAZEM HCL ER COATED BEADS 120 MG PO CP24
120.0000 mg | ORAL_CAPSULE | Freq: Every day | ORAL | Status: DC
  Administered 2015-12-07 – 2015-12-10 (×4): 120 mg via ORAL
  Filled 2015-12-07 (×6): qty 1

## 2015-12-07 MED ORDER — ONDANSETRON HCL 4 MG PO TABS: 4.0000 mg | ORAL_TABLET | Freq: Four times a day (QID) | ORAL | Status: DC | PRN

## 2015-12-07 MED ORDER — ASPIRIN EC 81 MG PO TBEC
81.0000 mg | DELAYED_RELEASE_TABLET | Freq: Every day | ORAL | Status: DC
  Administered 2015-12-07 – 2015-12-10 (×4): 81 mg via ORAL
  Filled 2015-12-07 (×4): qty 1

## 2015-12-07 MED ORDER — SODIUM CHLORIDE 0.9 % IV BOLUS (SEPSIS)
1000.0000 mL | Freq: Once | INTRAVENOUS | Status: AC
  Administered 2015-12-07: 1000 mL via INTRAVENOUS

## 2015-12-07 MED ORDER — GABAPENTIN 300 MG PO CAPS
300.0000 mg | ORAL_CAPSULE | Freq: Three times a day (TID) | ORAL | Status: DC
  Administered 2015-12-07 – 2015-12-10 (×10): 300 mg via ORAL
  Filled 2015-12-07 (×10): qty 1

## 2015-12-07 MED ORDER — PANTOPRAZOLE SODIUM 40 MG PO TBEC: 40.0000 mg | DELAYED_RELEASE_TABLET | Freq: Every day | ORAL | Status: DC | PRN

## 2015-12-07 MED ORDER — PIPERACILLIN-TAZOBACTAM 3.375 G IVPB
3.3750 g | Freq: Three times a day (TID) | INTRAVENOUS | Status: DC
Start: 2015-12-07 — End: 2015-12-09
  Administered 2015-12-07 – 2015-12-09 (×6): 3.375 g via INTRAVENOUS
  Filled 2015-12-07 (×9): qty 50

## 2015-12-07 MED ORDER — VANCOMYCIN HCL IN DEXTROSE 1-5 GM/200ML-% IV SOLN: 1000.0000 mg | Freq: Once | INTRAVENOUS | Status: DC

## 2015-12-07 MED ORDER — LEVOTHYROXINE SODIUM 100 MCG PO TABS
100.0000 ug | ORAL_TABLET | Freq: Every day | ORAL | Status: DC
  Administered 2015-12-07 – 2015-12-10 (×4): 100 ug via ORAL
  Filled 2015-12-07 (×4): qty 1

## 2015-12-07 MED ORDER — SODIUM CHLORIDE 0.45 % IV SOLN
INTRAVENOUS | Status: DC
  Administered 2015-12-07 – 2015-12-08 (×2): via INTRAVENOUS

## 2015-12-07 MED ORDER — PIPERACILLIN-TAZOBACTAM 3.375 G IVPB 30 MIN
3.3750 g | Freq: Once | INTRAVENOUS | Status: AC
  Administered 2015-12-07: 3.375 g via INTRAVENOUS
  Filled 2015-12-07 (×2): qty 50

## 2015-12-07 MED ORDER — VANCOMYCIN HCL 10 G IV SOLR
1500.0000 mg | Freq: Once | INTRAVENOUS | Status: AC
  Administered 2015-12-07: 1500 mg via INTRAVENOUS
  Filled 2015-12-07: qty 1500

## 2015-12-07 MED ORDER — VITAMIN D 1000 UNITS PO TABS
4000.0000 [IU] | ORAL_TABLET | Freq: Every day | ORAL | Status: DC
  Administered 2015-12-07 – 2015-12-10 (×4): 4000 [IU] via ORAL
  Filled 2015-12-07 (×4): qty 4

## 2015-12-07 MED ORDER — POTASSIUM CHLORIDE CRYS ER 20 MEQ PO TBCR
20.0000 meq | EXTENDED_RELEASE_TABLET | Freq: Every day | ORAL | Status: DC
  Administered 2015-12-07 – 2015-12-09 (×3): 20 meq via ORAL
  Filled 2015-12-07 (×3): qty 1

## 2015-12-07 MED ORDER — ATORVASTATIN CALCIUM 80 MG PO TABS
80.0000 mg | ORAL_TABLET | Freq: Every day | ORAL | Status: DC
Start: 2015-12-07 — End: 2015-12-10
  Administered 2015-12-07 – 2015-12-09 (×3): 80 mg via ORAL
  Filled 2015-12-07 (×3): qty 1

## 2015-12-07 MED ORDER — HYDROCODONE-ACETAMINOPHEN 5-325 MG PO TABS
1.0000 | ORAL_TABLET | ORAL | Status: DC | PRN
  Filled 2015-12-07: qty 2

## 2015-12-07 NOTE — ED Triage Notes (Addendum)
Pt presents via EMS. He called because he could not get out of bed. Pt found to be covered in stool, diaphoretic and weak. Pt lives alone with a nurse who comes daily.   EMS vitals 100/60 HR 114 [id number]

## 2015-12-07 NOTE — ED Provider Notes (Signed)
[facility] Provider Note   CSN: [id number] Arrival date & time: 12/07/15  8295     History   Chief Complaint Chief Complaint  Patient presents with  . Weakness    HPI Martin Powell is a 70 y.o. male.  Patient is a 70 year old male with past medical history of multiple sclerosis, hypertension, or extremity cellulitis. He presents with complaints of weakness. He reports waking from sleep this morning feeling as though he had no energy and was having trouble getting out of bed. He then called the rescue squad and was transported here for evaluation. He denies any specific complaints such as chest pain, cough, abdominal pain, vomiting, or diarrhea.   The history is provided by the patient.  Weakness  This is a new problem. The current episode started less than 1 hour ago. The problem has not changed since onset.The maximum temperature recorded prior to his arrival was 100 to 100.9 F. Pertinent negatives include no shortness of breath and no confusion.    Past Medical History:  Diagnosis Date  . GERD (gastroesophageal reflux disease)   . Hypercholesteremia   . Hypertension   . Multiple sclerosis (HCC)   . Murmur, heart   . Thyroid activity decreased     Patient Active Problem List   Diagnosis Date Noted  . Pulmonary hypertension (HCC)   . Scrotal swelling   . Cellulitis LOWER EXTREMITIES. 04/13/2015  . Sepsis (HCC) 04/13/2015  . Fracture of right inferior pubic ramus (HCC) 10/06/2014  . Physical deconditioning 10/05/2014  . Pressure ulcer 10/03/2014  . Enteritis due to Clostridium difficile 10/03/2014  . Pelvic fracture (HCC) 10/03/2014  . Multiple sclerosis (HCC) 10/01/2014  . Hypercholesteremia 10/01/2014  . Hypotension 09/30/2014  . Dehydration 09/30/2014  . Hypothyroidism 09/30/2014  . Hyperlipidemia 09/30/2014    Past Surgical History:  Procedure Laterality Date  . APPENDECTOMY         Home Medications    Prior to Admission medications     Medication Sig Start Date End Date Taking? Authorizing Provider  acetaminophen (TYLENOL) 325 MG tablet Take 325 mg by mouth every 6 (six) hours as needed for mild pain or moderate pain.    Historical Provider, MD  atorvastatin (LIPITOR) 80 MG tablet Take 80 mg by mouth daily.    Historical Provider, MD  cephALEXin (KEFLEX) 500 MG capsule Take 1 capsule (500 mg total) by mouth 4 (four) times daily. 05/16/15   Geoffery Lyons, MD  Cholecalciferol (VITAMIN D3) 2000 UNITS TABS Take 1 tablet by mouth daily.    Historical Provider, MD  diltiazem (TIAZAC) 120 MG 24 hr capsule Take 120 mg by mouth daily.    Historical Provider, MD  Dimethyl Fumarate (TECFIDERA) 240 MG CPDR Take 240 mg by mouth 2 (two) times daily.     Historical Provider, MD  doxycycline (DORYX) 100 MG EC tablet Take 1 tablet (100 mg total) by mouth 2 (two) times daily. 4 more days from 04/18/15 04/18/15   Maretta Bees, MD  furosemide (LASIX) 20 MG tablet Take 1 tablet (20 mg total) by mouth daily. 04/18/15   Shanker Levora Dredge, MD  gabapentin (NEURONTIN) 300 MG capsule Take 1 capsule (300 mg total) by mouth 3 (three) times daily. 04/18/15   Shanker Levora Dredge, MD  HYDROcodone-acetaminophen (NORCO/VICODIN) 5-325 MG tablet Take 1 tablet by mouth every 6 (six) hours as needed. 04/18/15   Shanker Levora Dredge, MD  levothyroxine (SYNTHROID, LEVOTHROID) 112 MCG tablet Take 112 mcg by mouth daily before breakfast.  Historical Provider, MD  pantoprazole (PROTONIX) 40 MG tablet Take 40 mg by mouth at bedtime. To prevent acid reflux or bleeding (while on Xarelto).    Historical Provider, MD  potassium chloride SA (K-DUR,KLOR-CON) 20 MEQ tablet Take 1 tablet (20 mEq total) by mouth daily. 04/18/15   Shanker Levora DredgeM Ghimire, MD  Rivaroxaban (XARELTO) 20 MG TABS tablet Take 1 tablet (20 mg total) by mouth daily with supper. 04/18/15   Shanker Levora DredgeM Ghimire, MD  sotalol (BETAPACE) 80 MG tablet Take 40 mg by mouth 2 (two) times daily.     Historical Provider, MD     Family History Family History  Problem Relation Age of Onset  . CAD Neg Hx     Social History Social History  Substance Use Topics  . Smoking status: Never Smoker  . Smokeless tobacco: Never Used  . Alcohol use No     Allergies   Review of patient's allergies indicates no known allergies.   Review of Systems Review of Systems  Respiratory: Negative for shortness of breath.   Neurological: Positive for weakness.  Psychiatric/Behavioral: Negative for confusion.  All other systems reviewed and are negative.    Physical Exam Updated Vital Signs BP 123/75 (BP Location: Right Arm)   Pulse 114   Temp 100.3 F (37.9 C) (Oral)   Resp 24   SpO2 91%   Physical Exam  Constitutional: He is oriented to person, place, and time. He appears well-developed and well-nourished. No distress.  Patient appears somewhat pale and chronically ill.  HENT:  Head: Normocephalic and atraumatic.  Mouth/Throat: Oropharynx is clear and moist.  Neck: Normal range of motion. Neck supple.  Cardiovascular: Normal rate and regular rhythm.  Exam reveals no friction rub.   No murmur heard. Pulmonary/Chest: Effort normal and breath sounds normal. No respiratory distress. He has no wheezes. He has no rales.  Abdominal: Soft. Bowel sounds are normal. He exhibits no distension. There is no tenderness.  Musculoskeletal: Normal range of motion. He exhibits edema.  The left lower extremity is noted to have 2+ pitting edema. The right lower extremity has 1+ pitting edema.  Neurological: He is alert and oriented to person, place, and time. Coordination normal.  Skin: Skin is warm and dry. He is not diaphoretic.  Nursing note and vitals reviewed.    ED Treatments / Results  Labs (all labs ordered are listed, but only abnormal results are displayed) Labs Reviewed  CULTURE, BLOOD (ROUTINE X 2)  CULTURE, BLOOD (ROUTINE X 2)  URINE CULTURE  CULTURE, BLOOD (ROUTINE X 2)  CULTURE, BLOOD (ROUTINE X 2)   COMPREHENSIVE METABOLIC PANEL  CBC WITH DIFFERENTIAL/PLATELET  URINALYSIS, ROUTINE W REFLEX MICROSCOPIC (NOT AT [facility])  I-STAT CG4 LACTIC ACID, ED  I-STAT CG4 LACTIC ACID, ED    EKG  EKG Interpretation  Date/Time:  Wednesday December 07 2015 07:31:56 EDT Ventricular Rate:  116 PR Interval:    QRS Duration: 112 QT Interval:  394 QTC Calculation: 536 R Axis:   -5 Text Interpretation:  Atrial flutter Paired ventricular premature complexes Borderline intraventricular conduction delay Abnormal R-wave progression, early transition Repol abnrm, severe global ischemia (LM/MVD) Prolonged QT interval Confirmed by Judd LienELO  MD, Deniah Saia ([id number]) on 12/07/2015 7:54:27 AM Also confirmed by Judd LienELO  MD, Janely Gullickson ([id number]), editor Whitney PostLOGAN, Cala BradfordKIMBERLY [phone]([id number])  on 12/07/2015 9:10:59 AM       Radiology No results found.  Procedures Procedures (including critical care time)  Medications Ordered in ED Medications  sodium chloride 0.9 % bolus 1,000 mL (  not administered)     Initial Impression / Assessment and Plan / ED Course  I have reviewed the triage vital signs and the nursing notes.  Pertinent labs & imaging results that were available during my care of the patient were reviewed by me and considered in my medical decision making (see chart for details).  Clinical Course    Patient presents here with complaints of weakness. He is febrile with a white count of 15,000, however no source has been identified. His urine is clear and chest x-ray does not reveal an infiltrate. This patient has a history of sepsis related to cellulitis in the past. I feel as though admission for observation is in his best interest. He has been given Vanco and Zosyn presumptively. I've spoken with Dr. Konrad Dolores who agrees to admit.  Final Clinical Impressions(s) / ED Diagnoses   Final diagnoses:  None    New Prescriptions New Prescriptions   No medications on file     Geoffery Lyons, MD 12/07/15 1042

## 2015-12-07 NOTE — Progress Notes (Signed)
Patient coming from [facility] to a medical surgical observation bed at [facility]. Patient with generalized weakness, low O2 and elevated troponin. Elevated white count with left shift. No definitive source of infection. Patient started on vancomycin, Zosyn and IV fluids.    Shelly Flatten, MD [facility] 12/07/2015, 10:07 AM

## 2015-12-07 NOTE — H&P (Signed)
History and Physical    Martin Powell WRU:[id number] DOB: 1945/07/24 DOA: 12/07/2015  PCP: [facility]   Patient coming from:   [facility]  Chief Complaint: Generalize weakness   HPI: Martin Powell is a 70 y.o. male with medical history significant of MS HTN, and Thyroid diseases. Sent from [facility] with high WBC and fever. Patient report waking up this morning at 3 AM feeling diaphoretic and weak on his lower extremities, having troubles getting out the bed. Patient denies specific complaints, like chest pain, SOB, cough, abd pain, nausea, vomiting, diarrhea or dysuria.  Per nurse initial evaluation patient was covered on stools.   ED Course:  Upon ED arrival patient was found to be febrile max temp 100.9, with low O2 sats at 91% RA , Patient was placed in 2L [place] with good response, Sating at 99%. Patient was cultured, Sepsis protocol was started and antibiotics were given. Hospitalist services called for further evaluation.  Review of Systems: Denies chest pain, SOB, cough, abd pain, nausea, vomiting, diarrhea or dysuria.   Past Medical History:  Diagnosis Date  . Cellulitis   . CHF (congestive heart failure) (HCC)   . GERD (gastroesophageal reflux disease)   . Hypercholesteremia   . Hypertension   . Multiple sclerosis (HCC)   . Murmur, heart   . Pneumonia 2016  . Stroke [facility]) 2016 X 2   denies residual on 12/07/2015  . Thyroid activity decreased     Past Surgical History:  Procedure Laterality Date  . APPENDECTOMY    . ARTERIAL THROMBECTOMY  2016   "treated at [facility]; retrieved the clot in his brain"     reports that he has never smoked. He has never used smokeless tobacco. He reports that he does not drink alcohol or use drugs.  No Known Allergies  Family History  Problem Relation Age of Onset  . CAD Neg Hx     Prior to Admission medications   Medication Sig Start Date End Date Taking? Authorizing Provider  acetaminophen (TYLENOL) 325 MG  tablet Take 325 mg by mouth every 6 (six) hours as needed for mild pain or moderate pain.   Yes Historical Provider, MD  aspirin EC 81 MG tablet Take 81 mg by mouth daily.   Yes Historical Provider, MD  atorvastatin (LIPITOR) 80 MG tablet Take 80 mg by mouth daily.   Yes Historical Provider, MD  Cholecalciferol (VITAMIN D3) 2000 UNITS TABS Take 4,000 Units by mouth daily.    Yes Historical Provider, MD  diltiazem (TIAZAC) 120 MG 24 hr capsule Take 120 mg by mouth daily.   Yes Historical Provider, MD  Dimethyl Fumarate (TECFIDERA) 240 MG CPDR Take 240 mg by mouth 2 (two) times daily.    Yes Historical Provider, MD  furosemide (LASIX) 20 MG tablet Take 1 tablet (20 mg total) by mouth daily. Patient taking differently: Take 40 mg by mouth 2 (two) times daily.  04/18/15  Yes Shanker Levora Dredge, MD  gabapentin (NEURONTIN) 300 MG capsule Take 1 capsule (300 mg total) by mouth 3 (three) times daily. Patient taking differently: Take 300 mg by mouth 2 (two) times daily.  04/18/15  Yes Shanker Levora Dredge, MD  levothyroxine (SYNTHROID, LEVOTHROID) 100 MCG tablet Take 100 mcg by mouth daily before breakfast.   Yes Historical Provider, MD  lisinopril (PRINIVIL,ZESTRIL) 20 MG tablet Take 20 mg by mouth daily.   Yes Historical Provider, MD  pantoprazole (PROTONIX) 40 MG tablet Take 40 mg by  mouth daily as needed (for reflux). To prevent acid reflux or bleeding (while on Xarelto).    Yes Historical Provider, MD  potassium chloride SA (K-DUR,KLOR-CON) 20 MEQ tablet Take 1 tablet (20 mEq total) by mouth daily. Patient taking differently: Take 20 mEq by mouth 2 (two) times daily.  04/18/15  Yes Shanker Levora Dredge, MD  Rivaroxaban (XARELTO) 15 MG TABS tablet Take 15 mg by mouth daily with supper.   Yes Historical Provider, MD  sotalol (BETAPACE) 80 MG tablet Take 40 mg by mouth 2 (two) times daily.    Yes Historical Provider, MD    Physical Exam: Vitals:   12/07/15 1000 12/07/15 1030 12/07/15 1100 12/07/15 1229  BP:  101/73 102/71 118/74 122/71  Pulse: 110 109 117 (!) 119  Resp: 21 22 23 15   Temp:    98.8 F (37.1 C)  TempSrc:    Oral  SpO2: 98% 100% 98% 99%  Weight:    111.2 kg (245 lb 1.6 oz)  Height:    6\' 2"  (1.88 m)      Constitutional: NAD, calm, comfortable Vitals:   12/07/15 1000 12/07/15 1030 12/07/15 1100 12/07/15 1229  BP: 101/73 102/71 118/74 122/71  Pulse: 110 109 117 (!) 119  Resp: 21 22 23 15   Temp:    98.8 F (37.1 C)  TempSrc:    Oral  SpO2: 98% 100% 98% 99%  Weight:    111.2 kg (245 lb 1.6 oz)  Height:    6\' 2"  (1.88 m)   Eyes: PERRL, lids and conjunctivae normal ENMT: Mucous membranes are moist. Posterior pharynx clear of any exudate or lesions.Normal dentition.  Neck: normal, supple, no masses, no thyromegaly Respiratory: clear to auscultation bilaterally, no wheezing, no crackles. Normal respiratory effort. No accessory muscle use.  Cardiovascular:  +S1S2, Tachy @ 118, systolic murmur best at ls, no gallops  Abdomen: no tenderness, no masses palpated. No hepatosplenomegaly. Bowel sounds positive.  Musculoskeletal: B/L LE pitting edema 1+  no clubbing / cyanosis. Good ROM, no contractures. Normal muscle tone.  Skin: B/L LE diffuse erythema, warm,non tender, Sacral pressure ulcer, stage 1. No induration Neurologic:  Sensation intact, DTR normal. Strength 5/5 in all 4.  Psychiatric: Normal judgment and insight. Alert and oriented x 3. Normal mood.    Labs on Admission: I have personally reviewed following labs and imaging studies  CBC:  Recent Labs Lab 12/07/15 0745 12/07/15 1529  WBC 15.1* 12.4*  NEUTROABS 13.4* 10.8*  HGB 13.6 12.1*  HCT 40.3 38.9*  MCV 86.1 89.2  PLT 173 163   Basic Metabolic Panel:  Recent Labs Lab 12/07/15 0745 12/07/15 1529  NA 140 145  K 3.5 4.2  CL 105 109  CO2 25 29  GLUCOSE 155* 141*  BUN 25* 18  CREATININE 1.13 1.11  CALCIUM 9.0 9.1   GFR: Estimated Creatinine Clearance: 82.2 mL/min (by C-G formula based on SCr of  1.11 mg/dL). Liver Function Tests:  Recent Labs Lab 12/07/15 0745 12/07/15 1529  AST 25 24  ALT 18 16*  ALKPHOS 80 71  BILITOT 1.4* 1.5*  PROT 7.5 6.8  ALBUMIN 4.4 3.7   No results for input(s): LIPASE, AMYLASE in the last 168 hours. No results for input(s): AMMONIA in the last 168 hours. Coagulation Profile:  Recent Labs Lab 12/07/15 1529  INR 1.90   Cardiac Enzymes:  Recent Labs Lab 12/07/15 0745  TROPONINI 0.06*   BNP (last 3 results) No results for input(s): PROBNP in the last 8760 hours. HbA1C:  No results for input(s): HGBA1C in the last 72 hours. CBG: No results for input(s): GLUCAP in the last 168 hours. Lipid Profile: No results for input(s): CHOL, HDL, LDLCALC, TRIG, CHOLHDL, LDLDIRECT in the last 72 hours. Thyroid Function Tests: No results for input(s): TSH, T4TOTAL, FREET4, T3FREE, THYROIDAB in the last 72 hours. Anemia Panel: No results for input(s): VITAMINB12, FOLATE, FERRITIN, TIBC, IRON, RETICCTPCT in the last 72 hours. Urine analysis:    Component Value Date/Time   COLORURINE YELLOW 12/07/2015 0903   APPEARANCEUR CLEAR 12/07/2015 0903   LABSPEC 1.021 12/07/2015 0903   PHURINE 5.5 12/07/2015 0903   GLUCOSEU NEGATIVE 12/07/2015 0903   HGBUR SMALL (A) 12/07/2015 0903   BILIRUBINUR NEGATIVE 12/07/2015 0903   KETONESUR 15 (A) 12/07/2015 0903   PROTEINUR NEGATIVE 12/07/2015 0903   UROBILINOGEN 0.2 11/08/2014 2210   NITRITE NEGATIVE 12/07/2015 0903   LEUKOCYTESUR NEGATIVE 12/07/2015 0903   Sepsis Labs: !!!!!!!!!!!!!!!!!!!!!!!!!!!!!!!!!!!!!!!!!!!! @LABRCNTIP (procalcitonin:4,lacticidven:4) )No results found for this or any previous visit (from the past 240 hour(s)).   Radiological Exams on Admission: Dg Chest 2 View  Result Date: 12/07/2015 CLINICAL DATA:  Acute onset weakness. Unable to get out of bed or stand. EXAM: CHEST  2 VIEW COMPARISON:  04/13/2015 FINDINGS: Mild cardiomegaly stable. Aortic atherosclerosis. Ectasia of thoracic aorta also  demonstrated. Both lungs are clear. IMPRESSION: Mild cardiomegaly.  No active lung disease. Aortic atherosclerosis. Electronically Signed   By: Myles RosenthalJohn  Stahl M.D.   On: 12/07/2015 08:14    EKG: Independently reviewed, Atrial flutter Paired ventricular premature complexes Borderline intraventricular conduction delay Abnormal R-wave progression, early transition Repol abnrm, severe global ischemia (LM/MVD) Prolonged QT interval   Assessment/Plan Principal Problem:   Sepsis (HCC):Suspected sepsis by SIRS criteria, unknown source at this time, could be possible PNA given fever and initial low O2 sat. Initial CXR normal. Patient also hx of chronic cellulitis which could be the possible source of infection - will start Vanc and Zosyn, IVF, repeat lactic ac. Monitor O2 sat. Follow up cultures. Repeat CXR in Am.   Active Problems:   Cellulitis LOWER EXTREMITIES: Chronic condition, last treated on January 2017, covered with current antibiotics.    Multiple sclerosis (HCC): MS could have explained LE weakness, which was resolved prior top getting admitted. This problems is stable at this point. Patient on Tecfidera 240mg , which will continue.    Pressure ulcer: Sacral stage 1 pressure ulcer,    Fever   CHF (congestive heart failure) (HCC):  No signs of fluid overload, Continue Bb, ACE and Lasix, Will monitor for overload given patient in IVF.   Murmur, heart: Systolic murmur, Stable continue beta blockers   Thyroid activity decreased, Stable on Synthroid. Continue home meds. Will order TSH.    DVT prophylaxis: Patient already on Xarelto  Code Status:  DNI  Family Communication: if needed can call Roosvelt HarpsJackie Norman (sister)  Disposition Plan: Patient would be able to go home if breathing is stable and results from cultures for proper antibiotic treatment if needed.  Consults called: None  Admission status: Inpatient   Time spent 40 min.    Lenox PondsEdwin Silva Zapata MD [facility]  If 7PM-7AM,  please contact night-coverage [email or web address] Password [email or web address]  12/07/2015, 4:43 PM

## 2015-12-07 NOTE — Progress Notes (Signed)
Martin Powell [id number] Admission Data: 12/07/2015 2:26 PM Attending Provider: Lenox Ponds, MD  PCP:No PCP Per Patient Consults/ Treatment Team:   Martin Powell is a 70 y.o. male patient admitted from ED awake, alert  & orientated  X 3,  Prior, VSS - Blood pressure 122/71, pulse (!) 119, temperature 98.8 F (37.1 C), temperature source Oral, resp. rate 15, height 6\' 2"  (1.88 m), weight 111.2 kg (245 lb 1.6 oz), SpO2 99 %., no c/o shortness of breath, no c/o chest pain, no distress noted. Tele # 10 placed and pt is currently running:normal sinus rhythm.   IV site WDL:  forearm right, condition patent and no redness with a transparent dsg that's clean dry and intact.  Allergies:  No Known Allergies   Past Medical History:  Diagnosis Date  . GERD (gastroesophageal reflux disease)   . Hypercholesteremia   . Hypertension   . Multiple sclerosis (HCC)   . Murmur, heart   . Thyroid activity decreased     History:  obtained from the patient. Tobacco/alcohol: denied none  Pt orientation to unit, room and routine. Information packet given to patient/family and safety video watched.  Admission INP armband ID verified with patient/family, and in place. SR up x 2, fall risk assessment complete with Patient and family verbalizing understanding of risks associated with falls. Pt verbalizes an understanding of how to use the call bell and to call for help before getting out of bed.  Skin has moisture associated breakdown on his bottom and Warm/red BLE.     Will cont to monitor and assist as needed.  Martin Powell Consuella Lose, RN 12/07/2015 2:26 PM

## 2015-12-07 NOTE — Progress Notes (Signed)
Pharmacy Antibiotic Note  Martin LouisJohn Powell is a 70 y.o. male admitted on 12/07/2015 with sepsis. Patient presented to ED with weakness and fever of 100.3. WBC are elevated at 15.1. Patient was given Vancomycin 1g IV and Zosyn 3.375g IV in ED on 9/6. Pharmacy has been consulted for Vancomycin and Zosyn dosing.  Plan: Vancomycin 1500mg  IV to complete the loading dose (total of 2500mg ) followed by Vancomycin 750mg  IV every 12 hours.  Goal trough 15-20 mcg/mL.  Zosyn 3.375g IV every 8 hours (4 hour extended infusion). Monitor renal function, cultures/sensitivies, and length of treatment. Obtain vancomycin trough at steady state.  Height: 6\' 2"  (188 cm) Weight: 245 lb 1.6 oz (111.2 kg) IBW/kg (Calculated) : 82.2  Temp (24hrs), Avg:99.6 F (37.6 C), Min:98.8 F (37.1 C), Max:100.3 F (37.9 C)   Recent Labs Lab 12/07/15 0741 12/07/15 0745 12/07/15 1043  WBC  --  15.1*  --   CREATININE  --  1.13  --   LATICACIDVEN 1.40  --  0.95    Estimated Creatinine Clearance: 80.7 mL/min (by C-G formula based on SCr of 1.13 mg/dL).    No Known Allergies  Antimicrobials this admission: 9/6 Vanc > 9/6 Zosyn >  Microbiology results: 9/6 BCx: Pending 9/6 UCx: Pending  Thank you for allowing pharmacy to be a part of this patient's care.  Martin DeistKathryn Trae Powell 12/07/2015 2:45 PM

## 2015-12-08 ENCOUNTER — Observation Stay (HOSPITAL_COMMUNITY): Payer: Medicare Other

## 2015-12-08 DIAGNOSIS — I5032 Chronic diastolic (congestive) heart failure: Secondary | ICD-10-CM

## 2015-12-08 DIAGNOSIS — E876 Hypokalemia: Secondary | ICD-10-CM | POA: Diagnosis not present

## 2015-12-08 LAB — CBC
HEMATOCRIT: 37.3 % — AB (ref 39.0–52.0)
Hemoglobin: 11.8 g/dL — ABNORMAL LOW (ref 13.0–17.0)
MCH: 28 pg (ref 26.0–34.0)
MCHC: 31.6 g/dL (ref 30.0–36.0)
MCV: 88.6 fL (ref 78.0–100.0)
PLATELETS: 151 10*3/uL (ref 150–400)
RBC: 4.21 MIL/uL — AB (ref 4.22–5.81)
RDW: 14.8 % (ref 11.5–15.5)
WBC: 9.8 10*3/uL (ref 4.0–10.5)

## 2015-12-08 LAB — URINE CULTURE: CULTURE: NO GROWTH

## 2015-12-08 LAB — POTASSIUM: POTASSIUM: 3.8 mmol/L (ref 3.5–5.1)

## 2015-12-08 LAB — VANCOMYCIN, TROUGH: Vancomycin Tr: 14 ug/mL — ABNORMAL LOW (ref 15–20)

## 2015-12-08 LAB — BASIC METABOLIC PANEL
ANION GAP: 9 (ref 5–15)
BUN: 16 mg/dL (ref 6–20)
CO2: 24 mmol/L (ref 22–32)
Calcium: 8.6 mg/dL — ABNORMAL LOW (ref 8.9–10.3)
Chloride: 107 mmol/L (ref 101–111)
Creatinine, Ser: 1.26 mg/dL — ABNORMAL HIGH (ref 0.61–1.24)
GFR calc Af Amer: >60 mL/min (ref >60)
GFR, EST NON AFRICAN AMERICAN: 56 mL/min — AB (ref >60)
GLUCOSE: 120 mg/dL — AB (ref 65–99)
POTASSIUM: 3.3 mmol/L — AB (ref 3.5–5.1)
Sodium: 140 mmol/L (ref 135–145)

## 2015-12-08 LAB — TROPONIN I: Troponin I: 0.5 ng/mL (ref <0.03)

## 2015-12-08 MED ORDER — POTASSIUM CHLORIDE 10 MEQ/100ML IV SOLN
10.0000 meq | INTRAVENOUS | Status: AC
  Administered 2015-12-08 (×2): 10 meq via INTRAVENOUS
  Filled 2015-12-08 (×2): qty 100

## 2015-12-08 MED ORDER — RIVAROXABAN 20 MG PO TABS
20.0000 mg | ORAL_TABLET | Freq: Every day | ORAL | Status: DC
  Administered 2015-12-08 – 2015-12-10 (×3): 20 mg via ORAL
  Filled 2015-12-08 (×3): qty 1

## 2015-12-08 NOTE — Progress Notes (Signed)
CRITICAL VALUE ALERT  Critical value received:  Troponin 0.5  Date of notification:  12/08/15  Time of notification:  1604  Critical value read back:Yes.    Nurse who received alert:  Madelin RearLonnie Breandan People  MD notified (1st page):  Sharion DoveZapata  Time of first page:  1604  MD notified (2nd page):  Time of second page:  Responding MD:  Sharion DoveZapata  Time MD responded:  [phone]

## 2015-12-08 NOTE — Clinical Social Work Note (Signed)
Clinical Social Work Assessment  Patient Details  Name: Martin Powell MRN: [id number] Date of Birth: [birth year]  Date of referral:  12/08/15               Reason for consult:  Facility Placement                Permission sought to share information with:  Facility Sport and exercise psychologist, Family Supports Permission granted to share information::  Yes, Verbal Permission Granted  Name::     Environmental health practitioner::  SNFs  Relationship::  Sister  Contact Information:  [phone]  Housing/Transportation Living arrangements for the past 2 months:  Single Family Home Source of Information:  Patient, Other (Comment Required) (Sister) Patient Interpreter Needed:  None Criminal Activity/Legal Involvement Pertinent to Current Situation/Hospitalization:  No - Comment as needed Significant Relationships:  Siblings Lives with:  Self Do you feel safe going back to the place where you live?  No Need for family participation in patient care:  No (Coment)  Care giving concerns:  CSW received referral for possible SNF placement at time of discharge. CSW met with patient and patient's sister at bedside regarding PT recommendation of SNF placement at time of discharge. Per patient's sister, patient lives alone with just an aide coming in for a few hours a day and is currently unable to care for himself at home given patient's current physical needs and fall risk. Patient and patient's sister expressed understanding of PT recommendation and are agreeable to SNF placement at time of discharge. CSW to continue to follow and assist with discharge planning needs.   Social Worker assessment / plan:  CSW spoke with patient and patient's sister concerning possibility of rehab at [facility] before returning home.  Employment status:  Retired Forensic scientist:  Medicare PT Recommendations:  [place] / Referral to community resources:  [place]  Patient/Family's Response to care:   Patient and patient's spouse recognize need for rehab before returning home and are agreeable to a SNF in [place]. Patient does not want to go to [facility].  Patient/Family's Understanding of and Emotional Response to Diagnosis, Current Treatment, and Prognosis:  Patient/family is realistic regarding therapy needs and expressed being hopeful for SNF placement. Patient expressed understanding of CSW role and discharge process. No questions/concerns about plan or treatment.    Emotional Assessment Appearance:  Appears stated age Attitude/Demeanor/Rapport:  Other (Appropriate) Affect (typically observed):  Accepting, Appropriate, Pleasant Orientation:  Oriented to Self, Oriented to Situation, Oriented to Place, Oriented to  Time Alcohol / Substance use:  Not Applicable Psych involvement (Current and /or in the community):  No (Comment)  Discharge Needs  Concerns to be addressed:  Care Coordination Readmission within the last 30 days:  No Current discharge risk:  None Barriers to Discharge:  Continued Medical Work up   [employer], [place] 12/08/2015, 4:58 PM

## 2015-12-08 NOTE — NC FL2 (Signed)
[place] MEDICAID FL2 LEVEL OF CARE SCREENING TOOL     IDENTIFICATION  Patient Name: Martin Powell Birthdate: 1945/08/01 Sex: male Admission Date (Current Location): 12/07/2015  [facility] [place]   Facility and Address:  [facility] [place], [address], [place], [place] [address]      Provider Number: [id number]  Attending Physician Name and Address:  Lenox PondsEdwin Silva Zapata, MD  Relative Name and Phone Number:  Pati GalloJackie Norman, Sister [phone]    Current Level of Care: Hospital Recommended Level of Care: Skilled Nursing Facility Prior Approval Number:    Date Approved/Denied:   PASRR Number: [id number]  Discharge Plan: SNF    Current Diagnoses: Patient Active Problem List   Diagnosis Date Noted  . Hypokalemia 12/08/2015  . Fever 12/07/2015  . Elevated troponin 12/07/2015  . CHF (congestive heart failure) (HCC)   . Murmur, heart   . Thyroid activity decreased   . Pulmonary hypertension (HCC)   . Scrotal swelling   . Cellulitis LOWER EXTREMITIES. 04/13/2015  . Sepsis (HCC) 04/13/2015  . Fracture of right inferior pubic ramus (HCC) 10/06/2014  . Physical deconditioning 10/05/2014  . Pressure ulcer 10/03/2014  . Enteritis due to Clostridium difficile 10/03/2014  . Pelvic fracture (HCC) 10/03/2014  . Multiple sclerosis (HCC) 10/01/2014  . Hypercholesteremia 10/01/2014  . Hypotension 09/30/2014  . Dehydration 09/30/2014  . Hypothyroidism 09/30/2014  . Hyperlipidemia 09/30/2014    Orientation RESPIRATION BLADDER Height & Weight     Self, Time, Situation, Place  Normal Continent Weight: 111.2 kg (245 lb 1.6 oz) Height:  6\' 2"  (188 cm)  BEHAVIORAL SYMPTOMS/MOOD NEUROLOGICAL BOWEL NUTRITION STATUS      Continent Diet (Please see DC Summary)  AMBULATORY STATUS COMMUNICATION OF NEEDS Skin   Extensive Assist Verbally Other (Comment) (Wound on buttocks)                       Personal Care Assistance Level of Assistance   Bathing, Feeding, Dressing Bathing Assistance: Maximum assistance Feeding assistance: Independent Dressing Assistance: Limited assistance     Functional Limitations Info             SPECIAL CARE FACTORS FREQUENCY  PT (By licensed PT)     PT Frequency: 5x/week              Contractures      Additional Factors Info  Code Status, Allergies Code Status Info: Partial code Allergies Info: NKA           Current Medications (12/08/2015):  This is the current hospital active medication list Current Facility-Administered Medications  Medication Dose Route Frequency Provider Last Rate Last Dose  . 0.45 % sodium chloride infusion   Intravenous Continuous Lenox PondsEdwin Silva Zapata, MD 75 mL/hr at 12/07/15 1635    . acetaminophen (TYLENOL) tablet 325 mg  325 mg Oral Q6H PRN Lenox PondsEdwin Silva Zapata, MD      . aspirin EC tablet 81 mg  81 mg Oral Daily Lenox PondsEdwin Silva Zapata, MD   81 mg at 12/08/15 [phone]  . atorvastatin (LIPITOR) tablet 80 mg  80 mg Oral q1800 Lenox PondsEdwin Silva Zapata, MD   80 mg at 12/07/15 1635  . bisacodyl (DULCOLAX) EC tablet 5 mg  5 mg Oral Daily PRN Lenox PondsEdwin Silva Zapata, MD      . cholecalciferol (VITAMIN D) tablet 4,000 Units  4,000 Units Oral Daily Lenox PondsEdwin Silva Zapata, MD   4,000 Units at 12/08/15 [phone]  . diltiazem (CARDIZEM CD)  24 hr capsule 120 mg  120 mg Oral Daily Lenox Ponds, MD   120 mg at 12/08/15 1478  . Dimethyl Fumarate CPDR 240 mg  240 mg Oral BID Lenox Ponds, MD   240 mg at 12/08/15 1624  . furosemide (LASIX) tablet 40 mg  40 mg Oral BID Lenox Ponds, MD   40 mg at 12/08/15 2956  . gabapentin (NEURONTIN) capsule 300 mg  300 mg Oral TID Lenox Ponds, MD   300 mg at 12/08/15 1518  . HYDROcodone-acetaminophen (NORCO/VICODIN) 5-325 MG per tablet 1-2 tablet  1-2 tablet Oral Q4H PRN Lenox Ponds, MD      . levothyroxine (SYNTHROID, LEVOTHROID) tablet 100 mcg  100 mcg Oral QAC breakfast Lenox Ponds, MD   100 mcg at 12/08/15 [phone]  . lisinopril  (PRINIVIL,ZESTRIL) tablet 20 mg  20 mg Oral Daily Lenox Ponds, MD   20 mg at 12/08/15 8657  . ondansetron (ZOFRAN) tablet 4 mg  4 mg Oral Q6H PRN Lenox Ponds, MD       Or  . ondansetron [facility]) injection 4 mg  4 mg Intravenous Q6H PRN Lenox Ponds, MD      . pantoprazole (PROTONIX) EC tablet 40 mg  40 mg Oral Daily PRN Lenox Ponds, MD      . piperacillin-tazobactam (ZOSYN) IVPB 3.375 g  3.375 g Intravenous Q8H Lenox Ponds, MD   3.375 g at 12/08/15 1515  . potassium chloride SA (K-DUR,KLOR-CON) CR tablet 20 mEq  20 mEq Oral Daily Lenox Ponds, MD   20 mEq at 12/08/15 0937  . rivaroxaban (XARELTO) tablet 20 mg  20 mg Oral Q supper Para March Batchelder, RPH      . sotalol (BETAPACE) tablet 40 mg  40 mg Oral BID Lenox Ponds, MD   40 mg at 12/08/15 8469  . vancomycin (VANCOCIN) IVPB 750 mg/150 ml premix  750 mg Intravenous Q12H Lenox Ponds, MD   750 mg at 12/08/15 1515     Discharge Medications: Please see discharge summary for a list of discharge medications.  Relevant Imaging Results:  Relevant Lab Results:   Additional Information SSN: [id number] [place], [place]

## 2015-12-08 NOTE — Evaluation (Signed)
Physical Therapy Evaluation Patient Details Name: Martin LouisJohn Powell MRN: [id number] DOB: 04/28/1945 Today's Date: 12/08/2015   History of Present Illness  Patient is a 70 y/o male with hx of MS, HTN, CVA, cellulitis and CHF [facility] with high WBC and fever. In ED, febrile max temp 100.9, with low O2 sats at 91%. Sepsis protocol started. New CXR show possible RLL PNA.  Clinical Impression  Patient presents with generalized weakness, balance deficits, decreased safety awareness and impaired overall mobility s/p above. PTA, pt requires assist from aide and family for ADLs and IADls but Mod I for ambulation. Today, pt requires Max A to stand but only able to obtain half standing and has complete LOB onto bed and to the right due to bil knees giving out. Pt is high fall risk and not safe to return home alone. Would benefit from short term SNF to maximize independence and mobility prior to return home.    Follow Up Recommendations SNF    Equipment Recommendations  None recommended by PT    Recommendations for Other Services OT consult     Precautions / Restrictions Precautions Precautions: Fall Restrictions Weight Bearing Restrictions: No      Mobility  Bed Mobility Overal bed mobility: Needs Assistance Bed Mobility: Supine to Sit;Rolling;Sit to Supine Rolling: Mod assist   Supine to sit: HOB elevated;Max assist Sit to supine: Mod assist   General bed mobility comments: Requires assist to elevate trunk and scoot bottom to EOB. Increased time. Difficulty noted- "you need to give me a hand." Rolling to right/left to place bed pan. Requires assist to bring BLEs into bed.  Transfers Overall transfer level: Needs assistance Equipment used: Rolling walker (2 wheeled) Transfers: Sit to/from Stand Sit to Stand: Max assist;From elevated surface         General transfer comment: Assist to power up but pt not able to obtain upright due to increased knee flexion and  weakness. Complete LOB to the right onto the bed requiring total A to get upright; Urgently trying to get [facility] but not safe doing so so returned to supine for bed pan.  Ambulation/Gait Ambulation/Gait assistance:  (Deferred secondary to weakness/safety.)              Stairs            Wheelchair Mobility    Modified Rankin (Stroke Patients Only)       Balance Overall balance assessment: Needs assistance Sitting-balance support: Feet supported;No upper extremity supported Sitting balance-Leahy Scale: Fair Sitting balance - Comments: Able to sit EOB initially with min guard assist, however after attempting to stand pt with complete LOB onto bed and to the right requiring max A to obtain upright.    Standing balance support: During functional activity Standing balance-Leahy Scale: Zero Standing balance comment: Pt able to get half standing with BUE support and Max A with bil knee instability. Only able to partially stand for a few seconds.                             Pertinent Vitals/Pain Pain Assessment: No/denies pain    Home Living Family/patient expects to be discharged to:: Private residence Living Arrangements: Alone Available Help at Discharge: Available PRN/intermittently;Family Type of Home: Apartment Home Access: Level entry     Home Layout: One level Home Equipment: Walker - 2 wheels;Bedside commode;Shower seat;Cane - single point;Wheelchair - manual;Hospital bed  Prior Function Level of Independence: Independent with assistive device(s)         Comments: using rw for mobility; has HH aide come in for a few hours daily 5 days/week to assist with cooking, cleaning and bathing. Sister does grocery shopping.     Hand Dominance        Extremity/Trunk Assessment               Lower Extremity Assessment: Generalized weakness RLE Deficits / Details: Grossly ~2/5 knee extension; not able to stand upright due to weakness. LLE  Deficits / Details: Grossly ~2/5 knee extension; not able to stand upright due to weakness     Communication   Communication: No difficulties  Cognition Arousal/Alertness: Awake/alert Behavior During Therapy: WFL for tasks assessed/performed Overall Cognitive Status: Impaired/Different from baseline Area of Impairment: Safety/judgement         Safety/Judgement: Decreased awareness of deficits;Decreased awareness of safety     General Comments: Reports feeling back to baseline but attempting to get up and falling over. "I just need my legs to get steady," as pt falling towards the right and onto the bed.    General Comments      Exercises        Assessment/Plan    PT Assessment Patient needs continued PT services  PT Diagnosis Generalized weakness;Difficulty walking   PT Problem List Decreased strength;Decreased mobility;Decreased balance;Decreased range of motion;Decreased activity tolerance;Decreased cognition  PT Treatment Interventions Therapeutic activities;Gait training;Therapeutic exercise;Patient/family education;Balance training;Functional mobility training;Neuromuscular re-education   PT Goals (Current goals can be found in the Care Plan section) Acute Rehab PT Goals Patient Stated Goal: to get stronger PT Goal Formulation: With patient Time For Goal Achievement: 12/22/15 Potential to Achieve Goals: Fair    Frequency Min 2X/week   Barriers to discharge Decreased caregiver support lives alone    Co-evaluation               End of Session Equipment Utilized During Treatment: Gait belt Activity Tolerance: Patient limited by fatigue Patient left: in bed;with call bell/phone within reach;with nursing/sitter in room (on bed pad with RN in room) Nurse Communication: Mobility status;Other (comment);Need for lift equipment         Time: 1150-1211 PT Time Calculation (min) (ACUTE ONLY): 21 min   Charges:   PT Evaluation $PT Eval Moderate Complexity:  1 Procedure     PT G Codes:        Martin Powell A Martin Powell 12/08/2015, 2:02 PM Mylo Red, PT, DPT [phone]

## 2015-12-08 NOTE — Progress Notes (Signed)
PROGRESS NOTE    Martin Powell  [id number] DOB: [birth year] DOA: 12/07/2015 [facility]   Brief Narrative:  Martin Powell is a 70 y.o. male with medical history significant of MS HTN, and Thyroid diseases, Diastolic CHF, admitted with sepsis likely secondary to PNA and Cellulitis of LE now antibiotics Zosyn and Vanc, WBC trending down, Leg cellulitis significantly improves. New CXR show possible RLL PNA. Patient stable, cultures pending.     Assessment & Plan:   Principal Problem:   Sepsis (HCC) Active Problems:   Cellulitis LOWER EXTREMITIES.   Elevated troponin   Multiple sclerosis (HCC)   Pressure ulcer   CHF (congestive heart failure) (HCC)   Murmur, heart   Thyroid activity decreased   Hypokalemia  Sepsis (HCC):Suspected sepsis by SIRS criteria, could be secondary to possible PNA given fever and initial low O2 sat. Repeat CXR shows possible RLL PNA.  Patient also hx of chronic cellulitis which could be the possible source of infection - will continue  Vanc and Zosyn,  - gentle IV hydration, monitor for signs of fluid overload. - Monitor O2 sat.  - Follow up cultures.    Elevated Troponin: Likely 2/2 sepsis in combination with CHF.  - Will trend troponins   Cellulitis LOWER EXTREMITIES: Chronic condition, last treated on January 2017 - covered with current antibiotics.   Multiple sclerosis (HCC): Stable, MS could have explained LE weakness, which was resolved prior top getting admitted. Patient on Tecfidera 240mg  daily  - Continue home meds - PT eval     Pressure ulcer: Sacral stage 1 pressure ulcer - Frequent turns and reposition q 2hrs     CHF (congestive heart failure) (HCC): LVEF 65-70%, Grade 2 diastolic dysfunction, No signs of fluid overload,  - Continue Bb, ACE and Lasix,  - Will monitor for overload given patient in IVF.    Murmur, heart: Systolic murmur, Stable continue beta blockers    Thyroid activity decreased, Stable on Synthroid.  Continue home meds. Will order TSH.   Hypokalemia: Likely due to Lasix use. Patient on Kdur at home  - Replace with Kcl 20mEq IV - Repeat BMP      DVT prophylaxis: Patient already on Xarelto  Code Status:  DNI  Family Communication: None  Disposition Plan: Patient will be discharge home, pending cultures.   Consultants:   None   Procedures:   None  Antimicrobials:   Zosyn (12/07/15) pending culture will switch to PO med  Vanco (12/07/15) Vanc trough today   Subjective:  Patient seen and examined at bedside. Patient reports feeling improvement of the legs, although has not attempted to walk. Denies chest pain, cough, SOB, dizziness and weakness. Patient having breakfast with no difficulties.   Objective: Vitals:   12/07/15 1645 12/07/15 1832 12/07/15 2114 12/08/15 0529  BP: 127/87  103/60 102/64  Pulse: (!) 122 (!) 129 (!) 113 (!) 139  Resp:   17 17  Temp: 98.5 F (36.9 C)  98.8 F (37.1 C) 99 F (37.2 C)  TempSrc: Oral  Oral Oral  SpO2:   95% 95%  Weight:      Height:        Intake/Output Summary (Last 24 hours) at 12/08/15 1154 Last data filed at 12/08/15 0219  Gross per 24 hour  Intake            722.5 ml  Output             1000 ml  Net           -  277.5 ml   Filed Weights   12/07/15 0801 12/07/15 1229  Weight: 113.4 kg (250 lb) 111.2 kg (245 lb 1.6 oz)    Examination:  General exam: Appears calm and comfortable  Respiratory system: Clear to auscultation. Respiratory effort normal. Cardiovascular system:+S1S2, Tachy @ 108, systolic murmur best at ls, no gallops, Pedal edema 1+ Gastrointestinal system: Abdomen is nondistended, soft and nontender. No organomegaly or masses felt. Normal bowel sounds heard. Central nervous system: Alert and oriented. No focal neurological deficits. Extremities: Symmetric 5 x 5 power. Skin:  B/L LE diffuse erythema, warm,non tender (SIG IMPROVED TODAY), Sacral pressure ulcer, stage 1.      Data Reviewed: I have  personally reviewed following labs and imaging studies  CBC:  Recent Labs Lab 12/07/15 0745 12/07/15 1529 12/08/15 0446  WBC 15.1* 12.4* 9.8  NEUTROABS 13.4* 10.8*  --   HGB 13.6 12.1* 11.8*  HCT 40.3 38.9* 37.3*  MCV 86.1 89.2 88.6  PLT 173 163 151   Basic Metabolic Panel:  Recent Labs Lab 12/07/15 0745 12/07/15 1529 12/08/15 0446  NA 140 145 140  K 3.5 4.2 3.3*  CL 105 109 107  CO2 25 29 24   GLUCOSE 155* 141* 120*  BUN 25* 18 16  CREATININE 1.13 1.11 1.26*  CALCIUM 9.0 9.1 8.6*   GFR: Estimated Creatinine Clearance: 72.4 mL/min (by C-G formula based on SCr of 1.26 mg/dL). Liver Function Tests:  Recent Labs Lab 12/07/15 0745 12/07/15 1529  AST 25 24  ALT 18 16*  ALKPHOS 80 71  BILITOT 1.4* 1.5*  PROT 7.5 6.8  ALBUMIN 4.4 3.7   No results for input(s): LIPASE, AMYLASE in the last 168 hours. No results for input(s): AMMONIA in the last 168 hours. Coagulation Profile:  Recent Labs Lab 12/07/15 1529  INR 1.90   Cardiac Enzymes:  Recent Labs Lab 12/07/15 0745  TROPONINI 0.06*   BNP (last 3 results) No results for input(s): PROBNP in the last 8760 hours. HbA1C: No results for input(s): HGBA1C in the last 72 hours. CBG: No results for input(s): GLUCAP in the last 168 hours. Lipid Profile: No results for input(s): CHOL, HDL, LDLCALC, TRIG, CHOLHDL, LDLDIRECT in the last 72 hours. Thyroid Function Tests:  Recent Labs  12/07/15 1529  TSH 0.735   Anemia Panel: No results for input(s): VITAMINB12, FOLATE, FERRITIN, TIBC, IRON, RETICCTPCT in the last 72 hours. Sepsis Labs:  Recent Labs Lab 12/07/15 0741 12/07/15 1043 12/07/15 1529 12/07/15 1744  PROCALCITON  --   --  0.16  --   LATICACIDVEN 1.40 0.95 1.7 1.2    Recent Results (from the past 240 hour(s))  Urine culture     Status: None   Collection Time: 12/07/15  9:03 AM  Result Value Ref Range Status   Specimen Description URINE, CLEAN CATCH  Final   Special Requests NONE  Final    Culture NO GROWTH Performed at [facility]   Final   Report Status 12/08/2015 FINAL  Final       Radiology Studies: X-ray Chest Pa And Lateral  Result Date: 12/08/2015 CLINICAL DATA:  Shortness of breath, sepsis, hypertension, CHF, multiple scleroses. EXAM: CHEST  2 VIEW COMPARISON:  PA and lateral chest x-ray of December 07, 2015 FINDINGS: Patient is positioned in a lordotic matter on the frontal view. The lung markings at the right base have increased. The cardiac silhouette is subjectively enlarged but this may be projectional. The pulmonary vascularity is not clearly engorged. There is no pleural effusion.  There is calcification in the wall of the aortic arch. There is multilevel degenerative disc disease of the thoracic spine. IMPRESSION: Slightly decreased inflation of both lungs. Probable right basilar atelectasis or early pneumonia. Enlargement of cardiac silhouette which may be related to the lordotic positioning. Aortic atherosclerosis. Electronically Signed   By: David  [place] M.D.   On: 12/08/2015 07:51   Dg Chest 2 View  Result Date: 12/07/2015 CLINICAL DATA:  Acute onset weakness. Unable to get out of bed or stand. EXAM: CHEST  2 VIEW COMPARISON:  04/13/2015 FINDINGS: Mild cardiomegaly stable. Aortic atherosclerosis. Ectasia of thoracic aorta also demonstrated. Both lungs are clear. IMPRESSION: Mild cardiomegaly.  No active lung disease. Aortic atherosclerosis. Electronically Signed   By: Myles Rosenthal M.D.   On: 12/07/2015 08:14        Scheduled Meds: . aspirin EC  81 mg Oral Daily  . atorvastatin  80 mg Oral q1800  . cholecalciferol  4,000 Units Oral Daily  . diltiazem  120 mg Oral Daily  . Dimethyl Fumarate  240 mg Oral BID  . furosemide  40 mg Oral BID  . gabapentin  300 mg Oral TID  . levothyroxine  100 mcg Oral QAC breakfast  . lisinopril  20 mg Oral Daily  . piperacillin-tazobactam (ZOSYN)  IV  3.375 g Intravenous Q8H  . potassium chloride SA  20 mEq Oral  Daily  . Rivaroxaban  15 mg Oral Q supper  . sotalol  40 mg Oral BID  . vancomycin  750 mg Intravenous Q12H   Continuous Infusions: . sodium chloride 75 mL/hr at 12/07/15 1635     LOS: 1 day    Time spent: 30 minutes    Lenox Ponds, MD [facility] Pager [phone]  If 7PM-7AM, please contact night-coverage [email or web address] Password [email or web address] 12/08/2015, 11:54 AM

## 2015-12-08 NOTE — Care Management Note (Addendum)
Case Management Note  Patient Details  Name: Martin Powell MRN: [id number] Date of Birth: [birth year]  Subjective/Objective:                 Spoke with patient at the bedside. He states he lives at home alone, has helping hands provide HHA 3 hoours a day 5 days a week, and a brother and a sister that assist him as well. He states that has a hospital bed, BSC, walker, WC, shower seat. PCP is at [facility]. He gets his medications at [facility].    Action/Plan:  Patient will DC to SNF when medically stable  Expected Discharge Date:                  Expected Discharge Plan:  Home/Self Care  In-House Referral:  Clinical Social Work  Discharge planning Services  CM Consult  Post Acute Care Choice:  NA Choice offered to:  NA  DME Arranged:    DME Agency:     HH Arranged:    HH Agency:     Status of Service:  In process, will continue to follow  If discussed at Long Length of Stay Meetings, dates discussed:    Additional Comments:  Lawerance Sabal, RN 12/08/2015, 1:34 PM

## 2015-12-09 DIAGNOSIS — R29898 Other symptoms and signs involving the musculoskeletal system: Secondary | ICD-10-CM | POA: Diagnosis present

## 2015-12-09 DIAGNOSIS — I480 Paroxysmal atrial fibrillation: Secondary | ICD-10-CM | POA: Diagnosis present

## 2015-12-09 LAB — CBC
HCT: 36.2 % — ABNORMAL LOW (ref 39.0–52.0)
HEMOGLOBIN: 11.6 g/dL — AB (ref 13.0–17.0)
MCH: 28 pg (ref 26.0–34.0)
MCHC: 32 g/dL (ref 30.0–36.0)
MCV: 87.2 fL (ref 78.0–100.0)
PLATELETS: 131 10*3/uL — AB (ref 150–400)
RBC: 4.15 MIL/uL — ABNORMAL LOW (ref 4.22–5.81)
RDW: 14.8 % (ref 11.5–15.5)
WBC: 7.5 10*3/uL (ref 4.0–10.5)

## 2015-12-09 LAB — BASIC METABOLIC PANEL
Anion gap: 9 (ref 5–15)
BUN: 12 mg/dL (ref 6–20)
CALCIUM: 8.3 mg/dL — AB (ref 8.9–10.3)
CO2: 25 mmol/L (ref 22–32)
CREATININE: 1.04 mg/dL (ref 0.61–1.24)
Chloride: 104 mmol/L (ref 101–111)
GFR calc Af Amer: >60 mL/min (ref >60)
GFR calc non Af Amer: >60 mL/min (ref >60)
GLUCOSE: 104 mg/dL — AB (ref 65–99)
Potassium: 3.3 mmol/L — ABNORMAL LOW (ref 3.5–5.1)
Sodium: 138 mmol/L (ref 135–145)

## 2015-12-09 MED ORDER — SOTALOL HCL 120 MG PO TABS
120.0000 mg | ORAL_TABLET | Freq: Two times a day (BID) | ORAL | Status: DC
  Administered 2015-12-09 – 2015-12-10 (×2): 120 mg via ORAL
  Filled 2015-12-09 (×3): qty 1

## 2015-12-09 MED ORDER — AMOXICILLIN-POT CLAVULANATE 875-125 MG PO TABS
1.0000 | ORAL_TABLET | Freq: Two times a day (BID) | ORAL | Status: DC
  Administered 2015-12-09 – 2015-12-10 (×2): 1 via ORAL
  Filled 2015-12-09 (×2): qty 1

## 2015-12-09 MED ORDER — POTASSIUM CHLORIDE CRYS ER 20 MEQ PO TBCR: 40.0000 meq | EXTENDED_RELEASE_TABLET | Freq: Every day | ORAL | Status: DC

## 2015-12-09 MED ORDER — POTASSIUM CHLORIDE CRYS ER 20 MEQ PO TBCR
40.0000 meq | EXTENDED_RELEASE_TABLET | Freq: Two times a day (BID) | ORAL | Status: DC
  Administered 2015-12-09 – 2015-12-10 (×3): 40 meq via ORAL
  Filled 2015-12-09 (×3): qty 2

## 2015-12-09 MED ORDER — POTASSIUM CHLORIDE 10 MEQ/100ML IV SOLN
10.0000 meq | INTRAVENOUS | Status: AC
  Administered 2015-12-09 (×2): 10 meq via INTRAVENOUS
  Filled 2015-12-09 (×2): qty 100

## 2015-12-09 NOTE — Progress Notes (Signed)
Pt has chosen [facility] SN[facility]- anticipate DC to SNF tomorrow per MD  CSW will continue to follow  Burna Sis, [facility] Clinical Social Worker [phone]

## 2015-12-09 NOTE — Progress Notes (Signed)
PROGRESS NOTE    Martin Powell  QVZ:[id number] DOB: 22-May-1945 DOA: 12/07/2015 PCP: [facility]   Brief Narrative:  Martin Powell is a 70 y.o. male with medical history significant of MS HTN, and Thyroid diseases, Diastolic CHF, admitted with sepsis likely secondary to PNA and Cellulitis of LE now antibiotics Zosyn and Vanc, WBC trending down, Leg cellulitis significantly improved. CXR show possible RLL PNA on 12/09/15. Patient developed A. fib yesterday which is chronic heart rate in the 150s. Patient on a/c, Cardizem and Sotolol. Patient stable at this time, cultures no growth on 24 hrs.  PT evaluation recommended short-term rehabilitation due to high fall risk.  Assessment & Plan:   Principal Problem:   Sepsis (HCC) Active Problems:   Cellulitis LOWER EXTREMITIES.   Diastolic heart failure (HCC)   Elevated troponin   Paroxysmal a-fib (HCC)   Weakness of both lower extremities   Multiple sclerosis (HCC)   Pressure ulcer   Thyroid activity decreased   Hypokalemia  Sepsis (HCC): Resolved, secondary to to acute on chronic cellulitis and PNA given fever and initial low O2 sat. Repeat CXR shows possible RLL PNA. Cultures no growth in > 24 hrs. WBC normal, no fever, erythema improving.  - will switch Zosyn to Augmentin, continue vanco for 1 more day will d/c prior to discharge.  - D/c IVF, patient eating and drinking well. - Monitor O2 sat.  - Follow up cultures.    Cellulitis LOWER EXTREMITIES: Improving Chronic condition, last treated on January 2017 - covered with current antibiotics   Elevated Troponin: Trending down, likely 2/2 sepsis in combination with CHF.  - No further monitoring   PAF: Chronic hx of paroxymal afib. Patient on Xarelto, Afib on tele and EKG this morning heart rate @ 127. Patient on  Cardizem and sotalol at home. - Continue Cardizem - We'll increase sotalol to 120 twice a day - Continue Xarelto  - Continue telemetry  CHF (congestive heart failure)  (HCC): Stable  LVEF 65-70%, Grade 2 diastolic dysfunction, No signs of fluid overload,  - Continue Bb, ACE and Lasix  Weakness lower extremities: Likely secondary to deconditioning in combination with MS - PT recommendations for short-term rehabilitation, we'll arrange with care management for skilled nurse facility - Discussed with the patient, patient in agreement  Multiple sclerosis (HCC): Stable, MS could have explained LE weakness, which was resolved prior top getting admitted. Patient on Tecfidera 240mg  daily  - Continue home meds - PT eval     Pressure ulcer: Stable, Sacral stage 1 pressure ulcer - Frequent turns and reposition q 2hrs    Thyroid activity decreased, Stable on Synthroid. Continue home meds. Will order TSH.   Hypokalemia: Likely due to Lasix. Patient on Kdur at home  - We will increase Kdur to 40 mEq - Replace with Kcl IV - Repeat BMP in p.m.   DVT prophylaxis: Patient on Xarelto  Code Status:  DNI  Family Communication: None  Disposition Plan: PT recommended short-term skilled nursing facility, due to high fall risk. Possible discharge tomorrow  Consultants:   None   Procedures:   None  Antimicrobials:   Zosyn (12/07/15) will switch to Augmentin tonight first dose.   Vanco (12/07/15) d/c tomorrow   Subjective:  Patient seen and examined at bedside. No acute events overnight. Telemetry monitor with A. Fib. Denies chest pain, cough, SOB, dizziness and weakness. Patient reported no concerns.   Objective: Vitals:   12/08/15 0529 12/08/15 1400 12/08/15 2142 12/09/15 0535  BP:  102/64 (!) 105/51 119/78 127/83  Pulse: (!) 139 (!) 134 (!) 107 (!) 120  Resp: 17 15 20 20   Temp: 99 F (37.2 C) 98 F (36.7 C) 100 F (37.8 C) 97.8 F (36.6 C)  TempSrc: Oral Oral Oral Oral  SpO2: 95% 94% 97% 97%  Weight:      Height:        Intake/Output Summary (Last 24 hours) at 12/09/15 1037 Last data filed at 12/09/15 1013  Gross per 24 hour  Intake            3567.5 ml  Output              700 ml  Net           2867.5 ml   Filed Weights   12/07/15 0801 12/07/15 1229  Weight: 113.4 kg (250 lb) 111.2 kg (245 lb 1.6 oz)    Examination:  General exam: Appears calm and comfortable  Respiratory system: Clear to auscultation. Respiratory effort normal. Cardiovascular system:+S1S2, Afib @ 120, systolic murmur best at ls, no gallops, Pedal edema 1+ Gastrointestinal system: Abdomen is nondistended, soft and nontender. No organomegaly or masses felt. Normal bowel sounds heard. Central nervous system: Alert and oriented. No focal neurological deficits. Extremities: Symmetric 5 x 5 power. Skin:  B/L LE diffuse erythema, warm,non tender (continues to improve), Sacral pressure ulcer, stage 1.      Data Reviewed: I have personally reviewed following labs and imaging studies  CBC:  Recent Labs Lab 12/07/15 0745 12/07/15 1529 12/08/15 0446 12/09/15 0744  WBC 15.1* 12.4* 9.8 7.5  NEUTROABS 13.4* 10.8*  --   --   HGB 13.6 12.1* 11.8* 11.6*  HCT 40.3 38.9* 37.3* 36.2*  MCV 86.1 89.2 88.6 87.2  PLT 173 163 151 131*   Basic Metabolic Panel:  Recent Labs Lab 12/07/15 0745 12/07/15 1529 12/08/15 0446 12/08/15 1419 12/09/15 0744  NA 140 145 140  --  138  K 3.5 4.2 3.3* 3.8 3.3*  CL 105 109 107  --  104  CO2 25 29 24   --  25  GLUCOSE 155* 141* 120*  --  104*  BUN 25* 18 16  --  12  CREATININE 1.13 1.11 1.26*  --  1.04  CALCIUM 9.0 9.1 8.6*  --  8.3*   GFR: Estimated Creatinine Clearance: 87.7 mL/min (by C-G formula based on SCr of 1.04 mg/dL). Liver Function Tests:  Recent Labs Lab 12/07/15 0745 12/07/15 1529  AST 25 24  ALT 18 16*  ALKPHOS 80 71  BILITOT 1.4* 1.5*  PROT 7.5 6.8  ALBUMIN 4.4 3.7   No results for input(s): LIPASE, AMYLASE in the last 168 hours. No results for input(s): AMMONIA in the last 168 hours. Coagulation Profile:  Recent Labs Lab 12/07/15 1529  INR 1.90   Cardiac Enzymes:  Recent Labs Lab  12/07/15 0745 12/08/15 1419  TROPONINI 0.06* 0.50*   BNP (last 3 results) No results for input(s): PROBNP in the last 8760 hours. HbA1C: No results for input(s): HGBA1C in the last 72 hours. CBG: No results for input(s): GLUCAP in the last 168 hours. Lipid Profile: No results for input(s): CHOL, HDL, LDLCALC, TRIG, CHOLHDL, LDLDIRECT in the last 72 hours. Thyroid Function Tests:  Recent Labs  12/07/15 1529  TSH 0.735   Anemia Panel: No results for input(s): VITAMINB12, FOLATE, FERRITIN, TIBC, IRON, RETICCTPCT in the last 72 hours. Sepsis Labs:  Recent Labs Lab 12/07/15 0741 12/07/15 1043 12/07/15 1529 12/07/15 1744  PROCALCITON  --   --  0.16  --   LATICACIDVEN 1.40 0.95 1.7 1.2    Recent Results (from the past 240 hour(s))  Blood Culture (routine x 2)     Status: None (Preliminary result)   Collection Time: 12/07/15  7:38 AM  Result Value Ref Range Status   Specimen Description BLOOD LEFT AC  Final   Special Requests BOTTLES DRAWN AEROBIC AND ANAEROBIC 5CC  Final   Culture   Final    NO GROWTH 2 DAYS Performed at [facility]    Report Status PENDING  Incomplete  Blood Culture (routine x 2)     Status: None (Preliminary result)   Collection Time: 12/07/15  7:41 AM  Result Value Ref Range Status   Specimen Description BLOOD RT [facility]  Final   Special Requests BOTTLES DRAWN AEROBIC AND ANAEROBIC 5CC  Final   Culture   Final    NO GROWTH 2 DAYS Performed at [facility]    Report Status PENDING  Incomplete  Urine culture     Status: None   Collection Time: 12/07/15  9:03 AM  Result Value Ref Range Status   Specimen Description URINE, CLEAN CATCH  Final   Special Requests NONE  Final   Culture NO GROWTH Performed at [facility]   Final   Report Status 12/08/2015 FINAL  Final  Culture, blood (x 2)     Status: None (Preliminary result)   Collection Time: 12/07/15  3:30 PM  Result Value Ref Range Status   Specimen Description BLOOD LEFT  ARM  Final   Special Requests BOTTLES DRAWN AEROBIC AND ANAEROBIC 10CC  Final   Culture NO GROWTH 2 DAYS  Final   Report Status PENDING  Incomplete  Culture, blood (x 2)     Status: None (Preliminary result)   Collection Time: 12/07/15  4:17 PM  Result Value Ref Range Status   Specimen Description BLOOD LEFT ARM  Final   Special Requests BOTTLES DRAWN AEROBIC ONLY 4CC  Final   Culture NO GROWTH 2 DAYS  Final   Report Status PENDING  Incomplete      Radiology Studies: X-ray Chest Pa And Lateral  Result Date: 12/08/2015 CLINICAL DATA:  Shortness of breath, sepsis, hypertension, CHF, multiple scleroses. EXAM: CHEST  2 VIEW COMPARISON:  PA and lateral chest x-ray of December 07, 2015 FINDINGS: Patient is positioned in a lordotic matter on the frontal view. The lung markings at the right base have increased. The cardiac silhouette is subjectively enlarged but this may be projectional. The pulmonary vascularity is not clearly engorged. There is no pleural effusion. There is calcification in the wall of the aortic arch. There is multilevel degenerative disc disease of the thoracic spine. IMPRESSION: Slightly decreased inflation of both lungs. Probable right basilar atelectasis or early pneumonia. Enlargement of cardiac silhouette which may be related to the lordotic positioning. Aortic atherosclerosis. Electronically Signed   By: David  [place] M.D.   On: 12/08/2015 07:51      Scheduled Meds: . aspirin EC  81 mg Oral Daily  . atorvastatin  80 mg Oral q1800  . cholecalciferol  4,000 Units Oral Daily  . diltiazem  120 mg Oral Daily  . Dimethyl Fumarate  240 mg Oral BID  . furosemide  40 mg Oral BID  . gabapentin  300 mg Oral TID  . levothyroxine  100 mcg Oral QAC breakfast  . lisinopril  20 mg Oral Daily  . piperacillin-tazobactam (ZOSYN)  IV  3.375 g Intravenous Q8H  . potassium chloride  10 mEq Intravenous Q1 Hr x 2  . [START ON 12/10/2015] potassium chloride SA  40 mEq Oral Daily  .  Rivaroxaban  20 mg Oral Q supper  . sotalol  120 mg Oral BID  . vancomycin  750 mg Intravenous Q12H   Continuous Infusions:     LOS: 2 days   Time spent: 30 minutes  Lenox Ponds, MD [facility] Pager [phone]  If 7PM-7AM, please contact night-coverage [email or web address] Password [facility] 12/09/2015, 10:37 AM

## 2015-12-10 DIAGNOSIS — J189 Pneumonia, unspecified organism: Secondary | ICD-10-CM

## 2015-12-10 LAB — BASIC METABOLIC PANEL
ANION GAP: 8 (ref 5–15)
BUN: 13 mg/dL (ref 6–20)
CO2: 28 mmol/L (ref 22–32)
Calcium: 8.6 mg/dL — ABNORMAL LOW (ref 8.9–10.3)
Chloride: 105 mmol/L (ref 101–111)
Creatinine, Ser: 1 mg/dL (ref 0.61–1.24)
GFR calc Af Amer: >60 mL/min (ref >60)
GLUCOSE: 131 mg/dL — AB (ref 65–99)
POTASSIUM: 3.6 mmol/L (ref 3.5–5.1)
Sodium: 141 mmol/L (ref 135–145)

## 2015-12-10 LAB — MAGNESIUM: Magnesium: 2 mg/dL (ref 1.7–2.4)

## 2015-12-10 MED ORDER — GABAPENTIN 300 MG PO CAPS: 300.0000 mg | ORAL_CAPSULE | Freq: Three times a day (TID) | ORAL | Status: AC

## 2015-12-10 MED ORDER — SOTALOL HCL 120 MG PO TABS: 120.0000 mg | ORAL_TABLET | Freq: Two times a day (BID) | ORAL | 1 refills | Status: DC

## 2015-12-10 MED ORDER — POTASSIUM CHLORIDE CRYS ER 20 MEQ PO TBCR: 20.0000 meq | EXTENDED_RELEASE_TABLET | Freq: Two times a day (BID) | ORAL | Status: DC

## 2015-12-10 MED ORDER — CHOLECALCIFEROL 100 MCG (4000 UT) PO TABS: 4000.0000 [IU] | ORAL_TABLET | Freq: Every day | ORAL | 0 refills | Status: DC

## 2015-12-10 MED ORDER — FUROSEMIDE 20 MG PO TABS: 20.0000 mg | ORAL_TABLET | Freq: Every day | ORAL | Status: DC

## 2015-12-10 MED ORDER — AMOXICILLIN-POT CLAVULANATE 875-125 MG PO TABS: 1.0000 | ORAL_TABLET | Freq: Two times a day (BID) | ORAL | 0 refills | Status: AC

## 2015-12-10 MED ORDER — AZITHROMYCIN 500 MG PO TABS: 500.0000 mg | ORAL_TABLET | Freq: Every day | ORAL | 0 refills | Status: DC

## 2015-12-10 NOTE — Clinical Social Work Placement (Signed)
   CLINICAL SOCIAL WORK PLACEMENT  NOTE  Date:  12/10/2015  Patient Details  Name: Martin Powell MRN: [id number] Date of Birth: 1945/06/12  Clinical Social Work is seeking post-discharge placement for this patient at the Skilled  Nursing Facility level of care (*CSW will initial, date and re-position this form in  chart as items are completed):  Yes   Patient/family provided with [facility] [facility] Clinical Social Work Department's list of facilities offering this level of care within the geographic area requested by the patient (or if unable, by the patient's family).  Yes   Patient/family informed of their freedom to choose among providers that offer the needed level of care, that participate in Medicare, Medicaid or managed care program needed by the patient, have an available bed and are willing to accept the patient.  Yes   Patient/family informed of [facility]'s ownership interest in [facility] and [facility], as well as of the fact that they are under no obligation to receive care at these facilities.  PASRR submitted to EDS on       PASRR number received on       Existing PASRR number confirmed on 12/08/15     FL2 transmitted to all facilities in geographic area requested by pt/family on 12/08/15     FL2 transmitted to all facilities within larger geographic area on       Patient informed that his/her managed care company has contracts with or will negotiate with certain facilities, including the following:        Yes   Patient/family informed of bed offers received.  Patient chooses bed at [facility]     Physician recommends and patient chooses bed at      Patient to be transferred to [facility] on 12/10/15.  Patient to be transferred to facility by Ambulance     Patient family notified on 12/10/15 of transfer.  Name of family member notified:  Roosvelt Harps     PHYSICIAN Please sign FL2     Additional Comment:  Per MD  patient ready for DC to [facility]. RN, patient, patient's family, and facility notified of DC. RN given number for report. DC packet on chart. Ambulance transport requested for patient for 2:30PM pickup. CSW signing off.   _______________________________________________ Venita Lick, LCSW 12/10/2015, 1:44 PM

## 2015-12-10 NOTE — Discharge Summary (Addendum)
Physician Discharge Summary  Martin Powell ZOX:[id number] DOB: 1945-08-27 DOA: 12/07/2015  PCP: [facility]  Admit date: 12/07/2015 Discharge date: 12/10/2015  Admitted From: Home Disposition:  SNF - [facility]  Recommendations for Outpatient Follow-up:  1. Follow up with PCP in 1 week 2. Antibiotics for 10 days. 3. Physical therapy for strengthening.   Discharge Condition: Stable CODE STATUS: Full Diet recommendation: Heart healthy  Brief/Interim Summary:  Martin Powell a 70 y.o.malewith medical history significant of MS HTN, and Thyroid diseases, Diastolic CHF, admitted with sepsis likely secondary to PNA and Cellulitis of LE started on Zosyn and Vanc, WBC trending down, Leg cellulitis significantly improved. CXR show possible RLL PNA on 12/09/15. Patient went into A. Fib, which is chronic, heart rate in the 150s, Sotolol was increased with better heart rate control. Patient on a/c and Cardizem. Patient stable at this time, cultures no growth on 48 hrs. PT evaluation recommended short-term rehabilitation due to high fall risk. Denies shortness of breath, cough and chest pain. Patient will be discharged to short-term rehabilitation.   Discharge Diagnoses:  Principal Problem:   Sepsis (HCC) Active Problems:   Cellulitis LOWER EXTREMITIES.   CAP (community acquired pneumonia)   Diastolic heart failure (HCC)   Paroxysmal a-fib (HCC)   Multiple sclerosis (HCC)   Pressure ulcer   Thyroid activity decreased  Sepsis (HCC): Resolved, secondary to to acute on chronic cellulitis and PNA given fever and initial low O2 sat. Repeat CXR shows possible RLL PNA. Cultures no growth in > 48 hrs. WBC normal, no fever, erythema improving.   CAP due to nonspecific organism - improved - Augmentin twice a day for 10 days  Cellulitis LOWER EXTREMITIES: Improving Chronic condition, last treated on January 2017 - Augmentin twice a day for 10 days   PAF: Chronic hx of paroxymal afib. Patient on  Xarelto, Afib, heart rate well controlled  - Continue Cardizem - Sotalol 120 twice a day - Continue Xarelto   CHF (congestive heart failure) (HCC): Stable  LVEF 65-70%, Grade 2 diastolic dysfunction, No signs of fluid overload,  - Continue Bb, ACE and Lasix  Weakness lower extremities: Likely secondary to deconditioning in combination with MS - Short-term rehabilitation with physical therapy  Multiple sclerosis (HCC): Stable, MS could have explained LE weakness, which was resolved prior top getting admitted. Patient on Tecfidera 240mg  daily  - Continue home meds  Pressure ulcer: Stable, Sacral stage 1 pressure ulcer - Frequent turns and reposition q 2hrs  Thyroid activity decreased, Stable on Synthroid. Continue home meds. Will order TSH.    Discharge Instructions  Discharge Instructions    Diet - low sodium heart healthy    Complete by:  As directed   Discharge instructions    Complete by:  As directed   Discharge wound care:    Complete by:  As directed   Sacral Pressure Ulcer Stage 1   Increase activity slowly    Complete by:  As directed       Medication List    TAKE these medications   acetaminophen 325 MG tablet Commonly known as:  TYLENOL Take 325 mg by mouth every 6 (six) hours as needed for mild pain or moderate pain.   amoxicillin-clavulanate 875-125 MG tablet Commonly known as:  AUGMENTIN Take 1 tablet by mouth every 12 (twelve) hours.   aspirin EC 81 MG tablet Take 81 mg by mouth daily.   atorvastatin 80 MG tablet Commonly known as:  LIPITOR Take 80 mg by mouth daily.  azithromycin 500 MG tablet Commonly known as:  ZITHROMAX Take 1 tablet (500 mg total) by mouth daily.   Cholecalciferol 4000 units Tabs Take 4,000 Units by mouth daily. What changed:  medication strength   diltiazem 120 MG 24 hr capsule Commonly known as:  TIAZAC Take 120 mg by mouth daily.   furosemide 20 MG tablet Commonly known as:  LASIX Take 1 tablet (20 mg total) by  mouth daily. What changed:  how much to take  when to take this   gabapentin 300 MG capsule Commonly known as:  NEURONTIN Take 1 capsule (300 mg total) by mouth 3 (three) times daily. What changed:  when to take this   levothyroxine 100 MCG tablet Commonly known as:  SYNTHROID, LEVOTHROID Take 100 mcg by mouth daily before breakfast.   lisinopril 20 MG tablet Commonly known as:  PRINIVIL,ZESTRIL Take 20 mg by mouth daily.   pantoprazole 40 MG tablet Commonly known as:  PROTONIX Take 40 mg by mouth daily as needed (for reflux). To prevent acid reflux or bleeding (while on Xarelto).   potassium chloride SA 20 MEQ tablet Commonly known as:  K-DUR,KLOR-CON Take 1 tablet (20 mEq total) by mouth 2 (two) times daily.   Rivaroxaban 15 MG Tabs tablet Commonly known as:  XARELTO Take 15 mg by mouth daily with supper.   sotalol 120 MG tablet Commonly known as:  BETAPACE Take 1 tablet (120 mg total) by mouth 2 (two) times daily. What changed:  medication strength  how much to take   TECFIDERA 240 MG Cpdr Generic drug:  Dimethyl Fumarate Take 240 mg by mouth 2 (two) times daily.       No Known Allergies  Consultations:  None   Procedures/Studies: X-ray Chest Pa And Lateral  Result Date: 12/08/2015 CLINICAL DATA:  Shortness of breath, sepsis, hypertension, CHF, multiple scleroses. EXAM: CHEST  2 VIEW COMPARISON:  PA and lateral chest x-ray of December 07, 2015 FINDINGS: Patient is positioned in a lordotic matter on the frontal view. The lung markings at the right base have increased. The cardiac silhouette is subjectively enlarged but this may be projectional. The pulmonary vascularity is not clearly engorged. There is no pleural effusion. There is calcification in the wall of the aortic arch. There is multilevel degenerative disc disease of the thoracic spine. IMPRESSION: Slightly decreased inflation of both lungs. Probable right basilar atelectasis or early pneumonia.  Enlargement of cardiac silhouette which may be related to the lordotic positioning. Aortic atherosclerosis. Electronically Signed   By: David  [place] M.D.   On: 12/08/2015 07:51   Dg Chest 2 View  Result Date: 12/07/2015 CLINICAL DATA:  Acute onset weakness. Unable to get out of bed or stand. EXAM: CHEST  2 VIEW COMPARISON:  04/13/2015 FINDINGS: Mild cardiomegaly stable. Aortic atherosclerosis. Ectasia of thoracic aorta also demonstrated. Both lungs are clear. IMPRESSION: Mild cardiomegaly.  No active lung disease. Aortic atherosclerosis. Electronically Signed   By: Myles Rosenthal M.D.   On: 12/07/2015 08:14     Subjective: Patient seen and examined at bedside. No acute events overnight. Telemetry monitor with A. Fib. Denies chest pain, cough, SOB, dizziness and weakness. Patient reported no concerns today.     Discharge Exam: Vitals:   12/09/15 2322 12/10/15 0521  BP: 112/62 122/63  Pulse: 89 (!) 105  Resp: 17 17  Temp: 97.6 F (36.4 C) 98.2 F (36.8 C)   Vitals:   12/09/15 0535 12/09/15 1411 12/09/15 2322 12/10/15 0521  BP: 127/83 117/66 112/62  122/63  Pulse: (!) 120 99 89 (!) 105  Resp: 20 17 17 17   Temp: 97.8 F (36.6 C) 98.5 F (36.9 C) 97.6 F (36.4 C) 98.2 F (36.8 C)  TempSrc: Oral Oral Oral Oral  SpO2: 97% 95% 97% 97%  Weight:      Height:        General: Pt is alert, awake, not in acute distress Cardiovascular: Afib S1/S2 +, systolic murmur 4/6, no rubs, no gallops Respiratory: CTA bilaterally, no wheezing, no rhonchi Abdominal: Soft, NT, ND, bowel sounds + Extremities: trace edema, mild erythema bilateral shins, no cyanosis   The results of significant diagnostics from this hospitalization (including imaging, microbiology, ancillary and laboratory) are listed below for reference.     Microbiology: Recent Results (from the past 240 hour(s))  Blood Culture (routine x 2)     Status: None (Preliminary result)   Collection Time: 12/07/15  7:38 AM  Result Value  Ref Range Status   Specimen Description BLOOD LEFT AC  Final   Special Requests BOTTLES DRAWN AEROBIC AND ANAEROBIC 5CC  Final   Culture   Final    NO GROWTH 3 DAYS Performed at [facility]    Report Status PENDING  Incomplete  Blood Culture (routine x 2)     Status: None (Preliminary result)   Collection Time: 12/07/15  7:41 AM  Result Value Ref Range Status   Specimen Description BLOOD RT [facility]  Final   Special Requests BOTTLES DRAWN AEROBIC AND ANAEROBIC 5CC  Final   Culture   Final    NO GROWTH 3 DAYS Performed at [facility]    Report Status PENDING  Incomplete  Urine culture     Status: None   Collection Time: 12/07/15  9:03 AM  Result Value Ref Range Status   Specimen Description URINE, CLEAN CATCH  Final   Special Requests NONE  Final   Culture NO GROWTH Performed at [facility]   Final   Report Status 12/08/2015 FINAL  Final  Culture, blood (x 2)     Status: None (Preliminary result)   Collection Time: 12/07/15  3:30 PM  Result Value Ref Range Status   Specimen Description BLOOD LEFT ARM  Final   Special Requests BOTTLES DRAWN AEROBIC AND ANAEROBIC 10CC  Final   Culture NO GROWTH 3 DAYS  Final   Report Status PENDING  Incomplete  Culture, blood (x 2)     Status: None (Preliminary result)   Collection Time: 12/07/15  4:17 PM  Result Value Ref Range Status   Specimen Description BLOOD LEFT ARM  Final   Special Requests BOTTLES DRAWN AEROBIC ONLY 4CC  Final   Culture NO GROWTH 3 DAYS  Final   Report Status PENDING  Incomplete     Labs: BNP (last 3 results) No results for input(s): BNP in the last 8760 hours. Basic Metabolic Panel:  Recent Labs Lab 12/07/15 0745 12/07/15 1529 12/08/15 0446 12/08/15 1419 12/09/15 0744 12/10/15 0721  NA 140 145 140  --  138 141  K 3.5 4.2 3.3* 3.8 3.3* 3.6  CL 105 109 107  --  104 105  CO2 25 29 24   --  25 28  GLUCOSE 155* 141* 120*  --  104* 131*  BUN 25* 18 16  --  12 13  CREATININE 1.13 1.11  1.26*  --  1.04 1.00  CALCIUM 9.0 9.1 8.6*  --  8.3* 8.6*  MG  --   --   --   --   --  2.0   Liver Function Tests:  Recent Labs Lab 12/07/15 0745 12/07/15 1529  AST 25 24  ALT 18 16*  ALKPHOS 80 71  BILITOT 1.4* 1.5*  PROT 7.5 6.8  ALBUMIN 4.4 3.7   No results for input(s): LIPASE, AMYLASE in the last 168 hours. No results for input(s): AMMONIA in the last 168 hours. CBC:  Recent Labs Lab 12/07/15 0745 12/07/15 1529 12/08/15 0446 12/09/15 0744  WBC 15.1* 12.4* 9.8 7.5  NEUTROABS 13.4* 10.8*  --   --   HGB 13.6 12.1* 11.8* 11.6*  HCT 40.3 38.9* 37.3* 36.2*  MCV 86.1 89.2 88.6 87.2  PLT 173 163 151 131*   Cardiac Enzymes:  Recent Labs Lab 12/07/15 0745 12/08/15 1419  TROPONINI 0.06* 0.50*   BNP: Invalid input(s): POCBNP CBG: No results for input(s): GLUCAP in the last 168 hours. D-Dimer No results for input(s): DDIMER in the last 72 hours. Hgb A1c No results for input(s): HGBA1C in the last 72 hours. Lipid Profile No results for input(s): CHOL, HDL, LDLCALC, TRIG, CHOLHDL, LDLDIRECT in the last 72 hours. Thyroid function studies  Recent Labs  12/07/15 1529  TSH 0.735   Anemia work up No results for input(s): VITAMINB12, FOLATE, FERRITIN, TIBC, IRON, RETICCTPCT in the last 72 hours. Urinalysis    Component Value Date/Time   COLORURINE YELLOW 12/07/2015 0903   APPEARANCEUR CLEAR 12/07/2015 0903   LABSPEC 1.021 12/07/2015 0903   PHURINE 5.5 12/07/2015 0903   GLUCOSEU NEGATIVE 12/07/2015 0903   HGBUR SMALL (A) 12/07/2015 0903   BILIRUBINUR NEGATIVE 12/07/2015 0903   KETONESUR 15 (A) 12/07/2015 0903   PROTEINUR NEGATIVE 12/07/2015 0903   UROBILINOGEN 0.2 11/08/2014 2210   NITRITE NEGATIVE 12/07/2015 0903   LEUKOCYTESUR NEGATIVE 12/07/2015 0903   Sepsis Labs Invalid input(s): PROCALCITONIN,  WBC,  LACTICIDVEN Microbiology Recent Results (from the past 240 hour(s))  Blood Culture (routine x 2)     Status: None (Preliminary result)   Collection  Time: 12/07/15  7:38 AM  Result Value Ref Range Status   Specimen Description BLOOD LEFT AC  Final   Special Requests BOTTLES DRAWN AEROBIC AND ANAEROBIC 5CC  Final   Culture   Final    NO GROWTH 3 DAYS [facility]    Report Status PENDING  Incomplete  Blood Culture (routine x 2)     Status: None (Preliminary result)   Collection Time: 12/07/15  7:41 AM  Result Value Ref Range Status   [facility]  Final   Special Requests BOTTLES DRAWN AEROBIC AND ANAEROBIC 5CC  Final   Culture   Final    NO GROWTH 3 DAYS [facility]    Report Status PENDING  Incomplete  Urine culture     Status: None   Collection Time: 12/07/15  9:03 AM  Result Value Ref Range Status   Specimen Description URINE, CLEAN CATCH  Final   Special Requests NONE  Final   Culture NO GROWTH [facility]   Final   Report Status 12/08/2015 FINAL  Final  Culture, blood (x 2)     Status: None (Preliminary result)   Collection Time: 12/07/15  3:30 PM  Result Value Ref Range Status   Specimen Description BLOOD LEFT ARM  Final   Special Requests BOTTLES DRAWN AEROBIC AND ANAEROBIC 10CC  Final   Culture NO GROWTH 3 DAYS  Final   Report Status PENDING  Incomplete  Culture, blood (x 2)  Status: None (Preliminary result)   Collection Time: 12/07/15  4:17 PM  Result Value Ref Range Status   Specimen Description BLOOD LEFT ARM  Final   Special Requests BOTTLES DRAWN AEROBIC ONLY 4CC  Final   Culture NO GROWTH 3 DAYS  Final   Report Status PENDING  Incomplete     Time coordinating discharge: Less then 30 minutes  SIGNED:   Lenox Ponds, MD  [facility] 12/10/2015, 1:21 PM Pager   If 7PM-7AM, please contact night-coverage [email or web address] Password [email or web address]

## 2015-12-10 NOTE — Progress Notes (Signed)
Nsg Discharge Note  Admit Date:  12/07/2015 Discharge date: 12/10/2015   [name] to be D/C'd Skilled nursing facility per MD order.  AVS completed.  Copy for chart, and copy for patient signed, and dated. Patient/caregiver able to verbalize understanding.  Discharge Medication:   Medication List    TAKE these medications   acetaminophen 325 MG tablet Commonly known as:  TYLENOL Take 325 mg by mouth every 6 (six) hours as needed for mild pain or moderate pain.   amoxicillin-clavulanate 875-125 MG tablet Commonly known as:  AUGMENTIN Take 1 tablet by mouth every 12 (twelve) hours.   aspirin EC 81 MG tablet Take 81 mg by mouth daily.   atorvastatin 80 MG tablet Commonly known as:  LIPITOR Take 80 mg by mouth daily.   azithromycin 500 MG tablet Commonly known as:  ZITHROMAX Take 1 tablet (500 mg total) by mouth daily.   Cholecalciferol 4000 units Tabs Take 4,000 Units by mouth daily. What changed:  medication strength   diltiazem 120 MG 24 hr capsule Commonly known as:  TIAZAC Take 120 mg by mouth daily.   furosemide 20 MG tablet Commonly known as:  LASIX Take 1 tablet (20 mg total) by mouth daily. What changed:  how much to take  when to take this   gabapentin 300 MG capsule Commonly known as:  NEURONTIN Take 1 capsule (300 mg total) by mouth 3 (three) times daily. What changed:  when to take this   levothyroxine 100 MCG tablet Commonly known as:  SYNTHROID, LEVOTHROID Take 100 mcg by mouth daily before breakfast.   lisinopril 20 MG tablet Commonly known as:  PRINIVIL,ZESTRIL Take 20 mg by mouth daily.   pantoprazole 40 MG tablet Commonly known as:  PROTONIX Take 40 mg by mouth daily as needed (for reflux). To prevent acid reflux or bleeding (while on Xarelto).   potassium chloride SA 20 MEQ tablet Commonly known as:  K-DUR,KLOR-CON Take 1 tablet (20 mEq total) by mouth 2 (two) times daily.   Rivaroxaban 15 MG Tabs tablet Commonly known as:   XARELTO Take 15 mg by mouth daily with supper.   sotalol 120 MG tablet Commonly known as:  BETAPACE Take 1 tablet (120 mg total) by mouth 2 (two) times daily. What changed:  medication strength  how much to take   TECFIDERA 240 MG Cpdr Generic drug:  Dimethyl Fumarate Take 240 mg by mouth 2 (two) times daily.       Discharge Assessment: Vitals:   12/09/15 2322 12/10/15 0521  BP: 112/62 122/63  Pulse: 89 (!) 105  Resp: 17 17  Temp: 97.6 F (36.4 C) 98.2 F (36.8 C)   Skin clean, dry and intact without evidence of skin break down, no evidence of skin tears noted. IV catheter discontinued intact. Site without signs and symptoms of complications - no redness or edema noted at insertion site, patient denies c/o pain - only slight tenderness at site.  Dressing with slight pressure applied.  D/c Instructions-Education: Discharge instructions given to patient/family with verbalized understanding. D/c education completed with patient/family including follow up instructions, medication list, d/c activities limitations if indicated, with other d/c instructions as indicated by MD - patient able to verbalize understanding, all questions fully answered. Patient instructed to return to ED, call 911, or call MD for any changes in condition.  Patient transported via [facility] to [facility]  Camillo Flaming, RN 12/10/2015 1:58 PM

## 2015-12-12 ENCOUNTER — Encounter: Payer: Self-pay | Admitting: Internal Medicine

## 2015-12-12 ENCOUNTER — Non-Acute Institutional Stay (SKILLED_NURSING_FACILITY): Payer: Self-pay | Admitting: Internal Medicine

## 2015-12-12 DIAGNOSIS — G35 Multiple sclerosis: Secondary | ICD-10-CM

## 2015-12-12 DIAGNOSIS — L03119 Cellulitis of unspecified part of limb: Secondary | ICD-10-CM

## 2015-12-12 DIAGNOSIS — I5032 Chronic diastolic (congestive) heart failure: Secondary | ICD-10-CM

## 2015-12-12 DIAGNOSIS — R5381 Other malaise: Secondary | ICD-10-CM

## 2015-12-12 DIAGNOSIS — L8990 Pressure ulcer of unspecified site, unspecified stage: Secondary | ICD-10-CM

## 2015-12-12 DIAGNOSIS — Z8673 Personal history of transient ischemic attack (TIA), and cerebral infarction without residual deficits: Secondary | ICD-10-CM

## 2015-12-12 DIAGNOSIS — I480 Paroxysmal atrial fibrillation: Secondary | ICD-10-CM

## 2015-12-12 DIAGNOSIS — E782 Mixed hyperlipidemia: Secondary | ICD-10-CM

## 2015-12-12 DIAGNOSIS — E038 Other specified hypothyroidism: Secondary | ICD-10-CM

## 2015-12-12 LAB — CULTURE, BLOOD (ROUTINE X 2)
CULTURE: NO GROWTH
CULTURE: NO GROWTH
Culture: NO GROWTH
Culture: NO GROWTH

## 2015-12-12 NOTE — Progress Notes (Signed)
Patient ID: Martin Powell, male   DOB: 1945-09-20, 70 y.o.   MRN: [id number]    HISTORY AND PHYSICAL   DATE: 12/12/15  Location:    [facility] Room Number: 127 A Place of Service: SNF (31)   Extended Emergency Contact Information Primary Emergency Contact: Norman,Jackie  [place] Mobile Phone: [phone] Relation: Sister  Advanced Directive information Does patient have an advance directive?: Yes, Does patient want to make changes to advanced directive?: No - Patient declined  Chief Complaint  Patient presents with  . New Admit To SNF    HPI:  70 yo male seen today as a new admission into SNF following hospital stay for sepsis, CAP, BLE cellulitis, pressure ulcer, chronic diastolic HF, MS, PAF. He was tx with IV zosyn/vanco -->po augmentin through Sept 19th and azithromycin through 9/20th. Sotalol dose adjusted due to tachycardia. Cultures were no growth. [facility] He presents to SNF for short term rehab.  He takes Scientific laboratory technician for MS. He has no other concerns. He is followed by neurology. No nursing issues. Wound care following pressure sores. Appetite ok.   Hypothyroidism - stable on levothyroxine  CHF - stable on furosemide with Kdur, lisinopril  PAF - rate controlled on sotalol, tiazac. He takes ASA and xeralto for anticoagulation. Followed by cardio  Hyperlipidemia - stable on lipitor  MS/paraplegic - stable on Tecfidera. He takes gabapentin for neuropathy  GERD - stable on protonix  Hx CVA - no residual deficits. Takes ASA daily and statin  Past Medical History:  Diagnosis Date  . Cellulitis   . CHF (congestive heart failure) ([place])   . GERD (gastroesophageal reflux disease)   . Hypercholesteremia   . Hypertension   . Multiple sclerosis ([place])   . Murmur, heart   . Pneumonia 2016  . Stroke [facility]) 2016 X 2   denies residual on 12/07/2015  . Thyroid activity decreased     Past Surgical History:  Procedure Laterality Date  .  APPENDECTOMY    . ARTERIAL THROMBECTOMY  2016   "treated at [facility]; retrieved the clot in his brain"    Patient Care Team: [facility] as PCP - General  Social History   Social History  . Marital status: Single    Spouse name: N/A  . Number of children: N/A  . Years of education: N/A   Occupational History  . Not on file.   Social History Main Topics  . Smoking status: Never Smoker  . Smokeless tobacco: Never Used  . Alcohol use No  . Drug use: No  . Sexual activity: Not on file   Other Topics Concern  . Not on file   Social History Narrative  . No narrative on file     reports that he has never smoked. He has never used smokeless tobacco. He reports that he does not drink alcohol or use drugs.  Family History  Problem Relation Age of Onset  . CAD Neg Hx    No family status information on file.    Immunization History  Administered Date(s) Administered  . PPD Test 12/11/2015    No Known Allergies  Medications: Patient's Medications  New Prescriptions   No medications on file  Previous Medications   ACETAMINOPHEN (TYLENOL) 325 MG TABLET    Take 325 mg by mouth every 6 (six) hours as needed for mild pain or moderate pain.   AMOXICILLIN-CLAVULANATE (AUGMENTIN) 875-125 MG TABLET    Take 1 tablet by mouth  every 12 (twelve) hours.   ASPIRIN EC 81 MG TABLET    Take 81 mg by mouth daily.   ATORVASTATIN (LIPITOR) 80 MG TABLET    Take 80 mg by mouth daily.   CHOLECALCIFEROL 4000 UNITS TABS    Take 4,000 Units by mouth daily.   DILTIAZEM (TIAZAC) 120 MG 24 HR CAPSULE    Take 120 mg by mouth daily.   DIMETHYL FUMARATE (TECFIDERA) 240 MG CPDR    Take 240 mg by mouth 2 (two) times daily.    DOXYCYCLINE (VIBRAMYCIN) 100 MG CAPSULE    Take 100 mg by mouth 2 (two) times daily.   FUROSEMIDE (LASIX) 20 MG TABLET    Take 1 tablet (20 mg total) by mouth daily.   GABAPENTIN (NEURONTIN) 300 MG CAPSULE    Take 1 capsule (300 mg total) by mouth 3 (three) times daily.    LEVOTHYROXINE (SYNTHROID, LEVOTHROID) 100 MCG TABLET    Take 100 mcg by mouth daily before breakfast.   LISINOPRIL (PRINIVIL,ZESTRIL) 20 MG TABLET    Take 20 mg by mouth daily.   PANTOPRAZOLE (PROTONIX) 40 MG TABLET    Take 40 mg by mouth daily as needed (for reflux). To prevent acid reflux or bleeding (while on Xarelto).    POTASSIUM CHLORIDE SA (K-DUR,KLOR-CON) 20 MEQ TABLET    Take 1 tablet (20 mEq total) by mouth 2 (two) times daily.   RIVAROXABAN (XARELTO) 15 MG TABS TABLET    Take 15 mg by mouth daily with supper.   SACCHAROMYCES BOULARDII (FLORASTOR) 250 MG CAPSULE    Take 250 mg by mouth 2 (two) times daily.   SOTALOL (BETAPACE) 120 MG TABLET    Take 1 tablet (120 mg total) by mouth 2 (two) times daily.  Modified Medications   No medications on file  Discontinued Medications   AZITHROMYCIN (ZITHROMAX) 500 MG TABLET    Take 1 tablet (500 mg total) by mouth daily.    Review of Systems  Vitals:   12/12/15 1201  BP: (!) 144/95  Pulse: 78  Resp: 18  Temp: 97.7 F (36.5 C)  TempSrc: Oral  SpO2: 98%  Weight: 242 lb (109.8 kg)  Height: 6' 2"  (1.88 m)   Body mass index is 31.07 kg/m.  Physical Exam  Constitutional: He is oriented to person, place, and time. He appears well-developed.  Frail appearing in NAD  HENT:  Mouth/Throat: Oropharynx is clear and moist.  Eyes: Pupils are equal, round, and reactive to light. No scleral icterus.  Neck: Neck supple. Carotid bruit is not present. No thyromegaly present.  Cardiovascular: Normal rate and intact distal pulses.  An irregularly irregular rhythm present. Exam reveals no gallop and no friction rub.   Murmur heard.  Systolic murmur is present with a grade of 1/6  L>R calf swelling, NT. No pitting LE edema.  Pulmonary/Chest: Effort normal and breath sounds normal. He has no wheezes. He has no rales. He exhibits no tenderness.  Abdominal: Soft. Bowel sounds are normal. He exhibits no distension, no abdominal bruit, no pulsatile  midline mass and no mass. There is no hepatomegaly. There is no tenderness. There is no rebound and no guarding.  Musculoskeletal: He exhibits edema.  Lymphadenopathy:    He has no cervical adenopathy.  Neurological: He is alert and oriented to person, place, and time. He has normal reflexes.  paraplegic  Skin: Skin is warm and dry. No rash noted.  Pressure wounds per d/c summary  Psychiatric: He has a normal mood and  affect. His behavior is normal. Judgment and thought content normal.     Labs reviewed: Admission on 12/07/2015, Discharged on 12/10/2015  Component Date Value Ref Range Status  . Sodium 12/07/2015 140  135 - 145 mmol/L Final  . Potassium 12/07/2015 3.5  3.5 - 5.1 mmol/L Final  . Chloride 12/07/2015 105  101 - 111 mmol/L Final  . CO2 12/07/2015 25  22 - 32 mmol/L Final  . Glucose, Bld 12/07/2015 155* 65 - 99 mg/dL Final  . BUN 12/07/2015 25* 6 - 20 mg/dL Final  . Creatinine, Ser 12/07/2015 1.13  0.61 - 1.24 mg/dL Final  . Calcium 12/07/2015 9.0  8.9 - 10.3 mg/dL Final  . Total Protein 12/07/2015 7.5  6.5 - 8.1 g/dL Final  . Albumin 12/07/2015 4.4  3.5 - 5.0 g/dL Final  . AST 12/07/2015 25  15 - 41 U/L Final  . ALT 12/07/2015 18  17 - 63 U/L Final  . Alkaline Phosphatase 12/07/2015 80  38 - 126 U/L Final  . Total Bilirubin 12/07/2015 1.4* 0.3 - 1.2 mg/dL Final  . GFR calc non Af Amer 12/07/2015 >60  >60 mL/min Final  . GFR calc Af Amer 12/07/2015 >60  >60 mL/min Final   Comment: (NOTE) The eGFR has been calculated using the CKD EPI equation. This calculation has not been validated in all clinical situations. eGFR's persistently <60 mL/min signify possible Chronic Kidney Disease.   . Anion gap 12/07/2015 10  5 - 15 Final  . Lactic Acid, Venous 12/07/2015 1.40  0.5 - 1.9 mmol/L Final  . WBC 12/07/2015 15.1* 4.0 - 10.5 K/uL Final  . RBC 12/07/2015 4.68  4.22 - 5.81 MIL/uL Final  . Hemoglobin 12/07/2015 13.6  13.0 - 17.0 g/dL Final  . HCT 12/07/2015 40.3  39.0 -  52.0 % Final  . MCV 12/07/2015 86.1  78.0 - 100.0 fL Final  . MCH 12/07/2015 29.1  26.0 - 34.0 pg Final  . MCHC 12/07/2015 33.7  30.0 - 36.0 g/dL Final  . RDW 12/07/2015 14.7  11.5 - 15.5 % Final  . Platelets 12/07/2015 173  150 - 400 K/uL Final  . Neutrophils Relative % 12/07/2015 89  % Final  . Neutro Abs 12/07/2015 13.4* 1.7 - 7.7 K/uL Final  . Lymphocytes Relative 12/07/2015 3  % Final  . Lymphs Abs 12/07/2015 0.4* 0.7 - 4.0 K/uL Final  . Monocytes Relative 12/07/2015 7  % Final  . Monocytes Absolute 12/07/2015 1.1* 0.1 - 1.0 K/uL Final  . Eosinophils Relative 12/07/2015 1  % Final  . Eosinophils Absolute 12/07/2015 0.1  0.0 - 0.7 K/uL Final  . Basophils Relative 12/07/2015 0  % Final  . Basophils Absolute 12/07/2015 0.0  0.0 - 0.1 K/uL Final  . Color, Urine 12/07/2015 YELLOW  YELLOW Final  . APPearance 12/07/2015 CLEAR  CLEAR Final  . Specific Gravity, Urine 12/07/2015 1.021  1.005 - 1.030 Final  . pH 12/07/2015 5.5  5.0 - 8.0 Final  . Glucose, UA 12/07/2015 NEGATIVE  NEGATIVE mg/dL Final  . Hgb urine dipstick 12/07/2015 SMALL* NEGATIVE Final  . Bilirubin Urine 12/07/2015 NEGATIVE  NEGATIVE Final  . Ketones, ur 12/07/2015 15* NEGATIVE mg/dL Final  . Protein, ur 12/07/2015 NEGATIVE  NEGATIVE mg/dL Final  . Nitrite 12/07/2015 NEGATIVE  NEGATIVE Final  . Leukocytes, UA 12/07/2015 NEGATIVE  NEGATIVE Final  . Specimen Description 12/08/2015 URINE, CLEAN CATCH   Final  . Special Requests 12/08/2015 NONE   Final  . Culture 12/08/2015  Final                   Value:NO GROWTH Performed at [facility]   . Report Status 12/08/2015 12/08/2015 FINAL   Final  . Lactic Acid, Venous 12/07/2015 0.95  0.5 - 1.9 mmol/L Final  . Specimen Description 12/11/2015 BLOOD LEFT AC   Final  . Special Requests 12/11/2015 BOTTLES DRAWN AEROBIC AND ANAEROBIC 5CC   Final  . Culture 12/11/2015    Final                   Value:NO GROWTH 4 DAYS Performed at [facility]   . Report Status  12/11/2015 PENDING   Incomplete  . Specimen Description 12/11/2015 BLOOD RT [facility]   Final  . Special Requests 12/11/2015 BOTTLES DRAWN AEROBIC AND ANAEROBIC 5CC   Final  . Culture 12/11/2015    Final                   Value:NO GROWTH 4 DAYS Performed at [facility]   . Report Status 12/11/2015 PENDING   Incomplete  . Troponin I 12/07/2015 0.06* <0.03 ng/mL Final   Comment: CRITICAL RESULT CALLED TO, READ BACK BY AND VERIFIED WITH: CALLED TO A.HARTLEY RN AT [phone] ON [id number] BY SROY   . Squamous Epithelial / LPF 12/07/2015 0-5* NONE SEEN Final  . WBC, UA 12/07/2015 0-5  0 - 5 WBC/hpf Final  . RBC / HPF 12/07/2015 0-5  0 - 5 RBC/hpf Final  . Bacteria, UA 12/07/2015 FEW* NONE SEEN Final  . Specimen Description 12/11/2015 BLOOD LEFT ARM   Final  . Special Requests 12/11/2015 BOTTLES DRAWN AEROBIC AND ANAEROBIC 10CC   Final  . Culture 12/11/2015 NO GROWTH 4 DAYS   Final  . Report Status 12/11/2015 PENDING   Incomplete  . Specimen Description 12/11/2015 BLOOD LEFT ARM   Final  . Special Requests 12/11/2015 BOTTLES DRAWN AEROBIC ONLY 4CC   Final  . Culture 12/11/2015 NO GROWTH 4 DAYS   Final  . Report Status 12/11/2015 PENDING   Incomplete  . WBC 12/07/2015 12.4* 4.0 - 10.5 K/uL Final  . RBC 12/07/2015 4.36  4.22 - 5.81 MIL/uL Final  . Hemoglobin 12/07/2015 12.1* 13.0 - 17.0 g/dL Final  . HCT 12/07/2015 38.9* 39.0 - 52.0 % Final  . MCV 12/07/2015 89.2  78.0 - 100.0 fL Final  . MCH 12/07/2015 27.8  26.0 - 34.0 pg Final  . MCHC 12/07/2015 31.1  30.0 - 36.0 g/dL Final  . RDW 12/07/2015 14.6  11.5 - 15.5 % Final  . Platelets 12/07/2015 163  150 - 400 K/uL Final  . Neutrophils Relative % 12/07/2015 87  % Final  . Neutro Abs 12/07/2015 10.8* 1.7 - 7.7 K/uL Final  . Lymphocytes Relative 12/07/2015 5  % Final  . Lymphs Abs 12/07/2015 0.6* 0.7 - 4.0 K/uL Final  . Monocytes Relative 12/07/2015 8  % Final  . Monocytes Absolute 12/07/2015 1.0  0.1 - 1.0 K/uL Final  . Eosinophils Relative  12/07/2015 0  % Final  . Eosinophils Absolute 12/07/2015 0.0  0.0 - 0.7 K/uL Final  . Basophils Relative 12/07/2015 0  % Final  . Basophils Absolute 12/07/2015 0.0  0.0 - 0.1 K/uL Final  . Sodium 12/07/2015 145  135 - 145 mmol/L Final  . Potassium 12/07/2015 4.2  3.5 - 5.1 mmol/L Final  . Chloride 12/07/2015 109  101 - 111 mmol/L Final  . CO2 12/07/2015 29  22 -  32 mmol/L Final  . Glucose, Bld 12/07/2015 141* 65 - 99 mg/dL Final  . BUN 12/07/2015 18  6 - 20 mg/dL Final  . Creatinine, Ser 12/07/2015 1.11  0.61 - 1.24 mg/dL Final  . Calcium 12/07/2015 9.1  8.9 - 10.3 mg/dL Final  . Total Protein 12/07/2015 6.8  6.5 - 8.1 g/dL Final  . Albumin 12/07/2015 3.7  3.5 - 5.0 g/dL Final  . AST 12/07/2015 24  15 - 41 U/L Final  . ALT 12/07/2015 16* 17 - 63 U/L Final  . Alkaline Phosphatase 12/07/2015 71  38 - 126 U/L Final  . Total Bilirubin 12/07/2015 1.5* 0.3 - 1.2 mg/dL Final  . GFR calc non Af Amer 12/07/2015 >60  >60 mL/min Final  . GFR calc Af Amer 12/07/2015 >60  >60 mL/min Final   Comment: (NOTE) The eGFR has been calculated using the CKD EPI equation. This calculation has not been validated in all clinical situations. eGFR's persistently <60 mL/min signify possible Chronic Kidney Disease.   . Anion gap 12/07/2015 7  5 - 15 Final  . Lactic Acid, Venous 12/07/2015 1.7  0.5 - 1.9 mmol/L Final  . Lactic Acid, Venous 12/07/2015 1.2  0.5 - 1.9 mmol/L Final  . Procalcitonin 12/07/2015 0.16  ng/mL Final   Comment:        Interpretation: PCT (Procalcitonin) <= 0.5 ng/mL: Systemic infection (sepsis) is not likely. Local bacterial infection is possible. (NOTE)         ICU PCT Algorithm               Non ICU PCT Algorithm    ----------------------------     ------------------------------         PCT < 0.25 ng/mL                 PCT < 0.1 ng/mL     Stopping of antibiotics            Stopping of antibiotics       strongly encouraged.               strongly encouraged.     ----------------------------     ------------------------------       PCT level decrease by               PCT < 0.25 ng/mL       >= 80% from peak PCT       OR PCT 0.25 - 0.5 ng/mL          Stopping of antibiotics                                             encouraged.     Stopping of antibiotics           encouraged.    ----------------------------     ------------------------------       PCT level decrease by              PCT >= 0.25 ng/mL       < 80% from peak PCT        AND PCT >= 0.5 ng/mL            Continuin                          g antibiotics  encouraged.       Continuing antibiotics            encouraged.    ----------------------------     ------------------------------     PCT level increase compared          PCT > 0.5 ng/mL         with peak PCT AND          PCT >= 0.5 ng/mL             Escalation of antibiotics                                          strongly encouraged.      Escalation of antibiotics        strongly encouraged.   . Prothrombin Time 12/07/2015 22.0* 11.4 - 15.2 seconds Final  . INR 12/07/2015 1.90   Final  . aPTT 12/07/2015 34  24 - 36 seconds Final  . TSH 12/07/2015 0.735  0.350 - 4.500 uIU/mL Final  . WBC 12/08/2015 9.8  4.0 - 10.5 K/uL Final  . RBC 12/08/2015 4.21* 4.22 - 5.81 MIL/uL Final  . Hemoglobin 12/08/2015 11.8* 13.0 - 17.0 g/dL Final  . HCT 12/08/2015 37.3* 39.0 - 52.0 % Final  . MCV 12/08/2015 88.6  78.0 - 100.0 fL Final  . MCH 12/08/2015 28.0  26.0 - 34.0 pg Final  . MCHC 12/08/2015 31.6  30.0 - 36.0 g/dL Final  . RDW 12/08/2015 14.8  11.5 - 15.5 % Final  . Platelets 12/08/2015 151  150 - 400 K/uL Final  . Sodium 12/08/2015 140  135 - 145 mmol/L Final  . Potassium 12/08/2015 3.3* 3.5 - 5.1 mmol/L Final  . Chloride 12/08/2015 107  101 - 111 mmol/L Final  . CO2 12/08/2015 24  22 - 32 mmol/L Final  . Glucose, Bld 12/08/2015 120* 65 - 99 mg/dL Final  . BUN 12/08/2015 16  6 - 20 mg/dL Final    . Creatinine, Ser 12/08/2015 1.26* 0.61 - 1.24 mg/dL Final  . Calcium 12/08/2015 8.6* 8.9 - 10.3 mg/dL Final  . GFR calc non Af Amer 12/08/2015 56* >60 mL/min Final  . GFR calc Af Amer 12/08/2015 >60  >60 mL/min Final   Comment: (NOTE) The eGFR has been calculated using the CKD EPI equation. This calculation has not been validated in all clinical situations. eGFR's persistently <60 mL/min signify possible Chronic Kidney Disease.   . Anion gap 12/08/2015 9  5 - 15 Final  . Vancomycin Tr 12/08/2015 14* 15 - 20 ug/mL Final  . Troponin I 12/08/2015 0.50* <0.03 ng/mL Final   Comment: CRITICAL RESULT CALLED TO, READ BACK BY AND VERIFIED WITH: Arvella Merles RN [phone] [place]   . Potassium 12/08/2015 3.8  3.5 - 5.1 mmol/L Final  . Sodium 12/09/2015 138  135 - 145 mmol/L Final  . Potassium 12/09/2015 3.3* 3.5 - 5.1 mmol/L Final  . Chloride 12/09/2015 104  101 - 111 mmol/L Final  . CO2 12/09/2015 25  22 - 32 mmol/L Final  . Glucose, Bld 12/09/2015 104* 65 - 99 mg/dL Final  . BUN 12/09/2015 12  6 - 20 mg/dL Final  . Creatinine, Ser 12/09/2015 1.04  0.61 - 1.24 mg/dL Final  . Calcium 12/09/2015 8.3* 8.9 - 10.3 mg/dL Final  . GFR calc non Af Amer 12/09/2015 >60  >60 mL/min Final  .  GFR calc Af Amer 12/09/2015 >60  >60 mL/min Final   Comment: (NOTE) The eGFR has been calculated using the CKD EPI equation. This calculation has not been validated in all clinical situations. eGFR's persistently <60 mL/min signify possible Chronic Kidney Disease.   . Anion gap 12/09/2015 9  5 - 15 Final  . WBC 12/09/2015 7.5  4.0 - 10.5 K/uL Final  . RBC 12/09/2015 4.15* 4.22 - 5.81 MIL/uL Final  . Hemoglobin 12/09/2015 11.6* 13.0 - 17.0 g/dL Final  . HCT 12/09/2015 36.2* 39.0 - 52.0 % Final  . MCV 12/09/2015 87.2  78.0 - 100.0 fL Final  . MCH 12/09/2015 28.0  26.0 - 34.0 pg Final  . MCHC 12/09/2015 32.0  30.0 - 36.0 g/dL Final  . RDW 12/09/2015 14.8  11.5 - 15.5 % Final  . Platelets 12/09/2015 131* 150  - 400 K/uL Final  . Sodium 12/10/2015 141  135 - 145 mmol/L Final  . Potassium 12/10/2015 3.6  3.5 - 5.1 mmol/L Final  . Chloride 12/10/2015 105  101 - 111 mmol/L Final  . CO2 12/10/2015 28  22 - 32 mmol/L Final  . Glucose, Bld 12/10/2015 131* 65 - 99 mg/dL Final  . BUN 12/10/2015 13  6 - 20 mg/dL Final  . Creatinine, Ser 12/10/2015 1.00  0.61 - 1.24 mg/dL Final  . Calcium 12/10/2015 8.6* 8.9 - 10.3 mg/dL Final  . GFR calc non Af Amer 12/10/2015 >60  >60 mL/min Final  . GFR calc Af Amer 12/10/2015 >60  >60 mL/min Final   Comment: (NOTE) The eGFR has been calculated using the CKD EPI equation. This calculation has not been validated in all clinical situations. eGFR's persistently <60 mL/min signify possible Chronic Kidney Disease.   . Anion gap 12/10/2015 8  5 - 15 Final  . Magnesium 12/10/2015 2.0  1.7 - 2.4 mg/dL Final    X-ray Chest Pa And Lateral  Result Date: 12/08/2015 CLINICAL DATA:  Shortness of breath, sepsis, hypertension, CHF, multiple scleroses. EXAM: CHEST  2 VIEW COMPARISON:  PA and lateral chest x-ray of December 07, 2015 FINDINGS: Patient is positioned in a lordotic matter on the frontal view. The lung markings at the right base have increased. The cardiac silhouette is subjectively enlarged but this may be projectional. The pulmonary vascularity is not clearly engorged. There is no pleural effusion. There is calcification in the wall of the aortic arch. There is multilevel degenerative disc disease of the thoracic spine. IMPRESSION: Slightly decreased inflation of both lungs. Probable right basilar atelectasis or early pneumonia. Enlargement of cardiac silhouette which may be related to the lordotic positioning. Aortic atherosclerosis. Electronically Signed   By: David  Martinique M.D.   On: 12/08/2015 07:51   Dg Chest 2 View  Result Date: 12/07/2015 CLINICAL DATA:  Acute onset weakness. Unable to get out of bed or stand. EXAM: CHEST  2 VIEW COMPARISON:  04/13/2015 FINDINGS: Mild  cardiomegaly stable. Aortic atherosclerosis. Ectasia of thoracic aorta also demonstrated. Both lungs are clear. IMPRESSION: Mild cardiomegaly.  No active lung disease. Aortic atherosclerosis. Electronically Signed   By: Earle Gell M.D.   On: 12/07/2015 08:14     Assessment/Plan   ICD-9-CM ICD-10-CM   1. Cellulitis of lower extremity, unspecified laterality 682.6 L03.119   2. Physical deconditioning 799.3 R53.81   3. Chronic diastolic heart failure (HCC) 428.32 I50.32   4. Multiple sclerosis ([place]) 340 G35    paraplegic  5. Paroxysmal a-fib (HCC) 427.31 I48.0   6. Pressure ulcer, unspecified  location, unspecified ulcer stage 707.00 L89.90    707.20    7. Mixed hyperlipidemia 272.2 E78.2   8. Other specified hypothyroidism 244.8 E03.8   9. History of CVA (cerebrovascular accident) V12.54 Z86.73      Cont current meds as ordered. Complete abx as ordered  PT/OT/ST as ordered  Fluid restriction as ordered  F/u with specialists as scheduled  Wound care to follow pressure sores as ordered  GOAL: short term rehab and d/c home when medically appropriate. Communicated with pt and nursing.  Will follow  Greenlee Ancheta S. Perlie Gold  [facility] [address] [place], [place] [address] [phone] Cell (Monday-Friday 8 AM - 5 PM) [phone] After 5 PM and follow prompts

## 2015-12-30 ENCOUNTER — Encounter: Payer: Self-pay | Admitting: Adult Health

## 2015-12-30 ENCOUNTER — Non-Acute Institutional Stay (SKILLED_NURSING_FACILITY): Payer: Self-pay | Admitting: Adult Health

## 2015-12-30 DIAGNOSIS — G35 Multiple sclerosis: Secondary | ICD-10-CM

## 2015-12-30 DIAGNOSIS — I5032 Chronic diastolic (congestive) heart failure: Secondary | ICD-10-CM

## 2015-12-30 DIAGNOSIS — I480 Paroxysmal atrial fibrillation: Secondary | ICD-10-CM

## 2015-12-30 DIAGNOSIS — R5381 Other malaise: Secondary | ICD-10-CM

## 2015-12-30 NOTE — Progress Notes (Signed)
Patient ID: Martin Powell, male   DOB: [birth year], 70 y.o.   MRN: [id number]   Location:   [facility] Room Number: 127-A Place of Service:  SNF (31)    CODE STATUS: Full Code  No Known Allergies  Chief Complaint  Patient presents with  . Discharge Note    Discharge from Facility    HPI:  He is being discharged to home with home health for pt/ot/rn. He will not need dme; he has all necessary equipment at home. He will need his prescriptions written and will need to follow up with his pcp.  He had been hospitalized for sepsis and pneumonia. He was admitted to this facility for short term rehab.    Past Medical History:  Diagnosis Date  . Cellulitis   . CHF (congestive heart failure) (HCC)   . GERD (gastroesophageal reflux disease)   . Hypercholesteremia   . Hypertension   . Multiple sclerosis (HCC)   . Murmur, heart   . Pneumonia 2016  . Stroke [facility](HCC) 2016 X 2   denies residual on 12/07/2015  . Thyroid activity decreased     Past Surgical History:  Procedure Laterality Date  . APPENDECTOMY    . ARTERIAL THROMBECTOMY  2016   "[facility]; retrieved the clot in his brain"    Social History   Social History  . Marital status: Single    Spouse name: N/A  . Number of children: N/A  . Years of education: N/A   Occupational History  . Not on file.   Social History Main Topics  . Smoking status: Never Smoker  . Smokeless tobacco: Never Used  . Alcohol use No  . Drug use: No  . Sexual activity: Not on file   Other Topics Concern  . Not on file   Social History Narrative  . No narrative on file   Family History  Problem Relation Age of Onset  . CAD Neg Hx     VITAL SIGNS BP (!) 144/78   Pulse 82   Temp (!) 96.9 F (36.1 C) (Oral)   Resp 16   Ht 6\' 2"  (1.88 m)   Wt 241 lb (109.3 kg)   SpO2 98%   BMI 30.94 kg/m   Patient's Medications  New Prescriptions   No medications on file  Previous Medications   ACETAMINOPHEN (TYLENOL) 325  MG TABLET    Take 325 mg by mouth every 6 (six) hours as needed for mild pain or moderate pain.   ASPIRIN EC 81 MG TABLET    Take 81 mg by mouth daily.   ATORVASTATIN (LIPITOR) 80 MG TABLET    Take 80 mg by mouth daily.   CHOLECALCIFEROL 4000 UNITS TABS    Take 4,000 Units by mouth daily.   DILTIAZEM (TIAZAC) 120 MG 24 HR CAPSULE    Take 120 mg by mouth daily.   DIMETHYL FUMARATE (TECFIDERA) 240 MG CPDR    Take 240 mg by mouth 2 (two) times daily.    GABAPENTIN (NEURONTIN) 300 MG CAPSULE    Take 1 capsule (300 mg total) by mouth 3 (three) times daily.   LEVOTHYROXINE (SYNTHROID, LEVOTHROID) 100 MCG TABLET    Take 100 mcg by mouth daily before breakfast.   LISINOPRIL (PRINIVIL,ZESTRIL) 20 MG TABLET    Take 20 mg by mouth daily.   PANTOPRAZOLE (PROTONIX) 40 MG TABLET    Take 40 mg by mouth daily as needed (for reflux). To prevent acid reflux or bleeding (while on  Xarelto).    POTASSIUM CHLORIDE SA (K-DUR,KLOR-CON) 20 MEQ TABLET    Take 1 tablet (20 mEq total) by mouth 2 (two) times daily.   RIVAROXABAN (XARELTO) 15 MG TABS TABLET    Take 15 mg by mouth daily with supper.   SOTALOL (BETAPACE) 120 MG TABLET    Take 1 tablet (120 mg total) by mouth 2 (two) times daily.   TORSEMIDE (DEMADEX) 10 MG TABLET    Take 10 mg by mouth daily.  Modified Medications   No medications on file  Discontinued Medications   FUROSEMIDE (LASIX) 20 MG TABLET    Take 1 tablet (20 mg total) by mouth daily.     SIGNIFICANT DIAGNOSTIC EXAMS   LABS REVIEWED:   12-09-15: wbc 7.5; hgb 11.6; hct 36.2; mcv 87.2; plt 131  12-10-15: glucose 131; bun 13; creat 1.00; k+ 3.6; na++ 141   Review of Systems  Constitutional: Negative for malaise/fatigue.  Respiratory: Negative for cough and shortness of breath.   Cardiovascular: Negative for chest pain, palpitations and leg swelling.  Gastrointestinal: Negative for abdominal pain, constipation and heartburn.  Musculoskeletal: Negative for back pain, joint pain and myalgias.    Skin: Negative.   Neurological: Negative for dizziness.  Psychiatric/Behavioral: The patient is not nervous/anxious.     Physical Exam  Constitutional: No distress.  Eyes: Conjunctivae are normal.  Neck: Neck supple. No JVD present. No thyromegaly present.  Cardiovascular: Normal rate, regular rhythm and intact distal pulses.   Respiratory: Effort normal and breath sounds normal. No respiratory distress. He has no wheezes.  GI: Soft. Bowel sounds are normal. He exhibits no distension. There is no tenderness.  Musculoskeletal: He exhibits no edema.  Able to move all extremities   Lymphadenopathy:    He has no cervical adenopathy.  Neurological: He is alert.  Skin: Skin is warm and dry. He is not diaphoretic.  Psychiatric: He has a normal mood and affect.    ASSESSMENT/ PLAN:  Patient is being discharged with the following home health services:  Pt/ot/rn: to evaluate and treat as indicated for gait balance strength adl training and medication management   Patient is being discharged with the following durable medical equipment:  Non required; has all necessary equipment   Patient has been advised to f/u with their PCP in 1-2 weeks to bring them up to date on their rehab stay.  Social services at facility was responsible for arranging this appointment.  Pt was provided with a 30 day supply of prescriptions for medications and refills must be obtained from their PCP.  For controlled substances, a more limited supply may be provided adequate until PCP appointment only.   Time spent with patient  40   minutes >50% time spent counseling; reviewing medical record; tests; labs; and developing future plan of care   Synthia Innocent NP [facility]  Contact [phone] Monday through Friday 8am- 5pm  After hours call [phone]

## 2016-01-01 DIAGNOSIS — I110 Hypertensive heart disease with heart failure: Secondary | ICD-10-CM

## 2016-01-01 DIAGNOSIS — Z8701 Personal history of pneumonia (recurrent): Secondary | ICD-10-CM

## 2016-01-01 DIAGNOSIS — G35 Multiple sclerosis: Secondary | ICD-10-CM

## 2016-01-01 DIAGNOSIS — I5030 Unspecified diastolic (congestive) heart failure: Secondary | ICD-10-CM

## 2016-01-01 DIAGNOSIS — I480 Paroxysmal atrial fibrillation: Secondary | ICD-10-CM

## 2016-10-24 IMAGING — CR DG PELVIS 1-2V
1 series · 1 of 1 positions shown · non-contrast
Comparison: None.

CLINICAL DATA: Fall with pelvic pain

EXAM:
PELVIS - 1-2 VIEW

[pelvis ap]
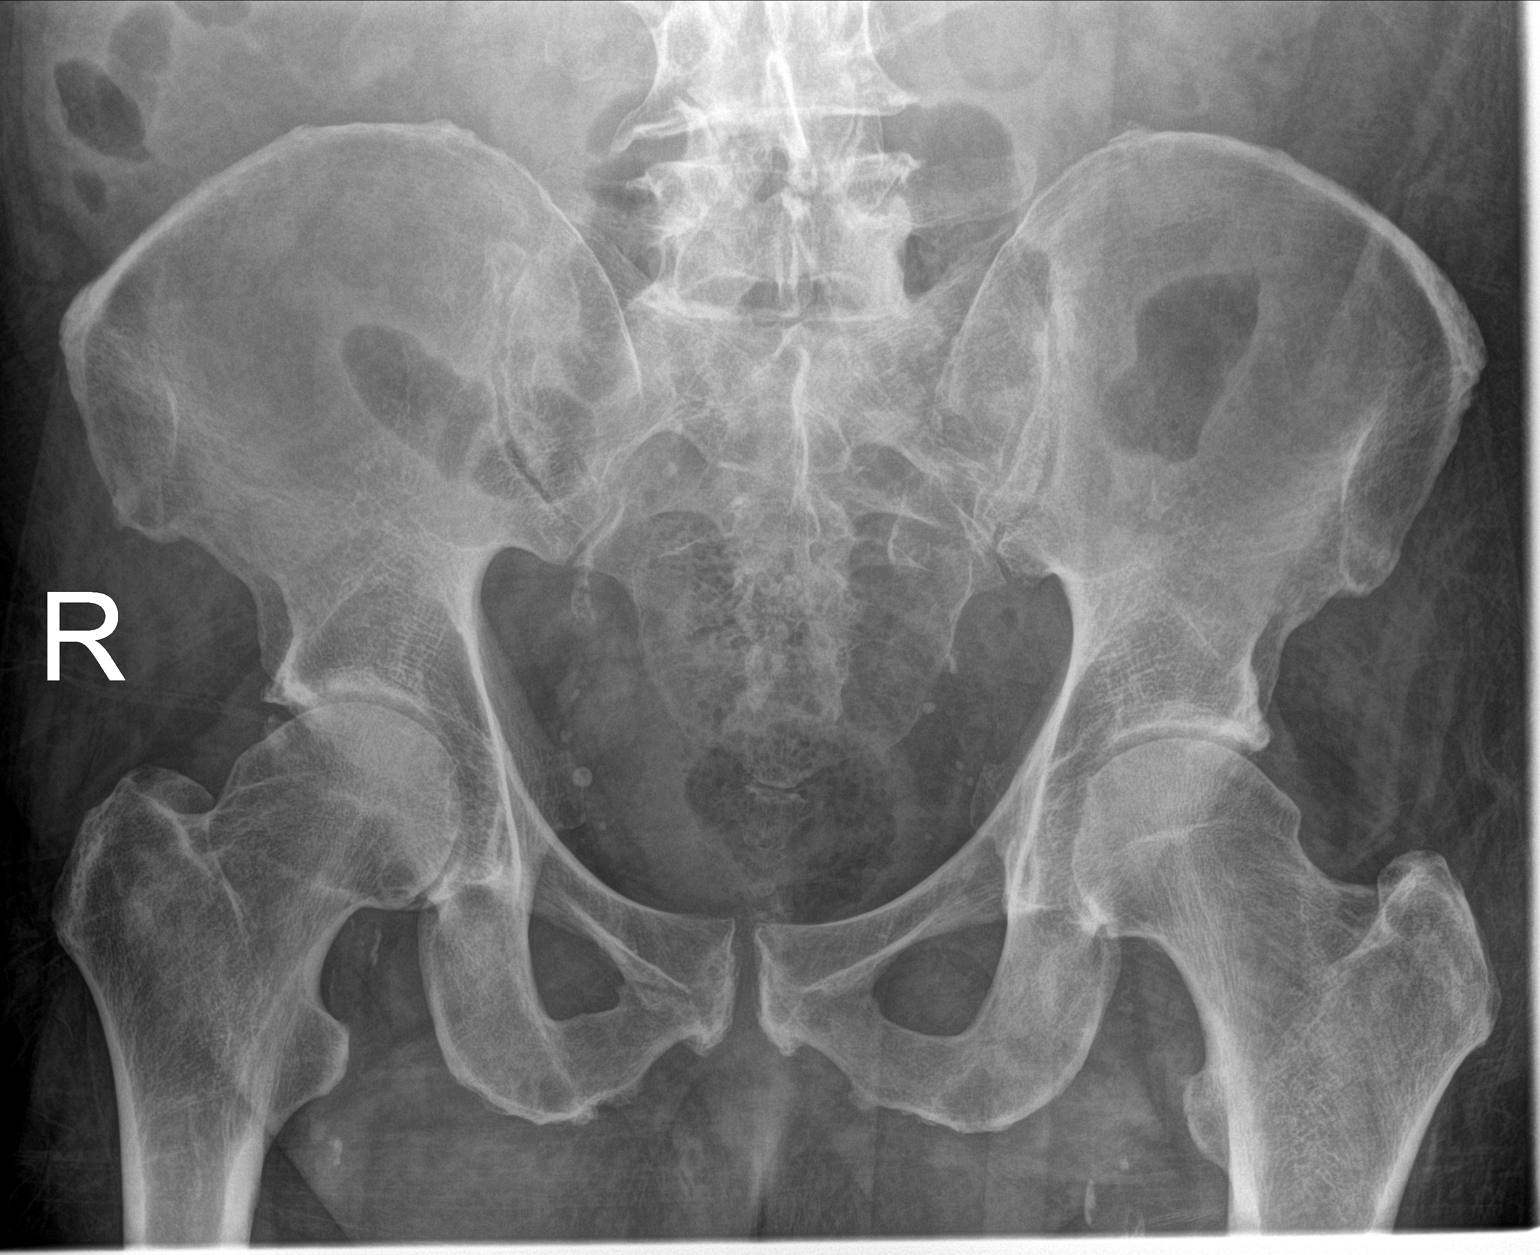

[1 of 1 positions shown; findings below may reference images not displayed]

FINDINGS: Pelvic ring is intact. Lucency is noted through the midportion of
the pubic symphysis on the right which may represent undisplaced
fracture. Correlation with physical exam is recommended. If
necessary CT may be helpful for further evaluation. Mild
degenerative changes of the hip joints are noted bilaterally.
Degenerative changes of lumbar spine are seen as well.
IMPRESSION: Lucency within the pubic symphysis on the right which may represent
undisplaced fracture. Correlation with the physical exam is
recommended.

## 2018-03-19 ENCOUNTER — Other Ambulatory Visit: Payer: Self-pay

## 2018-03-19 ENCOUNTER — Inpatient Hospital Stay (HOSPITAL_COMMUNITY)
Admission: EM | Admit: 2018-03-19 | Discharge: 2018-03-27 | DRG: 155 | Disposition: A | Discharge status: Skilled Nursing Facility | Payer: Medicare Other | Attending: Internal Medicine | Admitting: Internal Medicine

## 2018-03-19 ENCOUNTER — Encounter (HOSPITAL_COMMUNITY): Payer: Self-pay | Admitting: *Deleted

## 2018-03-19 ENCOUNTER — Emergency Department (HOSPITAL_COMMUNITY): Payer: Medicare Other

## 2018-03-19 DIAGNOSIS — I10 Essential (primary) hypertension: Secondary | ICD-10-CM | POA: Diagnosis present

## 2018-03-19 DIAGNOSIS — J342 Deviated nasal septum: Secondary | ICD-10-CM | POA: Diagnosis present

## 2018-03-19 DIAGNOSIS — S0121XA Laceration without foreign body of nose, initial encounter: Secondary | ICD-10-CM

## 2018-03-19 DIAGNOSIS — K219 Gastro-esophageal reflux disease without esophagitis: Secondary | ICD-10-CM | POA: Diagnosis present

## 2018-03-19 DIAGNOSIS — W010XXA Fall on same level from slipping, tripping and stumbling without subsequent striking against object, initial encounter: Secondary | ICD-10-CM | POA: Diagnosis present

## 2018-03-19 DIAGNOSIS — Y92009 Unspecified place in unspecified non-institutional (private) residence as the place of occurrence of the external cause: Secondary | ICD-10-CM | POA: Diagnosis not present

## 2018-03-19 DIAGNOSIS — Z9181 History of falling: Secondary | ICD-10-CM

## 2018-03-19 DIAGNOSIS — I2510 Atherosclerotic heart disease of native coronary artery without angina pectoris: Secondary | ICD-10-CM

## 2018-03-19 DIAGNOSIS — Z7982 Long term (current) use of aspirin: Secondary | ICD-10-CM

## 2018-03-19 DIAGNOSIS — S0512XA Contusion of eyeball and orbital tissues, left eye, initial encounter: Secondary | ICD-10-CM

## 2018-03-19 DIAGNOSIS — I454 Nonspecific intraventricular block: Secondary | ICD-10-CM | POA: Diagnosis not present

## 2018-03-19 DIAGNOSIS — S0990XA Unspecified injury of head, initial encounter: Secondary | ICD-10-CM | POA: Diagnosis present

## 2018-03-19 DIAGNOSIS — W01198A Fall on same level from slipping, tripping and stumbling with subsequent striking against other object, initial encounter: Secondary | ICD-10-CM | POA: Diagnosis not present

## 2018-03-19 DIAGNOSIS — R195 Other fecal abnormalities: Secondary | ICD-10-CM | POA: Diagnosis present

## 2018-03-19 DIAGNOSIS — Z79899 Other long term (current) drug therapy: Secondary | ICD-10-CM

## 2018-03-19 DIAGNOSIS — D649 Anemia, unspecified: Secondary | ICD-10-CM | POA: Diagnosis not present

## 2018-03-19 DIAGNOSIS — Z7901 Long term (current) use of anticoagulants: Secondary | ICD-10-CM

## 2018-03-19 DIAGNOSIS — E7800 Pure hypercholesterolemia, unspecified: Secondary | ICD-10-CM | POA: Diagnosis present

## 2018-03-19 DIAGNOSIS — E039 Hypothyroidism, unspecified: Secondary | ICD-10-CM | POA: Diagnosis present

## 2018-03-19 DIAGNOSIS — D62 Acute posthemorrhagic anemia: Secondary | ICD-10-CM | POA: Diagnosis present

## 2018-03-19 DIAGNOSIS — Z23 Encounter for immunization: Secondary | ICD-10-CM

## 2018-03-19 DIAGNOSIS — I480 Paroxysmal atrial fibrillation: Secondary | ICD-10-CM | POA: Diagnosis present

## 2018-03-19 DIAGNOSIS — Z8673 Personal history of transient ischemic attack (TIA), and cerebral infarction without residual deficits: Secondary | ICD-10-CM | POA: Diagnosis not present

## 2018-03-19 DIAGNOSIS — S022XXB Fracture of nasal bones, initial encounter for open fracture: Principal | ICD-10-CM | POA: Diagnosis present

## 2018-03-19 DIAGNOSIS — I252 Old myocardial infarction: Secondary | ICD-10-CM | POA: Diagnosis not present

## 2018-03-19 DIAGNOSIS — S01112A Laceration without foreign body of left eyelid and periocular area, initial encounter: Secondary | ICD-10-CM | POA: Diagnosis present

## 2018-03-19 DIAGNOSIS — G35 Multiple sclerosis: Secondary | ICD-10-CM | POA: Diagnosis present

## 2018-03-19 DIAGNOSIS — S022XXA Fracture of nasal bones, initial encounter for closed fracture: Secondary | ICD-10-CM

## 2018-03-19 DIAGNOSIS — W19XXXA Unspecified fall, initial encounter: Secondary | ICD-10-CM | POA: Diagnosis present

## 2018-03-19 DIAGNOSIS — S0181XA Laceration without foreign body of other part of head, initial encounter: Secondary | ICD-10-CM | POA: Diagnosis present

## 2018-03-19 DIAGNOSIS — Z79890 Hormone replacement therapy (postmenopausal): Secondary | ICD-10-CM

## 2018-03-19 HISTORY — DX: Unspecified place in unspecified non-institutional (private) residence as the place of occurrence of the external cause: W19.XXXA

## 2018-03-19 HISTORY — DX: Unspecified place in unspecified non-institutional (private) residence as the place of occurrence of the external cause: Y92.009

## 2018-03-19 LAB — CBC WITH DIFFERENTIAL/PLATELET
Abs Immature Granulocytes: 0.03 10*3/uL (ref 0.00–0.07)
BASOS ABS: 0.1 10*3/uL (ref 0.0–0.1)
Basophils Relative: 1 %
EOS PCT: 2 %
Eosinophils Absolute: 0.2 10*3/uL (ref 0.0–0.5)
HEMATOCRIT: 40.2 % (ref 39.0–52.0)
HEMOGLOBIN: 12.7 g/dL — AB (ref 13.0–17.0)
IMMATURE GRANULOCYTES: 0 %
LYMPHS ABS: 0.9 10*3/uL (ref 0.7–4.0)
LYMPHS PCT: 11 %
MCH: 26.8 pg (ref 26.0–34.0)
MCHC: 31.6 g/dL (ref 30.0–36.0)
MCV: 84.8 fL (ref 80.0–100.0)
Monocytes Absolute: 1 10*3/uL (ref 0.1–1.0)
Monocytes Relative: 12 %
NEUTROS ABS: 6.2 10*3/uL (ref 1.7–7.7)
NEUTROS PCT: 74 %
NRBC: 0 % (ref 0.0–0.2)
Platelets: 184 10*3/uL (ref 150–400)
RBC: 4.74 MIL/uL (ref 4.22–5.81)
RDW: 17.2 % — ABNORMAL HIGH (ref 11.5–15.5)
WBC: 8.4 10*3/uL (ref 4.0–10.5)

## 2018-03-19 LAB — BASIC METABOLIC PANEL
ANION GAP: 12 (ref 5–15)
BUN: 14 mg/dL (ref 8–23)
CALCIUM: 8.5 mg/dL — AB (ref 8.9–10.3)
CHLORIDE: 108 mmol/L (ref 98–111)
CO2: 22 mmol/L (ref 22–32)
CREATININE: 1.11 mg/dL (ref 0.61–1.24)
GFR calc non Af Amer: >60 mL/min (ref >60)
Glucose, Bld: 135 mg/dL — ABNORMAL HIGH (ref 70–99)
Potassium: 3.5 mmol/L (ref 3.5–5.1)
Sodium: 142 mmol/L (ref 135–145)

## 2018-03-19 LAB — TSH: TSH: 5.013 u[IU]/mL — ABNORMAL HIGH (ref 0.350–4.500)

## 2018-03-19 LAB — CK: Total CK: 267 U/L (ref 49–397)

## 2018-03-19 MED ORDER — TRANEXAMIC ACID 1000 MG/10ML IV SOLN
500.0000 mg | Freq: Once | INTRAVENOUS | Status: AC
  Administered 2018-03-19: 500 mg via TOPICAL
  Filled 2018-03-19: qty 10

## 2018-03-19 MED ORDER — ASPIRIN EC 81 MG PO TBEC
81.0000 mg | DELAYED_RELEASE_TABLET | Freq: Every day | ORAL | Status: DC
  Administered 2018-03-19 – 2018-03-27 (×8): 81 mg via ORAL
  Filled 2018-03-19 (×8): qty 1

## 2018-03-19 MED ORDER — DIMETHYL FUMARATE 240 MG PO CPDR
240.0000 mg | DELAYED_RELEASE_CAPSULE | Freq: Two times a day (BID) | ORAL | Status: DC
  Filled 2018-03-19 (×2): qty 1

## 2018-03-19 MED ORDER — GABAPENTIN 300 MG PO CAPS
300.0000 mg | ORAL_CAPSULE | Freq: Three times a day (TID) | ORAL | Status: DC
  Administered 2018-03-19 – 2018-03-27 (×23): 300 mg via ORAL
  Filled 2018-03-19 (×23): qty 1

## 2018-03-19 MED ORDER — RIVAROXABAN 15 MG PO TABS: 15.0000 mg | ORAL_TABLET | Freq: Every day | ORAL | Status: DC

## 2018-03-19 MED ORDER — OXYMETAZOLINE HCL 0.05 % NA SOLN
1.0000 | Freq: Once | NASAL | Status: AC
  Administered 2018-03-19: 1 via NASAL
  Filled 2018-03-19: qty 15

## 2018-03-19 MED ORDER — MIDAZOLAM HCL 2 MG/2ML IJ SOLN
2.0000 mg | Freq: Once | INTRAMUSCULAR | Status: AC
  Administered 2018-03-19: 2 mg via INTRAVENOUS
  Filled 2018-03-19: qty 2

## 2018-03-19 MED ORDER — CEPHALEXIN 250 MG PO CAPS
500.0000 mg | ORAL_CAPSULE | Freq: Once | ORAL | Status: AC
  Administered 2018-03-19: 500 mg via ORAL
  Filled 2018-03-19: qty 2

## 2018-03-19 MED ORDER — LEVOTHYROXINE SODIUM 100 MCG PO TABS
100.0000 ug | ORAL_TABLET | Freq: Every day | ORAL | Status: DC
  Administered 2018-03-20 – 2018-03-27 (×8): 100 ug via ORAL
  Filled 2018-03-19 (×8): qty 1

## 2018-03-19 MED ORDER — SOTALOL HCL 120 MG PO TABS
120.0000 mg | ORAL_TABLET | Freq: Two times a day (BID) | ORAL | Status: DC
  Administered 2018-03-19 – 2018-03-20 (×4): 120 mg via ORAL
  Filled 2018-03-19 (×5): qty 1

## 2018-03-19 MED ORDER — TETANUS-DIPHTH-ACELL PERTUSSIS 5-2.5-18.5 LF-MCG/0.5 IM SUSP
0.5000 mL | Freq: Once | INTRAMUSCULAR | Status: AC
  Administered 2018-03-19: 0.5 mL via INTRAMUSCULAR
  Filled 2018-03-19: qty 0.5

## 2018-03-19 MED ORDER — PANTOPRAZOLE SODIUM 40 MG PO TBEC
40.0000 mg | DELAYED_RELEASE_TABLET | Freq: Every day | ORAL | Status: DC | PRN
  Filled 2018-03-19: qty 1

## 2018-03-19 MED ORDER — ACETAMINOPHEN 500 MG PO TABS
1000.0000 mg | ORAL_TABLET | Freq: Four times a day (QID) | ORAL | Status: DC | PRN
  Administered 2018-03-21 – 2018-03-26 (×7): 1000 mg via ORAL
  Filled 2018-03-19 (×8): qty 2

## 2018-03-19 MED ORDER — HYDROCODONE-ACETAMINOPHEN 5-325 MG PO TABS: 1.0000 | ORAL_TABLET | Freq: Once | ORAL | Status: DC

## 2018-03-19 MED ORDER — LIDOCAINE-EPINEPHRINE 2 %-1:200000 IJ SOLN
20.0000 mL | Freq: Once | INTRAMUSCULAR | Status: AC
Start: 2018-03-19 — End: 2018-03-19
  Administered 2018-03-19: 20 mL
  Filled 2018-03-19: qty 20

## 2018-03-19 MED ORDER — DILTIAZEM HCL ER COATED BEADS 120 MG PO CP24
120.0000 mg | ORAL_CAPSULE | Freq: Every day | ORAL | Status: DC
  Administered 2018-03-19 – 2018-03-27 (×9): 120 mg via ORAL
  Filled 2018-03-19 (×11): qty 1

## 2018-03-19 MED ORDER — ATORVASTATIN CALCIUM 80 MG PO TABS
80.0000 mg | ORAL_TABLET | Freq: Every day | ORAL | Status: DC
  Administered 2018-03-19 – 2018-03-27 (×9): 80 mg via ORAL
  Filled 2018-03-19 (×9): qty 1

## 2018-03-19 MED ORDER — SULFAMETHOXAZOLE-TRIMETHOPRIM 800-160 MG PO TABS
1.0000 | ORAL_TABLET | Freq: Once | ORAL | Status: AC
  Administered 2018-03-19: 1 via ORAL
  Filled 2018-03-19: qty 1

## 2018-03-19 NOTE — ED Notes (Signed)
Pt stated pain in his back that may difficult get up and move for the moment.

## 2018-03-19 NOTE — Progress Notes (Addendum)
10:20am-CSW received call from MD. Rodena Medin seeking if placement would be found for pt today. CSW advised MD that CSW has reached out to [facility] Social worker with no response at this time therefore more than likely pt wouldn't be able to go to a facility today. Pt also still needing to be seen by PT at this time. CSW will continue to follow for further needs.    CSW acknowledges consult for possible SNF placement. CSW aware that is still needing to be assessed by PT for further discharge needs. 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 CSW reached out to [place] Social worker Monique for further ideas on where pt can be placed if needed. CSW left voicemail at this time for [facility].   Claude Manges Ardie Dragoo, MSW, LCSW-A Emergency Department Clinical Social Worker [phone]

## 2018-03-19 NOTE — ED Provider Notes (Addendum)
[facility] EMERGENCY DEPARTMENT Provider Note   CSN: [id number] Arrival date & time: 03/19/18  0042     History   Chief Complaint Chief Complaint  Patient presents with  . Fall  . Laceration    HPI Martin Powell is a 72 y.o. male.  HPI  This is a 72 year old male with a history of heart failure, hypertension, multiple sclerosis, stroke who presents following a fall.  Patient reports that he tripped and fell over his walker.  He fell face first.  Denies loss of consciousness.  Is reporting mostly nose pain and bleeding.  Denies any neck pain, chest pain, shortness of breath, abdominal pain, extremity pain.   Patient is on Xarelto for atrial fibrillation.  Unknown last tetanus shot.  Per EMS, was noted to have a large laceration above the left eyebrow and significant nasal bleeding.  Patient reports that his pain is "no different than normal."  Past Medical History:  Diagnosis Date  . Cellulitis   . CHF (congestive heart failure) (HCC)   . GERD (gastroesophageal reflux disease)   . Hypercholesteremia   . Hypertension   . Multiple sclerosis (HCC)   . Murmur, heart   . Pneumonia 2016  . Stroke [facility]) 2016 X 2   denies residual on 12/07/2015  . Thyroid activity decreased     Patient Active Problem List   Diagnosis Date Noted  . CAP (community acquired pneumonia) 12/10/2015  . Paroxysmal A-fib (HCC) 12/09/2015  . Fever 12/07/2015  . Diastolic heart failure (HCC)   . Murmur, heart   . Thyroid activity decreased   . Pulmonary hypertension (HCC)   . Scrotal swelling   . Cellulitis LOWER EXTREMITIES. 04/13/2015  . Sepsis (HCC) 04/13/2015  . History of CVA (cerebrovascular accident) 02/08/2015  . Venous insufficiency (chronic) (peripheral) 01/05/2015  . Fracture of right inferior pubic ramus (HCC) 10/06/2014  . Physical deconditioning 10/05/2014  . Pressure ulcer 10/03/2014  . Enteritis due to Clostridium difficile 10/03/2014  . Pelvic fracture (HCC)  10/03/2014  . Multiple sclerosis (HCC) 10/01/2014  . Hypercholesteremia 10/01/2014  . Hypotension 09/30/2014  . Dehydration 09/30/2014  . Hypothyroidism 09/30/2014  . Hyperlipidemia 09/30/2014    Past Surgical History:  Procedure Laterality Date  . APPENDECTOMY    . ARTERIAL THROMBECTOMY  2016   "treated at [facility]; retrieved the clot in his brain"        Home Medications    Prior to Admission medications   Medication Sig Start Date End Date Taking? Authorizing Provider  acetaminophen (TYLENOL) 325 MG tablet Take 325 mg by mouth every 6 (six) hours as needed for mild pain or moderate pain.   Yes [provider]  aspirin EC 81 MG tablet Take 81 mg by mouth daily.   Yes [provider]  atorvastatin (LIPITOR) 80 MG tablet Take 80 mg by mouth daily.   Yes [provider]  cholecalciferol 4000 units TABS Take 4,000 Units by mouth daily. 12/10/15  Yes Randel Pigg, Dorma Russell, MD  diltiazem [facility]) 120 MG 24 hr capsule Take 120 mg by mouth daily.   Yes [provider]  Dimethyl Fumarate (TECFIDERA) 240 MG CPDR Take 240 mg by mouth 2 (two) times daily.    Yes [provider]  gabapentin (NEURONTIN) 300 MG capsule Take 1 capsule (300 mg total) by mouth 3 (three) times daily. 12/10/15  Yes Randel Pigg, Dorma Russell, MD  levothyroxine (SYNTHROID, LEVOTHROID) 100 MCG tablet Take 100 mcg by mouth daily before breakfast.  Yes [provider]  lisinopril (PRINIVIL,ZESTRIL) 20 MG tablet Take 20 mg by mouth daily.   Yes [provider]  pantoprazole (PROTONIX) 40 MG tablet Take 40 mg by mouth daily as needed (for reflux). To prevent acid reflux or bleeding (while on Xarelto).    Yes [provider]  potassium chloride SA (K-DUR,KLOR-CON) 20 MEQ tablet Take 1 tablet (20 mEq total) by mouth 2 (two) times daily. 12/10/15  Yes Randel Pigg, Dorma Russell, MD  Rivaroxaban (XARELTO) 15 MG TABS tablet Take 15 mg by mouth daily with supper.   Yes [provider]  sotalol (BETAPACE) 120 MG tablet Take 1 tablet (120 mg total) by mouth 2 (two) times daily. 12/10/15  Yes Randel Pigg, Dorma Russell, MD  torsemide (DEMADEX) 10 MG tablet Take 10 mg by mouth daily.   Yes [provider]    Family History Family History  Problem Relation Age of Onset  . CAD Neg Hx     Social History Social History   Tobacco Use  . Smoking status: Never Smoker  . Smokeless tobacco: Never Used  Substance Use Topics  . Alcohol use: No  . Drug use: No     Allergies   Patient has no known allergies.   Review of Systems Review of Systems  HENT: Positive for nosebleeds.   Respiratory: Negative for shortness of breath.   Cardiovascular: Negative for chest pain.  Gastrointestinal: Negative for abdominal pain, nausea and vomiting.  Musculoskeletal: Negative for neck pain.  Skin: Positive for wound.  Neurological: Negative for syncope, weakness, numbness and headaches.  All other systems reviewed and are negative.    Physical Exam Updated Vital Signs BP (!) 128/96   Pulse (!) 110   Temp (!) 97.4 F (36.3 C) (Oral)   Resp 15   SpO2 92%   Physical Exam Vitals signs and nursing note reviewed.  Constitutional:      Appearance: He is well-developed.     Comments: ABCs intact  HENT:     Head: Normocephalic.     Comments: Dried blood noted about the nares and oropharynx, large clot in the left naris with oozing noted, diffuse swelling noted over the nasal bridge with abrasion noted over the right side of the nose, additional laceration over the tip of the nose at the left naris, hemostatic, large gaping 4 cm laceration over the left eyebrow Eyes:     Pupils: Pupils are equal, round, and reactive to light.  Neck:     Musculoskeletal: Neck supple.     Comments: C-collar in place Cardiovascular:     Rate and Rhythm: Normal rate.     Heart sounds: Normal heart sounds. No murmur.     Comments: Irregular rhythm Pulmonary:     Effort:  Pulmonary effort is normal. No respiratory distress.     Breath sounds: Normal breath sounds. No wheezing.  Abdominal:     General: Bowel sounds are normal.     Palpations: Abdomen is soft.     Tenderness: There is no abdominal tenderness. There is no rebound.  Musculoskeletal:     Comments: Normal range of motion of the bilateral lower extremities, no deformities noted, no tenderness to palpation  Lymphadenopathy:     Cervical: No cervical adenopathy.  Skin:    General: Skin is warm and dry.  Neurological:     Mental Status: He is alert and oriented to person, place, and time.  Psychiatric:        Mood and Affect:  Mood normal.      ED Treatments / Results  Labs (all labs ordered are listed, but only abnormal results are displayed) Labs Reviewed  CBC WITH DIFFERENTIAL/PLATELET  BASIC METABOLIC PANEL    EKG None  Radiology Ct Head Wo Contrast  Result Date: 03/19/2018 CLINICAL DATA:  Fall with facial laceration EXAM: CT HEAD WITHOUT CONTRAST CT MAXILLOFACIAL WITHOUT CONTRAST CT CERVICAL SPINE WITHOUT CONTRAST TECHNIQUE: Multidetector CT imaging of the head, cervical spine, and maxillofacial structures were performed using the standard protocol without intravenous contrast. Multiplanar CT image reconstructions of the cervical spine and maxillofacial structures were also generated. COMPARISON:  None. FINDINGS: CT HEAD FINDINGS Brain: There is no mass, hemorrhage or extra-axial collection. There is generalized atrophy without lobar predilection. There is hypoattenuation of the periventricular white matter, most commonly indicating chronic ischemic microangiopathy. Vascular: Atherosclerotic calcification of the internal carotid arteries at the skull base. No abnormal hyperdensity of the major intracranial arteries or dural venous sinuses. Skull: Large left frontal scalp hematoma.  No skull fracture. CT MAXILLOFACIAL FINDINGS Osseous: --Complex facial fracture types: No LeFort,  zygomaticomaxillary complex or nasoorbitoethmoidal fracture. --Simple fracture types: Minimally displaced fractures of the nasal bones. --Mandible: No fracture or dislocation. Orbits: The globes are intact. Normal appearance of the intra- and extraconal fat. Symmetric extraocular muscles and optic nerves. Sinuses: The nasal cavity is filled with blood. There is a small amount of blood within both maxillary sinuses. Soft tissues: Normal visualized extracranial soft tissues. CT CERVICAL SPINE FINDINGS Alignment: Grade 1 retrolisthesis at C3-4. Facets are aligned. Lateral masses of C1 and C2 are aligned with the occipital condyles. Skull base and vertebrae: No acute fracture. Soft tissues and spinal canal: No prevertebral fluid or swelling. No visible canal hematoma. Disc levels: Mild spinal canal stenosis at C3-4 due to disc osteophyte complex. There is multilevel neural foraminal stenosis, worst at C3-4 and C6-7. Upper chest: No pneumothorax, pulmonary nodule or pleural effusion. Other: Normal visualized paraspinal cervical soft tissues. IMPRESSION: 1. No acute intracranial abnormality. 2. Large left frontal scalp hematoma without skull fracture. 3. Minimally displaced fractures of the nasal bones. 4. No acute fracture of the cervical spine. 5. Brain parenchymal atrophy and chronic microvascular disease. Electronically Signed   By: Deatra Robinson M.D.   On: 03/19/2018 01:45   Ct Cervical Spine Wo Contrast  Result Date: 03/19/2018 CLINICAL DATA:  Fall with facial laceration EXAM: CT HEAD WITHOUT CONTRAST CT MAXILLOFACIAL WITHOUT CONTRAST CT CERVICAL SPINE WITHOUT CONTRAST TECHNIQUE: Multidetector CT imaging of the head, cervical spine, and maxillofacial structures were performed using the standard protocol without intravenous contrast. Multiplanar CT image reconstructions of the cervical spine and maxillofacial structures were also generated. COMPARISON:  None. FINDINGS: CT HEAD FINDINGS Brain: There is no mass,  hemorrhage or extra-axial collection. There is generalized atrophy without lobar predilection. There is hypoattenuation of the periventricular white matter, most commonly indicating chronic ischemic microangiopathy. Vascular: Atherosclerotic calcification of the internal carotid arteries at the skull base. No abnormal hyperdensity of the major intracranial arteries or dural venous sinuses. Skull: Large left frontal scalp hematoma.  No skull fracture. CT MAXILLOFACIAL FINDINGS Osseous: --Complex facial fracture types: No LeFort, zygomaticomaxillary complex or nasoorbitoethmoidal fracture. --Simple fracture types: Minimally displaced fractures of the nasal bones. --Mandible: No fracture or dislocation. Orbits: The globes are intact. Normal appearance of the intra- and extraconal fat. Symmetric extraocular muscles and optic nerves. Sinuses: The nasal cavity is filled with blood. There is a small amount of blood within both maxillary sinuses. Soft tissues:  Normal visualized extracranial soft tissues. CT CERVICAL SPINE FINDINGS Alignment: Grade 1 retrolisthesis at C3-4. Facets are aligned. Lateral masses of C1 and C2 are aligned with the occipital condyles. Skull base and vertebrae: No acute fracture. Soft tissues and spinal canal: No prevertebral fluid or swelling. No visible canal hematoma. Disc levels: Mild spinal canal stenosis at C3-4 due to disc osteophyte complex. There is multilevel neural foraminal stenosis, worst at C3-4 and C6-7. Upper chest: No pneumothorax, pulmonary nodule or pleural effusion. Other: Normal visualized paraspinal cervical soft tissues. IMPRESSION: 1. No acute intracranial abnormality. 2. Large left frontal scalp hematoma without skull fracture. 3. Minimally displaced fractures of the nasal bones. 4. No acute fracture of the cervical spine. 5. Brain parenchymal atrophy and chronic microvascular disease. Electronically Signed   By: Deatra Robinson M.D.   On: 03/19/2018 01:45   Ct Maxillofacial  Wo Contrast  Result Date: 03/19/2018 CLINICAL DATA:  Fall with facial laceration EXAM: CT HEAD WITHOUT CONTRAST CT MAXILLOFACIAL WITHOUT CONTRAST CT CERVICAL SPINE WITHOUT CONTRAST TECHNIQUE: Multidetector CT imaging of the head, cervical spine, and maxillofacial structures were performed using the standard protocol without intravenous contrast. Multiplanar CT image reconstructions of the cervical spine and maxillofacial structures were also generated. COMPARISON:  None. FINDINGS: CT HEAD FINDINGS Brain: There is no mass, hemorrhage or extra-axial collection. There is generalized atrophy without lobar predilection. There is hypoattenuation of the periventricular white matter, most commonly indicating chronic ischemic microangiopathy. Vascular: Atherosclerotic calcification of the internal carotid arteries at the skull base. No abnormal hyperdensity of the major intracranial arteries or dural venous sinuses. Skull: Large left frontal scalp hematoma.  No skull fracture. CT MAXILLOFACIAL FINDINGS Osseous: --Complex facial fracture types: No LeFort, zygomaticomaxillary complex or nasoorbitoethmoidal fracture. --Simple fracture types: Minimally displaced fractures of the nasal bones. --Mandible: No fracture or dislocation. Orbits: The globes are intact. Normal appearance of the intra- and extraconal fat. Symmetric extraocular muscles and optic nerves. Sinuses: The nasal cavity is filled with blood. There is a small amount of blood within both maxillary sinuses. Soft tissues: Normal visualized extracranial soft tissues. CT CERVICAL SPINE FINDINGS Alignment: Grade 1 retrolisthesis at C3-4. Facets are aligned. Lateral masses of C1 and C2 are aligned with the occipital condyles. Skull base and vertebrae: No acute fracture. Soft tissues and spinal canal: No prevertebral fluid or swelling. No visible canal hematoma. Disc levels: Mild spinal canal stenosis at C3-4 due to disc osteophyte complex. There is multilevel neural  foraminal stenosis, worst at C3-4 and C6-7. Upper chest: No pneumothorax, pulmonary nodule or pleural effusion. Other: Normal visualized paraspinal cervical soft tissues. IMPRESSION: 1. No acute intracranial abnormality. 2. Large left frontal scalp hematoma without skull fracture. 3. Minimally displaced fractures of the nasal bones. 4. No acute fracture of the cervical spine. 5. Brain parenchymal atrophy and chronic microvascular disease. Electronically Signed   By: Deatra Robinson M.D.   On: 03/19/2018 01:45    Procedures .Epistaxis Management Date/Time: 03/19/2018 7:55 AM Performed by: Shon Baton, MD Authorized by: Shon Baton, MD   Consent:    Consent obtained:  Verbal   Consent given by:  Patient   Risks discussed:  Bleeding and nasal injury   Alternatives discussed:  No treatment Procedure details:    Treatment site:  L septum and L anterior   Treatment method:  Anterior pack and nasal balloon   Treatment complexity:  Extensive   Treatment episode: initial   Post-procedure details:    Assessment:  Bleeding stopped   Patient  tolerance of procedure:  Tolerated well, no immediate complications Comments:     Initially brisk bleeding noted from the left naris associated with nasal bone fracture and exposure of septal cartilage.  TXA nebulizer, lidocaine with epi, and Afrin soaked cotton swab not effective.  Discussion with ENT to pack.  Patient continued to bleed after initial Rhino Rocket was placed.  Balloon Rhino Rocket 900 placed with some tamponade of bleeding.  However, a brisk bleed noted from the septal defect.  Vicryl stitch was placed with temporization of bleeding.   (including critical care time)  CRITICAL CARE Performed by: Shon Baton   Total critical care time: 50 minutes  Critical care time was exclusive of separately billable procedures and treating other patients.  Critical care was necessary to treat or prevent imminent or life-threatening  deterioration.  Critical care was time spent personally by me on the following activities: development of treatment plan with patient and/or surrogate as well as nursing, discussions with consultants, evaluation of patient's response to treatment, examination of patient, obtaining history from patient or surrogate, ordering and performing treatments and interventions, ordering and review of laboratory studies, ordering and review of radiographic studies, pulse oximetry and re-evaluation of patient's condition.   Medications Ordered in ED Medications  HYDROcodone-acetaminophen (NORCO/VICODIN) 5-325 MG per tablet 1 tablet (0 tablets Oral Hold 03/19/18 0747)  Tdap (BOOSTRIX) injection 0.5 mL (0.5 mLs Intramuscular Given 03/19/18 0219)  lidocaine-EPINEPHrine (XYLOCAINE W/EPI) 2 %-1:200000 (PF) injection 20 mL (20 mLs Infiltration Given 03/19/18 0219)  tranexamic acid (CYKLOKAPRON) injection 500 mg (500 mg Topical Given 03/19/18 0302)  oxymetazoline (AFRIN) 0.05 % nasal spray 1 spray (1 spray Each Nare Given 03/19/18 0304)  midazolam (VERSED) injection 2 mg (2 mg Intravenous Given 03/19/18 0712)  tranexamic acid (CYKLOKAPRON) injection 500 mg (500 mg Topical Given 03/19/18 0712)  sulfamethoxazole-trimethoprim (BACTRIM DS,SEPTRA DS) 800-160 MG per tablet 1 tablet (1 tablet Oral Given 03/19/18 0751)     Initial Impression / Assessment and Plan / ED Course  I have reviewed the triage vital signs and the nursing notes.  Pertinent labs & imaging results that were available during my care of the patient were reviewed by me and considered in my medical decision making (see chart for details).  Clinical Course as of Mar 19 754  Wed Mar 19, 2018  1610 Unable to get bleeding controlled.  Cannot fully visualize nasal septum to rule out septal hematoma.  He has exposed cartilage.  Continues to bleed briskly.  Given fractures and exposed cartilage, do not feel he is a good candidate for nasal packing.  He  also has significant swelling.  Will consult ENT for further evaluation.   [CH]  0505 Per secretary, multiple attempts to contact ENT failed.  Message has been left.  Facial lacerations were repaired.  We will continue to attempt to contact ENT.   [CH]  [phone] Unable to get up with ENT.  Forehead laceration was repaired by PA.  On recheck, patient continues to have oozing.  Cotton swab was removed patient has brisk bleeding from the left naris.  Still unable to visualize source of bleeding.  Patient not tolerating much of an exam.  Soaked cotton swab replaced to temporize and lessen bleeding.   [CH]  5409 Discussed with Dr. Annalee Genta, recommends packing.  Will also cover with antibiotics.   [CH]    Clinical Course User Index [CH] , Mayer Masker, MD    Patient presents with facial lacerations and epistaxis secondary to fall.  Reported mechanical fall.  On Xarelto.  CT scan only notable for nasal bone fracture.  He has brisk bleeding from the left naris that is difficult to assess where the origin of the bleeding is coming from given brisk bleeding.  Multiple attempts to temporize bleeding failed.  Discussion with ENT to pack and placed on antibiotics.  See procedure note above.  Patient required 1 Vicryl stitch over the nasal septum to stop a bleeder anterior to the packing.  Patient was in the emergency room for approximately 6 hours with ongoing bleeding.  He is in atrial fibrillation with heart rate in the 110.  He states he is always in atrial fibrillation and has no symptoms from this.  However, given the extension of his bleeding, lab work was added.  Would have low threshold for admission if patient rebleeds or is unable to ambulate safely on his own.  Patient signed out to Dr. Rodena Medin.  If he is discharged home, he will need to be discharged with antibiotics and ENT follow-up.  Final Clinical Impressions(s) / ED Diagnoses   Final diagnoses:  Open fracture of nasal bone, initial encounter    Laceration of forehead, initial encounter    ED Discharge Orders    None       , Mayer Masker, MD 03/19/18 0800    Shon Baton, MD 03/19/18 [phone]

## 2018-03-19 NOTE — Clinical Social Work Note (Signed)
Clinical Social Work Assessment  Patient Details  Name: Martin Powell MRN: [id number] Date of Birth: 1945/04/03  Date of referral:  03/19/18               Reason for consult:  Facility Placement, Discharge Planning                Permission sought to share information with:  Family Supports Permission granted to share information::  Yes, Verbal Permission Granted  Name::     Roosvelt Harps   Agency::  family  Relationship::  sister  Contact Information:  Roosvelt Harps [phone]  Housing/Transportation Living arrangements for the past 2 months:  Single Family Home(alone. ) Source of Information:  Patient Patient Interpreter Needed:  None Criminal Activity/Legal Involvement Pertinent to Current Situation/Hospitalization:  No - Comment as needed Significant Relationships:  Siblings Lives with:  Self Do you feel safe going back to the place where you live?  Yes Need for family participation in patient care:  Yes (Comment)  Care giving concerns:  CSW consulted for SNF placement once pt has been medically cleared.    Social Worker assessment / plan:  CSW spoke with pt at bedside. CSW informed that pt is from home alone. Per pt pt has a sister and her husband who check on pt periodically. Pt expressed that up until this time pt has been independent and able to care for self. CSW advised pt that at this time Pt is awaiting PT evaluation for further evaluation of mobility needs.   Employment status:  Retired Health and safety inspector:  [employer] PT Recommendations:  Not assessed at this time Information / Referral to community resources:  Skilled Nursing Facility  Patient/Family's Response to care:  Pt's response to care appeared to be understanding and agreeable at this time.   Patient/Family's Understanding of and Emotional Response to Diagnosis, Current Treatment, and Prognosis:  No further questions or concerns have been presented to CSW at this time. Emotional response to care was  hopeful of returning home once medical needs have been addressed.   Emotional Assessment Appearance:  Appears stated age Attitude/Demeanor/Rapport:  Engaged Affect (typically observed):  Appropriate Orientation:  Oriented to Self, Oriented to Place, Oriented to  Time, Oriented to Situation Alcohol / Substance use:  Not Applicable Psych involvement (Current and /or in the community):  No (Comment)  Discharge Needs  Concerns to be addressed:  Care Coordination Readmission within the last 30 days:  No Current discharge risk:  Dependent with Mobility Barriers to Discharge:  Continued Medical Work up   [employer], LCSWA 03/19/2018, 12:13 PM

## 2018-03-19 NOTE — ED Notes (Signed)
Small bowel movement, patient cleaned and new linens applied.

## 2018-03-19 NOTE — ED Provider Notes (Signed)
Patient seen after signout from prior provider.  Patient does not appear to have continued active epistaxis. Packing in place.   He is, however, unable to stand in a stable manner.  He appears to be a significant fall risk.  I do not feel he is safe for discharge.  Social work is aware of the case and they have reached out to the [place] for possible placement options.  This is unlikely to be resolved within the next 24 hours per social work.  Teaching team is aware of case and will evaluate for possible admission.   Wynetta Fines, MD 03/19/18 1022

## 2018-03-19 NOTE — ED Provider Notes (Signed)
..  Laceration Repair Date/Time: 03/19/2018 5:39 AM Performed by: Jeanie Sewer, PA-C Authorized by: Jeanie Sewer, PA-C   Consent:    Consent obtained:  Verbal   Consent given by:  Patient   Risks discussed:  Infection, need for additional repair, pain, poor cosmetic result and poor wound healing   Alternatives discussed:  No treatment and delayed treatment Universal protocol:    Procedure explained and questions answered to patient or proxy's satisfaction: yes     Relevant documents present and verified: yes     Test results available and properly labeled: yes     Imaging studies available: yes     Required blood products, implants, devices, and special equipment available: yes     Site/side marked: yes     Immediately prior to procedure, a time out was called: yes     Patient identity confirmed:  Verbally with patient and arm band Anesthesia (see MAR for exact dosages):    Anesthesia method:  Local infiltration   Local anesthetic:  Lidocaine 2% WITH epi Laceration details:    Location:  Face   Face location:  Forehead   Length (cm):  4   Depth (mm):  4 Repair type:    Repair type:  Simple Pre-procedure details:    Preparation:  Patient was prepped and draped in usual sterile fashion and imaging obtained to evaluate for foreign bodies Exploration:    Hemostasis achieved with:  Direct pressure   Wound exploration: wound explored through full range of motion and entire depth of wound probed and visualized     Wound extent: areolar tissue violated     Contaminated: yes   Treatment:    Area cleansed with:  Betadine   Amount of cleaning:  Extensive   Irrigation solution:  Sterile saline   Irrigation method:  Syringe   Visualized foreign bodies/material removed: no   Skin repair:    Repair method:  Sutures   Suture size:  5-0   Suture material:  Prolene   Suture technique:  Simple interrupted   Number of sutures:  8 Approximation:    Approximation:  Close Post-procedure  details:    Dressing:  Open (no dressing)   Patient tolerance of procedure:  Tolerated well, no immediate complications      Jeanie Sewer, PA-C 03/19/18 0539    Shon Baton, MD 03/19/18 [phone]

## 2018-03-19 NOTE — ED Triage Notes (Signed)
Pt home, tripped over his walker and fell face first. Lac to L eyebrow, deformity reported to bridge of nose. Blood noted in mouth and across nose; given two sprays of Afrin by EMS, bleeding controlled to nose at present. Pt is on Xarelto

## 2018-03-19 NOTE — H&P (Addendum)
Date: 03/19/2018               Patient Name:  Martin Powell MRN: [id number]  DOB: Oct 19, 1945 Age / Sex: 72 y.o., male   PCP: [facility]         Medical Service: Internal Medicine Teaching Service         Attending Physician: Dr. Inez Catalina, MD    First Contact: Dr. Petra Kuba  Pager: [phone]  Second Contact: Dr. Delma Officer Pager: [phone]       After Hours (After 5p/  First Contact Pager: [phone]  weekends / holidays): Second Contact Pager: [phone]   Chief Complaint: presented to the ED after a fall   History of Present Illness:   Martin Powell is a 72 year old man with paroxysmal atrial fibrillation ( on xarelto ), Multiple Sclerosis, hypothyroidism.  He states that he was using his walker and trying to water his plants in the living room when his walker got tangled up in the plants and he fell. He denies LOC or prodromal symptoms such as dizziness, chest pain, sob, blurry vision, palpitations. He does have weakness at this time but that is not unusual for him.   EMS arrived in his home at midnight and found him laying prone on the floor.  He said he had flipped over his walker and landed on his face and was denying neck or back pain or loss of consciousness.  He had a blood pressure 190/108, heart rate 110 oxygen saturation 98% on room air, blood glucose 134. He was noted to have laceration and hematoma over the face and left eye with deformity of the nasal bridge. He was placed in a C-spine collar and transported to [facility].   Meds:  Current Meds  Medication Sig  . acetaminophen (TYLENOL) 325 MG tablet Take 325 mg by mouth every 6 (six) hours as needed for mild pain or moderate pain.  Marland Kitchen aspirin EC 81 MG tablet Take 81 mg by mouth daily.  Marland Kitchen atorvastatin (LIPITOR) 80 MG tablet Take 80 mg by mouth daily.  . cholecalciferol 4000 units TABS Take 4,000 Units by mouth daily.  Marland Kitchen diltiazem (TIAZAC) 120 MG 24 hr capsule Take 120 mg by mouth daily.  . Dimethyl Fumarate  (TECFIDERA) 240 MG CPDR Take 240 mg by mouth 2 (two) times daily.   Marland Kitchen gabapentin (NEURONTIN) 300 MG capsule Take 1 capsule (300 mg total) by mouth 3 (three) times daily.  Marland Kitchen levothyroxine (SYNTHROID, LEVOTHROID) 100 MCG tablet Take 100 mcg by mouth daily before breakfast.  . lisinopril (PRINIVIL,ZESTRIL) 20 MG tablet Take 20 mg by mouth daily.  . pantoprazole (PROTONIX) 40 MG tablet Take 40 mg by mouth daily as needed (for reflux). To prevent acid reflux or bleeding (while on Xarelto).   . potassium chloride SA (K-DUR,KLOR-CON) 20 MEQ tablet Take 1 tablet (20 mEq total) by mouth 2 (two) times daily.  . Rivaroxaban (XARELTO) 15 MG TABS tablet Take 15 mg by mouth daily with supper.  . sotalol (BETAPACE) 120 MG tablet Take 1 tablet (120 mg total) by mouth 2 (two) times daily.  Marland Kitchen torsemide (DEMADEX) 10 MG tablet Take 10 mg by mouth daily.     Allergies: Allergies as of 03/19/2018  . (No Known Allergies)   Past Medical History:  Diagnosis Date  . Cellulitis   . CHF (congestive heart failure) (HCC)   . GERD (gastroesophageal reflux disease)   . Hypercholesteremia   . Hypertension   . Multiple  sclerosis (HCC)   . Murmur, heart   . Pneumonia 2016  . Stroke [facility]) 2016 X 2   denies residual on 12/07/2015  . Thyroid activity decreased     Family History:  Family History  Problem Relation Age of Onset  . CAD Neg Hx     Social History: Born in [place]. Served in the air[employer], went to [place]. 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 Lives alone in an apartment near [employer]. Has a home health aide that stays 6 days of the week and cooks. His sister (POA) lives nearby and pays his bills and helps out when the [facility] aide isn't there.  Social History   Socioeconomic History  . Marital status: Single    Spouse name: Not on file  . Number of children: Not on file  . Years of education: Not on file  . Highest education level: Not on file  Occupational History  . Not on file  Social Needs  .  Financial resource strain: Not on file  . Food insecurity:    Worry: Not on file    Inability: Not on file  . Transportation needs:    Medical: Not on file    Non-medical: Not on file  Tobacco Use  . Smoking status: Never Smoker  . Smokeless tobacco: Never Used  Substance and Sexual Activity  . Alcohol use: No  . Drug use: No  . Sexual activity: Not on file  Lifestyle  . Physical activity:    Days per week: Not on file    Minutes per session: Not on file  . Stress: Not on file  Relationships  . Social connections:    Talks on phone: Not on file    Gets together: Not on file    Attends religious service: Not on file    Active member of club or organization: Not on file    Attends meetings of clubs or organizations: Not on file    Relationship status: Not on file  . Intimate partner violence:    Fear of current or ex partner: Not on file    Emotionally abused: Not on file    Physically abused: Not on file    Forced sexual activity: Not on file  Other Topics Concern  . Not on file  Social History Narrative  . Not on file    Review of Systems: A complete ROS was negative except as per HPI.   Physical Exam: Blood pressure (!) 135/95, pulse (!) 130, temperature (!) 97.4 F (36.3 C), temperature source Oral, resp. rate 20, SpO2 95 %. General: no acute distress, multiple facial lacerations  Eye: there is a large hematoma over the left eye, multiple facial lacerations  HEENT: there are two drains extending from the left nostril Cardiac: Elevated rate, irregular rate, 1+ bilateral lower extremity edema without calf tenderness, peripheral pulse is stronger in the right radial artery than left  Pulmonary: Normal work of breathing, no wheezes or rhonchi GI: The abdomen is soft, nontender  Assessment & Plan by Problem: Active Problems:   Fall at home  Mechanical fall  Martin Powell is a 72 year old man with paroxysmal atrial fibrillation ( on xarelto ), Multiple Sclerosis,  hypothyroidism. He presents to the [facility] ED by EMS after a mechanical fall in his home resulting in a facial laceration. CT head, maxillofacial, and C spine performed in the ED revealed : No acute intracranial abnormality, Large left frontal scalp hematoma without skull fracture. Minimally displaced fractures of the  nasal bones. No acute fracture of the cervical spine. Brain parenchymal atrophy and chronic microvascular disease. He had repair of the laceration performed by providers in the ED and was preparing for discharge but when he attempted to ambulate he found that he was too weak and in pain to do so so he is admitted for further work-up. ENT was consulted in the ED and he was given a dose of bactrim and keflex - Orthostatic vitals testing to rule out orthostatic hypotension as a cause of his fall - PT and OT evaluation, we appreciate their recommendations - social work consulted for probable need for skilled nursing and rehabilitation  - follow up ENT recs   Anemia with increased RDW  Lab work was significant for hemoglobin of 12.7, consistent with his baseline. - we will monitor for signs of anemia   Hypo-thyroidism Follow-up TSH Continue home levothyroxine  Paroxysmal afib  Heart rate is elevated to mid 120s at the time of admission.  - Hold home xarelto for stroke ppx for now in the setting of extensive nose bleeding, will use SCDs for DVT ppx for now  - continue home diltiazem 120 mg qd and sotalol 120 mg BID for rate control   CAD  Reports he is post MI four months ago  - continue home aspirin, atorvastatin - hold lisinopril, and torsemide until after his rate control medications have been given and we are sure that his blood pressure can tolerate these as well   Multiple sclerosis  - continue home dimethyl fumarate and gabapeintin   GERD  - continue home protonix 40 mg daily   Dispo: Admit patient to Inpatient with expected length of stay greater than 2  midnights.  Signed: Eulah Pont, MD 03/19/2018, 12:22 PM  Pager: [phone]

## 2018-03-19 NOTE — ED Notes (Signed)
ED Provider at bedside. 

## 2018-03-20 DIAGNOSIS — W19XXXA Unspecified fall, initial encounter: Secondary | ICD-10-CM

## 2018-03-20 DIAGNOSIS — Z79890 Hormone replacement therapy (postmenopausal): Secondary | ICD-10-CM

## 2018-03-20 DIAGNOSIS — R040 Epistaxis: Secondary | ICD-10-CM

## 2018-03-20 DIAGNOSIS — E039 Hypothyroidism, unspecified: Secondary | ICD-10-CM

## 2018-03-20 DIAGNOSIS — G35 Multiple sclerosis: Secondary | ICD-10-CM

## 2018-03-20 DIAGNOSIS — R195 Other fecal abnormalities: Secondary | ICD-10-CM

## 2018-03-20 DIAGNOSIS — Z7901 Long term (current) use of anticoagulants: Secondary | ICD-10-CM

## 2018-03-20 DIAGNOSIS — S022XXB Fracture of nasal bones, initial encounter for open fracture: Principal | ICD-10-CM

## 2018-03-20 DIAGNOSIS — I480 Paroxysmal atrial fibrillation: Secondary | ICD-10-CM

## 2018-03-20 DIAGNOSIS — S0003XA Contusion of scalp, initial encounter: Secondary | ICD-10-CM

## 2018-03-20 DIAGNOSIS — R946 Abnormal results of thyroid function studies: Secondary | ICD-10-CM

## 2018-03-20 LAB — CBC
HCT: 32.6 % — ABNORMAL LOW (ref 39.0–52.0)
HCT: 30 % — ABNORMAL LOW (ref 39.0–52.0)
Hemoglobin: 10.3 g/dL — ABNORMAL LOW (ref 13.0–17.0)
Hemoglobin: 9.6 g/dL — ABNORMAL LOW (ref 13.0–17.0)
MCH: 26.6 pg (ref 26.0–34.0)
MCH: 27.1 pg (ref 26.0–34.0)
MCHC: 31.6 g/dL (ref 30.0–36.0)
MCHC: 32 g/dL (ref 30.0–36.0)
MCV: 84.2 fL (ref 80.0–100.0)
MCV: 84.7 fL (ref 80.0–100.0)
Platelets: 191 10*3/uL (ref 150–400)
Platelets: 219 10*3/uL (ref 150–400)
RBC: 3.87 MIL/uL — ABNORMAL LOW (ref 4.22–5.81)
RBC: 3.54 MIL/uL — AB (ref 4.22–5.81)
RDW: 17.6 % — AB (ref 11.5–15.5)
RDW: 17.4 % — ABNORMAL HIGH (ref 11.5–15.5)
WBC: 12 10*3/uL — ABNORMAL HIGH (ref 4.0–10.5)
WBC: 15.9 10*3/uL — ABNORMAL HIGH (ref 4.0–10.5)
nRBC: 0 % (ref 0.0–0.2)
nRBC: 0 % (ref 0.0–0.2)

## 2018-03-20 LAB — BASIC METABOLIC PANEL
Anion gap: 9 (ref 5–15)
BUN: 30 mg/dL — ABNORMAL HIGH (ref 8–23)
CO2: 23 mmol/L (ref 22–32)
Calcium: 8.3 mg/dL — ABNORMAL LOW (ref 8.9–10.3)
Chloride: 110 mmol/L (ref 98–111)
Creatinine, Ser: 0.99 mg/dL (ref 0.61–1.24)
GFR calc Af Amer: >60 mL/min (ref >60)
GFR calc non Af Amer: >60 mL/min (ref >60)
GLUCOSE: 132 mg/dL — AB (ref 70–99)
Potassium: 3.5 mmol/L (ref 3.5–5.1)
Sodium: 142 mmol/L (ref 135–145)

## 2018-03-20 LAB — OCCULT BLOOD X 1 CARD TO LAB, STOOL: Fecal Occult Bld: POSITIVE — AB

## 2018-03-20 MED ORDER — TERIFLUNOMIDE 14 MG PO TABS
1.0000 | ORAL_TABLET | Freq: Every day | ORAL | Status: DC
  Administered 2018-03-20: 18:00:00 via ORAL
  Administered 2018-03-21 – 2018-03-27 (×7): 1 via ORAL
  Filled 2018-03-20 (×8): qty 1

## 2018-03-20 NOTE — Progress Notes (Signed)
   Subjective: Laying in bed. Seems depressed. Concerned he will never walk again. Endorsing mild pain in his right foot  Objective:  Vital signs in last 24 hours: Vitals:   03/19/18 1807 03/19/18 2350 03/20/18 0352 03/20/18 0838  BP: 129/85 (!) 132/98 117/74 124/80  Pulse: 90 84 (!) 110 (!) 117  Resp:  (!) 22    Temp: 98 F (36.7 C) 99.3 F (37.4 C)  99.7 F (37.6 C)  TempSrc:  Oral  Oral  SpO2: 98% 95% 91% 95%   Gen: sitting up in bed, NAD but appears uncomfortable HENT: left eye hematoma, left nasal packing in place, clots of blood in the posterior oropharynx, tolerating secretions well.  Pulm: CTAB, speaking comfortably in full sentences  Ext: SCDs in place, bilateral LEE is improved, full ROM in right foot, non-tender to palpation  Assessment/Plan:  Active Problems:   Fall at home   Fall   Head injury   Laceration of forehead   Open fracture of nasal bones  [name] with PAF (on xarelto), MS, hypothyroidism who presented after a mechanical fall which resulted in left frontal scalp hematoma and minimally displaced fractures of the nasal bones. He was admitted for weakness and pain.   1. Epistaxis: ENT consulted in the ED and nasal packing was placed. He was also given bactrim and keflex. CT pertinent for minimally displaced nasal bones fracture. Overnight hemoglobin has dropped two points down to 10.3 - FOBT +. BUN is elevated so I suspect his + FOBT is due to digested blood from his epistaxis - repeat CBC this afternoon dropped to 9.6, no signs of active bleeding.  - continue holding eliquis and baby aspirin, can restart tomorrow if hemostasis remains stable.  - repeat CBC tomorrow morning  - f/u outpatient with ENT in 2 weeks   2. Mechanical Fall:  - PT/OT eval and treat: recommending SNF - f/u orthostatic VS  3. Elevated TSH: on levothyroxine. Consider increasing dose but will defer to outpatient setting after patient is over this acute event   Dispo: Anticipated  discharge in approximately 1 day(s).   Ali Lowe, MD 03/20/2018, 9:40 AM Pager: [phone]

## 2018-03-20 NOTE — Evaluation (Signed)
Physical Therapy Evaluation Patient Details Name: Martin Powell MRN: [id number] DOB: 1945/11/25 Today's Date: 03/20/2018   History of Present Illness  72 y.o. male admitted with nasal fx, scalp hematoma after mechanical fall at home 12/18. PMH includes: MS, A fib, MI 2019, CVA 2016, HTN, HLD, CHF. Head CT revealed no intracranial abnormality.     Clinical Impression  Pt admitted with above diagnosis. Pt currently with functional limitations due to the deficits listed below (see PT Problem List). History provided by daughter (POA). PTA pt living alone with 4x caretakers for ADLs, family the other 3x a week. Patient ambulates shot distances with RW or W/C for longer distances. Today patient sleeping upon entry and uneasy to awaken. Requiring max A to come to sitting and not tolerating sitting balance without hands on. Family concerned for patients safety at home, presets as high fall risk and will benefit from sub acute rehab, potentially requiring assisted living vs SNF longer term.  Pt will benefit from skilled PT to increase their independence and safety with mobility to allow discharge to the venue listed below.       Follow Up Recommendations SNF    Equipment Recommendations  (TBD next venue)    Recommendations for Other Services OT consult     Precautions / Restrictions Precautions Precautions: Fall Precaution Comments: Watch HR- afib  Restrictions Weight Bearing Restrictions: No      Mobility  Bed Mobility Overal bed mobility: Needs Assistance Bed Mobility: Supine to Sit;Sit to Supine     Supine to sit: Max assist;+2 for physical assistance Sit to supine: Max assist;+2 for physical assistance   General bed mobility comments: Max A to bring patient to sitting, patient in and out of sleep during visit with poor trunk control. assited BLE over and brought trunk to sitting.   Transfers                 General transfer comment: unable to maintain sitting balance,  deferred standing due to lethargy   Ambulation/Gait                Stairs            Wheelchair Mobility    Modified Rankin (Stroke Patients Only)       Balance Overall balance assessment: Needs assistance   Sitting balance-Leahy Scale: Poor       Standing balance-Leahy Scale: Poor                               Pertinent Vitals/Pain Pain Assessment: Faces Faces Pain Scale: Hurts a little bit Pain Location: nose Pain Descriptors / Indicators: Discomfort Pain Intervention(s): Limited activity within patient's tolerance    Home Living Family/patient expects to be discharged to:: Private residence Living Arrangements: Alone Available Help at Discharge: Available PRN/intermittently Type of Home: House       Home Layout: One level Home Equipment: Environmental consultant - 2 wheels;Grab bars - tub/shower      Prior Function Level of Independence: Needs assistance   Gait / Transfers Assistance Needed: RW and w/c     Comments: caretakers 4x/ a week for ADLs, family 3x'/ for ADLs     Hand Dominance        Extremity/Trunk Assessment   Upper Extremity Assessment Upper Extremity Assessment: Difficult to assess due to impaired cognition    Lower Extremity Assessment Lower Extremity Assessment: Difficult to assess due to impaired cognition  Communication   Communication: No difficulties  Cognition Arousal/Alertness: Lethargic Behavior During Therapy: WFL for tasks assessed/performed Overall Cognitive Status: History of cognitive impairments - at baseline                                 General Comments: family describes patients cognition has "on and off", poor memory or awarness at times      General Comments General comments (skin integrity, edema, etc.): Daughter (POA) present and disucssed safety concnerns for home.     Exercises     Assessment/Plan    PT Assessment Patient needs continued PT services  PT Problem  List Decreased strength       PT Treatment Interventions DME instruction;Gait training;Functional mobility training;Therapeutic activities;Therapeutic exercise;Balance training    PT Goals (Current goals can be found in the Care Plan section)  Acute Rehab PT Goals Patient Stated Goal: non stated(daugther wishes patient to get more support and rehab) PT Goal Formulation: With patient/family Time For Goal Achievement: 04/03/18 Potential to Achieve Goals: Fair    Frequency Min 2X/week   Barriers to discharge Decreased caregiver support pt lives alone    Co-evaluation               AM-PAC PT "6 Clicks" Mobility  Outcome Measure Help needed turning from your back to your side while in a flat bed without using bedrails?: A Lot Help needed moving from lying on your back to sitting on the side of a flat bed without using bedrails?: A Lot Help needed moving to and from a bed to a chair (including a wheelchair)?: Total Help needed standing up from a chair using your arms (e.g., wheelchair or bedside chair)?: Total Help needed to walk in hospital room?: Total Help needed climbing 3-5 steps with a railing? : Total 6 Click Score: 8    End of Session Equipment Utilized During Treatment: Gait belt Activity Tolerance: Patient limited by lethargy Patient left: in bed;with call bell/phone within reach;with nursing/sitter in room;with family/visitor present;with bed alarm set Nurse Communication: Mobility status PT Visit Diagnosis: Unsteadiness on feet (R26.81)    Time: [id number] PT Time Calculation (min) (ACUTE ONLY): 25 min   Charges:   PT Evaluation $PT Eval Moderate Complexity: 1 Mod PT Treatments $Therapeutic Activity: 8-22 mins       Etta GrandchildSean Mashawn Brazil, PT, DPT Acute Rehabilitation Services Pager: [phone] Office: [phone]   Etta GrandchildSean Koreena Joost 03/20/2018, 2:56 PM

## 2018-03-20 NOTE — Progress Notes (Addendum)
Current medication list -  medrec has been updated. His MS med was recently changed from Dimethyl Fumarate to Teriflumonide. Can look at bigger picture under Chart Review>>Media

## 2018-03-20 NOTE — Progress Notes (Signed)
Internal medicine paged regarding 3 large black bowel movements. Santos-Sanchez returned the page and made aware of the events. Will continue to closely monitor.

## 2018-03-20 NOTE — Progress Notes (Signed)
  Date: 03/20/2018  Patient name: Martin Powell  Medical record number: [id number]  Date of birth: 29-Aug-1945   I have seen and evaluated this patient and I have discussed the plan of care with the house staff. Please see Dr. Consuello Bossier note for complete details. I concur with her findings.    Inez Catalina, MD 03/20/2018, 8:03 PM

## 2018-03-20 NOTE — Progress Notes (Signed)
Paged by RN about dark stools x3. Patient admitted yesterday after a mechanical fall at home leading to nasal trauma and epistaxis. He is s/p nasal packing without further episodes of bleeding. Vital signs are stable and Hgb on admission was 12.7, which is at baseline. His home Xarelto is being held. He is also on PO protonix. Ordered FOBT and will follow AM CBC.   Burna CashIdalys Santos-Sanchez, MD  Internal Medicine PGY-2  P [phone]

## 2018-03-21 DIAGNOSIS — D62 Acute posthemorrhagic anemia: Secondary | ICD-10-CM

## 2018-03-21 DIAGNOSIS — Z79899 Other long term (current) drug therapy: Secondary | ICD-10-CM

## 2018-03-21 DIAGNOSIS — Z7982 Long term (current) use of aspirin: Secondary | ICD-10-CM

## 2018-03-21 DIAGNOSIS — S01112A Laceration without foreign body of left eyelid and periocular area, initial encounter: Secondary | ICD-10-CM

## 2018-03-21 LAB — CBC
HCT: 29.8 % — ABNORMAL LOW (ref 39.0–52.0)
HEMOGLOBIN: 9.2 g/dL — AB (ref 13.0–17.0)
MCH: 26.9 pg (ref 26.0–34.0)
MCHC: 30.9 g/dL (ref 30.0–36.0)
MCV: 87.1 fL (ref 80.0–100.0)
Platelets: 200 10*3/uL (ref 150–400)
RBC: 3.42 MIL/uL — ABNORMAL LOW (ref 4.22–5.81)
RDW: 18.4 % — ABNORMAL HIGH (ref 11.5–15.5)
WBC: 12.6 10*3/uL — ABNORMAL HIGH (ref 4.0–10.5)
nRBC: 0 % (ref 0.0–0.2)

## 2018-03-21 MED ORDER — DILTIAZEM HCL ER BEADS 120 MG PO CP24: 120.0000 mg | ORAL_CAPSULE | Freq: Every day | ORAL | Status: DC

## 2018-03-21 MED ORDER — AMIODARONE HCL 200 MG PO TABS
200.0000 mg | ORAL_TABLET | Freq: Two times a day (BID) | ORAL | Status: DC
  Administered 2018-03-21 – 2018-03-27 (×13): 200 mg via ORAL
  Filled 2018-03-21 (×13): qty 1

## 2018-03-21 MED ORDER — DILTIAZEM HCL ER COATED BEADS 120 MG PO CP24: 120.0000 mg | ORAL_CAPSULE | Freq: Every day | ORAL | Status: DC

## 2018-03-21 MED ORDER — CEPHALEXIN 500 MG PO CAPS: 500.0000 mg | ORAL_CAPSULE | Freq: Two times a day (BID) | ORAL | 0 refills | Status: DC

## 2018-03-21 MED ORDER — CEPHALEXIN 500 MG PO CAPS
500.0000 mg | ORAL_CAPSULE | Freq: Two times a day (BID) | ORAL | Status: DC
  Administered 2018-03-21 – 2018-03-24 (×8): 500 mg via ORAL
  Filled 2018-03-21 (×8): qty 1

## 2018-03-21 MED ORDER — RIVAROXABAN 20 MG PO TABS
20.0000 mg | ORAL_TABLET | Freq: Every day | ORAL | Status: DC
  Administered 2018-03-21 – 2018-03-26 (×6): 20 mg via ORAL
  Filled 2018-03-21 (×6): qty 1

## 2018-03-21 NOTE — NC FL2 (Addendum)
[place] MEDICAID FL2 LEVEL OF CARE SCREENING TOOL     IDENTIFICATION  Patient Name: Martin Powell Birthdate: 15-Dec-1945 Sex: male Admission Date (Current Location): 03/19/2018  [place] [place]   Facility and Address:  [facility], [address], [place], [place] [address]      Provider Number: [id number]  Attending Physician Name and Address:  Inez CatalinaMullen, Emily B, MD  Relative Name and Phone Number:       Current Level of Care: Hospital Recommended Level of Care: Skilled Nursing Facility Prior Approval Number:    Date Approved/Denied:   PASRR Number: [id number]  Discharge Plan: SNF  Confirmed [facility]    Current Diagnoses: Patient Active Problem List   Diagnosis Date Noted  . Fall at home 03/19/2018  . Fall 03/19/2018  . Head injury   . Laceration of forehead   . Open fracture of nasal bones   . CAP (community acquired pneumonia) 12/10/2015  . Paroxysmal A-fib (HCC) 12/09/2015  . Fever 12/07/2015  . Diastolic heart failure (HCC)   . Murmur, heart   . Thyroid activity decreased   . Pulmonary hypertension (HCC)   . Scrotal swelling   . Cellulitis LOWER EXTREMITIES. 04/13/2015  . Sepsis (HCC) 04/13/2015  . History of CVA (cerebrovascular accident) 02/08/2015  . Venous insufficiency (chronic) (peripheral) 01/05/2015  . Fracture of right inferior pubic ramus (HCC) 10/06/2014  . Physical deconditioning 10/05/2014  . Pressure ulcer 10/03/2014  . Enteritis due to Clostridium difficile 10/03/2014  . Pelvic fracture (HCC) 10/03/2014  . Multiple sclerosis (HCC) 10/01/2014  . Hypercholesteremia 10/01/2014  . Hypotension 09/30/2014  . Dehydration 09/30/2014  . Hypothyroidism 09/30/2014  . Hyperlipidemia 09/30/2014    Orientation RESPIRATION BLADDER Height & Weight     Self, Place, Situation  Normal Incontinent Weight:   Height:     BEHAVIORAL SYMPTOMS/MOOD NEUROLOGICAL BOWEL  NUTRITION STATUS      Incontinent Diet(heart healthy/thin liquids)  AMBULATORY STATUS COMMUNICATION OF NEEDS Skin   Extensive Assist Verbally Normal                       Personal Care Assistance Level of Assistance  Dressing, Feeding, Bathing Bathing Assistance: Maximum assistance Feeding assistance: Limited assistance Dressing Assistance: Maximum assistance     Functional Limitations Info  Sight, Hearing, Speech Sight Info: Adequate Hearing Info: Adequate Speech Info: Adequate    SPECIAL CARE FACTORS FREQUENCY  PT (By licensed PT), OT (By licensed OT)     PT Frequency: 2x OT Frequency: 2x            Contractures Contractures Info: Not present    Additional Factors Info  Code Status, Allergies Code Status Info: Full Code Allergies Info: NO known allergies           Current Medications (03/21/2018):  This is the current hospital active medication list Current Facility-Administered Medications  Medication Dose Route Frequency Provider Last Rate Last Dose  . acetaminophen (TYLENOL) tablet 1,000 mg  1,000 mg Oral Q6H PRN Eulah PontBlum, Nina, MD      . amiodarone (PACERONE) tablet 200 mg  200 mg Oral BID Ali LoweVogel, Marie S, MD      . aspirin EC tablet 81 mg  81 mg Oral Daily Eulah PontBlum, Nina, MD   81 mg at 03/19/18 1506  . atorvastatin (LIPITOR) tablet 80 mg  80 mg Oral Daily Eulah PontBlum, Nina, MD   80 mg at 03/20/18 0943  .  cephALEXin (KEFLEX) capsule 500 mg  500 mg Oral Q12H Ali Lowe, MD      . diltiazem (CARDIZEM CD) 24 hr capsule 120 mg  120 mg Oral Daily Eulah Pont, MD   120 mg at 03/20/18 0943  . gabapentin (NEURONTIN) capsule 300 mg  300 mg Oral TID Eulah Pont, MD   300 mg at 03/20/18 2122  . HYDROcodone-acetaminophen (NORCO/VICODIN) 5-325 MG per tablet 1 tablet  1 tablet Oral Once Eulah Pont, MD   Stopped at 03/19/18 [phone]  . levothyroxine (SYNTHROID, LEVOTHROID) tablet 100 mcg  100 mcg Oral Q0600 Eulah Pont, MD   100 mcg at 03/21/18 0555  . pantoprazole (PROTONIX) EC tablet  40 mg  40 mg Oral Daily PRN Eulah Pont, MD      . Teriflunomide TABS 1 tablet  1 tablet Oral Daily Inez Catalina, MD         Discharge Medications: Please see discharge summary for a list of discharge medications.  Relevant Imaging Results:  Relevant Lab Results:   Additional Information SSN: [id number]  !!!Confirmed pt is 100% service connected with [facility] Teana Lindahl, LCSW

## 2018-03-21 NOTE — Clinical Social Work Note (Addendum)
Pt is 100% service connected through [facility]. VA screening completed, signed by Resident, and fax[facility] to [facility]. Reviews are done at 11:00 AM. CSW will meet with pt's daughter at bedside at 10:00 to provide list of SNF's [facility] is contracted with to determine choice.   SavageBridget Santosh Petter, [place] [phone]

## 2018-03-21 NOTE — Care Management Important Message (Signed)
Important Message  Patient Details  Name: Martin Powell MRN: [id number] Date of Birth: [birth year]   Medicare Important Message Given:  Yes    Dorena Bodo 03/21/2018, 2:11 PM

## 2018-03-21 NOTE — Discharge Summary (Addendum)
Name: Martin Powell MRN: [id number] DOB: 1945-12-17 72 y.o. PCP: [facility]  Date of Admission: 03/19/2018 12:42 AM Date of Discharge: 03/22/18 Attending Physician: Inez Catalina, MD  Discharge Diagnosis: 1. Mechanical Fall 2. Nasal bone fractures 3. Epistaxis  4. Paroxysmal A. Fib 5. Multiple Sclerosis   Discharge Medications: Allergies as of 03/21/2018   No Known Allergies     Medication List    STOP taking these medications   diltiazem 240 MG 24 hr capsule Commonly known as:  DILACOR XR   potassium chloride SA 20 MEQ tablet Commonly known as:  K-DUR,KLOR-CON   sotalol 120 MG tablet Commonly known as:  BETAPACE     TAKE these medications   acetaminophen 325 MG tablet Commonly known as:  TYLENOL Take 325 mg by mouth every 6 (six) hours as needed for mild pain or moderate pain.   amiodarone 200 MG tablet Commonly known as:  PACERONE Take 200 mg by mouth 2 (two) times daily.   aspirin EC 81 MG tablet Take 81 mg by mouth daily.   atorvastatin 80 MG tablet Commonly known as:  LIPITOR Take 80 mg by mouth daily.   AUBAGIO 14 MG Tabs Generic drug:  Teriflunomide Take 1 tablet by mouth daily.   cephALEXin 500 MG capsule Commonly known as:  KEFLEX Take 1 capsule (500 mg total) by mouth every 12 (twelve) hours. While nasal packing in place   diltiazem 120 MG 24 hr capsule Commonly known as:  TIAZAC Take 1 capsule (120 mg total) by mouth daily.   diltiazem 120 MG 24 hr capsule Commonly known as:  CARDIZEM CD Take 1 capsule (120 mg total) by mouth daily. Start taking on:  March 22, 2018   furosemide 40 MG tablet Commonly known as:  LASIX Take 40 mg by mouth daily as needed (as needed for legs swelling).   gabapentin 300 MG capsule Commonly known as:  NEURONTIN Take 1 capsule (300 mg total) by mouth 3 (three) times daily.   levothyroxine 100 MCG tablet Commonly known as:  SYNTHROID, LEVOTHROID Take 100 mcg by mouth daily before  breakfast.   lisinopril 20 MG tablet Commonly known as:  PRINIVIL,ZESTRIL Take 20 mg by mouth daily.   magnesium oxide 400 MG tablet Commonly known as:  MAG-OX Take 400 mg by mouth 2 (two) times daily.   pantoprazole 40 MG tablet Commonly known as:  PROTONIX Take 40 mg by mouth daily as needed (for reflux). To prevent acid reflux or bleeding (while on Xarelto).   rivaroxaban 20 MG Tabs tablet Commonly known as:  XARELTO Take 20 mg by mouth daily with supper.   VITAMIN D3 SUPER STRENGTH 50 MCG (2000 UT) Tabs Generic drug:  Cholecalciferol Take 1 tablet by mouth.       Disposition and follow-up:   Martin Powell was discharged from [facility] in Stable condition.  At the hospital follow up visit please address:  1.  Remove nasal packing (Balloon Rhino Rocket 900) on 03/23/18. Left forehead sutures are absorbable.   Please schedule VA ENT f/u between 1/1-1/8 to follow up nasal bone fractures.   Xarelto and aspirin were held for 48hrs and resumed on discharge.  Diltiazem XR was decreased to 120mg , HR 80 on discharge. Can increase back to home 240mg  dose as needed.    2.  Labs / imaging needed at time of follow-up: CBC to check hemoglobin (was 8.7 on discharge), TSH (was elevated during admission, no changes were made to  levothyroxine dose)  3.  Pending labs/ test needing follow-up: none   Follow-up Appointments: Follow-up Information    Osborn Coho, MD Follow up.   Specialty:  Otolaryngology Why:  This is who saw you in the hospital. If you can't get in with the [facility], maybe you can be seen here for your fractured nose Contact information: [address] [place] [address] [phone]        [facility] Call in 2 week(s).   Why:  to manage diltiazem dosing and check your blood counts Contact information: [address] [facility] Freada Bergeron [place] [address] [phone]           Hospital Course by  problem list: 72yo man with paroxysmal a. Fib, on xarelto, multiple sclerosis, hypothyroidism who presented to the ED after a mechanical fall resulting in facial laceration (forehead), left eye hematoma, minimally displaced nasal bone fractures, and significant epistaxis.  Mechanical Fall: his walker got caught on something while he was watering plants. Denies prodromal symptoms. On PT eval, he was too weak to ambulate so he was discharged to SNF.   Large, gaping 4cm facial laceration over the left eyebrow was repaired with 8 simple interrupted sutures using absorbable 5-0 prolene.  Epistaxis: Brisk bleeding from left naris with nasal bone fracture and exporsure of septal cartilage. Xarelto was held for 48hrs. CT revealed minimally displaced nasal bone fractures. Hemostasis was unable to be achieved with TXA nebulizer, lidocaine with epi, and Afrin soaked cotton swab. Required Balloon Rhino Rocket 900 nasal packing and absorbable vicryl stitch to septal defect. He was discharged on Keflex 500mg  BID while nasal packing is in place. He will need outpatient ENT f/u within the [facility] system.   Acute blood loss anemia: Hemoglobin 12.7 on admission and stabilized to 9.2 at discharge with normal MCV. New dark stools, FOBT +, thought to be due to digested blood from epistaxis given elevated BUN. Dark stools resolved by time of discharge. No evidence of active bleeding.   Paroxysmal A. Fib: Xarelto was held for 48hrs and resumed on discharge. Tachycardic on presentation. His home diltiazem was given at half the dose (120mg ), can increase back to 240mg  when his HR can tolerate it. Home amiodarone 200mg  bid was continued.  Multiple Sclerosis: continued on home Teriflunomide once daily.      Discharge Vitals:   BP 106/67   Pulse (!) 110   Temp 97.7 F (36.5 C) (Oral)   Resp 18   Ht 6\' 3"  (1.905 m)   Wt 106.2 kg   SpO2 99%   BMI 29.26 kg/m   Pertinent Labs, Studies, and Procedures:  CBC Latest Ref Rng  & Units 03/21/2018 03/20/2018 03/20/2018  WBC 4.0 - 10.5 K/uL 12.6(H) 15.9(H) 12.0(H)  Hemoglobin 13.0 - 17.0 g/dL 5.7(Q) 4.6(N) 10.3(L)  Hematocrit 39.0 - 52.0 % 29.8(L) 30.0(L) 32.6(L)  Platelets 150 - 400 K/uL 200 219 191   Procedures .Epistaxis Management Date/Time: 03/19/2018 7:55 AM Performed by: Shon Baton, MD Authorized by: Shon Baton, MD   Consent:    Consent obtained:  Verbal   Consent given by:  Patient   Risks discussed:  Bleeding and nasal injury   Alternatives discussed:  No treatment Procedure details:    Treatment site:  L septum and L anterior   Treatment method:  Anterior pack and nasal balloon   Treatment complexity:  Extensive   Treatment episode: initial   Post-procedure details:    Assessment:  Bleeding stopped  Patient tolerance of procedure:  Tolerated well, no immediate complications Comments:     Initially brisk bleeding noted from the left naris associated with nasal bone fracture and exposure of septal cartilage.  TXA nebulizer, lidocaine with epi, and Afrin soaked cotton swab not effective.  Discussion with ENT to pack.  Patient continued to bleed after initial Rhino Rocket was placed.  Balloon Rhino Rocket 900 placed with some tamponade of bleeding.  However, a brisk bleed noted from the septal defect.  Vicryl stitch was placed with temporization of bleeding.  Marland Kitchen..Laceration Repair Date/Time: 03/19/2018 5:39 AM Performed by: Jeanie SewerFawze, Mina A, PA-C Authorized by: Jeanie SewerFawze, Mina A, PA-C   Consent:    Consent obtained:  Verbal   Consent given by:  Patient   Risks discussed:  Infection, need for additional repair, pain, poor cosmetic result and poor wound healing   Alternatives discussed:  No treatment and delayed treatment Universal protocol:    Procedure explained and questions answered to patient or proxy's satisfaction: yes     Relevant documents present and verified: yes     Test results available and properly labeled: yes      Imaging studies available: yes     Required blood products, implants, devices, and special equipment available: yes     Site/side marked: yes     Immediately prior to procedure, a time out was called: yes     Patient identity confirmed:  Verbally with patient and arm band Anesthesia (see MAR for exact dosages):    Anesthesia method:  Local infiltration   Local anesthetic:  Lidocaine 2% WITH epi Laceration details:    Location:  Face   Face location:  Forehead   Length (cm):  4   Depth (mm):  4 Repair type:    Repair type:  Simple Pre-procedure details:    Preparation:  Patient was prepped and draped in usual sterile fashion and imaging obtained to evaluate for foreign bodies Exploration:    Hemostasis achieved with:  Direct pressure   Wound exploration: wound explored through full range of motion and entire depth of wound probed and visualized     Wound extent: areolar tissue violated     Contaminated: yes   Treatment:    Area cleansed with:  Betadine   Amount of cleaning:  Extensive   Irrigation solution:  Sterile saline   Irrigation method:  Syringe   Visualized foreign bodies/material removed: no   Skin repair:    Repair method:  Sutures   Suture size:  5-0   Suture material:  Prolene   Suture technique:  Simple interrupted   Number of sutures:  8 Approximation:    Approximation:  Close Post-procedure details:    Dressing:  Open (no dressing)   Patient tolerance of procedure:  Tolerated well, no immediate complications  Discharge Instructions: Discharge Instructions    Diet - low sodium heart healthy   Complete by:  As directed    Discharge instructions   Complete by:  As directed    Mr. Martin Powell,  You were admitted to the hospital after you fell because you were too weak to walk and we wanted to monitor your blood levels. You have a small nose fracture and will need to follow up with the ENT doctors in 2 weeks to get that looked at. I have reached out to your PCP at  [facility] about this. She will place a consult order so you can get in with the E[facility] docs [facility].  Please call your PCP to schedule a hospital follow up appointment with her within the next 1-2 weeks to check your blood levels and manage your diltiazem dosing. Also make sure she is getting you an appointment with ENT.   Medication Changes: - decreased diltiazem to 120mg  daily  - started antibiotic, Keflex, 500mg  twice a day while nasal packing is in place   If you notice more bleeding, dark/tarry stools, worsening shortness of breath, or chest pain, please come back to the hospital.   Increase activity slowly   Complete by:  As directed       Signed: Ali Lowe, MD 03/21/2018, 1:24 PM   Pager: [phone]

## 2018-03-21 NOTE — Clinical Social Work Note (Signed)
CSW received call from Martin Powell [phone]) regarding patient. CSW advised that patient has been approved for 32 day contact at one of their [facility] contracted facilities. CSW emailed list to ensure most recent list given to patient.  2:19 pm - CSW visited with patient, provided update regarding approval for SNF placement and gave him a copy of SNF list so that he can make a decision regarding what facility is his preference. Mr. Martin Powell informed that a SW will follow--up with him to get his decision and that other facilities on the list will be contacted if the one he prefers is unable to take him. Unit CSW will follow-up with patient to attain his decision and make contact with facilities to find placement.  Genelle BalVanessa Lynee Rosenbach, MSW, LCSW Licensed Clinical Social Worker Clinical Social Work Department [facility] [phone]

## 2018-03-21 NOTE — Progress Notes (Signed)
   Subjective: NAEON. Family at bedside this morning. Denies pain. Worked with PT yesterday but was unable to walk. Had a hard enough time sitting on the edge of bed by himself. He is looking forward to going to rehab if it means he can walk again. Eating and drinking well.   Objective:  Vital signs in last 24 hours: Vitals:   03/20/18 1753 03/20/18 2308 03/21/18 0835 03/21/18 1155  BP: (!) 100/52 106/66 106/67   Pulse: 99 76 (!) 110   Resp: 18 18    Temp: 99.3 F (37.4 C) 97.6 F (36.4 C) 97.7 F (36.5 C)   TempSrc: Oral Oral Oral   SpO2: 95% 96% 99%   Weight:    106.2 kg  Height:    6\' 3"  (1.905 m)   Gen: laying in bed, sister and brother in law at bedside HENT: left eye hematoma with decreased swelling, oropharynx is clear without blood, EOMI, no pain with eye movement, sutures over left eyebrow  Pulm: CTAB Cardiac: HR 80, irregular  Ext: no LEE, no tenderness to palpation of the right foot   Assessment/Plan:  Active Problems:   Fall at home   Fall   Head injury   Laceration of forehead   Open fracture of nasal bones  [name] with PAF (on xarelto), MS, hypothyroidism who presented after a mechanical fall which resulted in left frontal scalp hematoma and minimally displaced fractures of the nasal bones. He was admitted for weakness and pain.   1. Epistaxis: hemostasis achieved with nasal packing - Keflex 500mg  BID while nasal packing is in place  - restart xarelto and baby aspirin today - Discharge with nasal packing in place, can be removed on 12/22  2. Acute blood loss anemia: hemoglobin stabilizing, 9.2 this morning (9.6 yesterday). Was previously having dark stools but per nursing, this has resolved and his stools are now turning green. No other signs of active bleeding.   3. Afib: HR 80 and irregular today. His home diltiazem dosing is 240mg  once daily but we have him on 120mg  daily. He is rate controlled.  - continue diltiazem 120mg  daily  4. Facial laceration: 8  absorbable sutures were placed above the left eyebrow   Dispo: I spoke with a nurse at his PCP's office within the [facility] system. In order to get an ENT f/u, Mr. Stetzer PCP needs to place a consult. Information about Mr. Deremer's hospital admission and reason for ENT f/u has been messaged to his PCP and I have given the PCP my contact info for any other questions.   Once we know which rehab facility Mr. Crofton is going to, I will call them and explain instructions concerning his nasal packing.    Anticipated discharge when SNF is available, possibly today.   Ali Lowe, MD 03/21/2018, 12:43 PM Pager: [phone]

## 2018-03-21 NOTE — Progress Notes (Signed)
  Date: 03/21/2018  Patient name: Martin Powell  Medical record number: [id number]  Date of birth: April 30, 1945   I have seen and evaluated this patient and I have discussed the plan of care with the house staff. Please see Dr. Consuello Bossier note for complete details. I concur with her findings with the following additions/corrections:   Discharge planning for today, hopefully this can be arranged.   Inez Catalina, MD 03/21/2018, 1:50 PM

## 2018-03-22 LAB — CBC
HCT: 27.9 % — ABNORMAL LOW (ref 39.0–52.0)
Hemoglobin: 8.7 g/dL — ABNORMAL LOW (ref 13.0–17.0)
MCH: 27.4 pg (ref 26.0–34.0)
MCHC: 31.2 g/dL (ref 30.0–36.0)
MCV: 87.7 fL (ref 80.0–100.0)
Platelets: 167 10*3/uL (ref 150–400)
RBC: 3.18 MIL/uL — AB (ref 4.22–5.81)
RDW: 18.7 % — ABNORMAL HIGH (ref 11.5–15.5)
WBC: 8.3 10*3/uL (ref 4.0–10.5)
nRBC: 0 % (ref 0.0–0.2)

## 2018-03-22 LAB — FERRITIN: Ferritin: 71 ng/mL (ref 24–336)

## 2018-03-22 NOTE — Progress Notes (Signed)
  Date: 03/22/2018  Patient name: Martin Powell  Medical record number: [id number]  Date of birth: 1945/05/13   This patient's plan of care was discussed with the house staff. Please see Dr. Consuello Bossier note for complete details. I concur with her findings.   Inez Catalina, MD 03/22/2018, 9:47 PM

## 2018-03-22 NOTE — Progress Notes (Addendum)
   Subjective: No complaints. No new issues. Waiting on SNF placement. Denies chest pain, dizziness, or sob.   Objective:  Vital signs in last 24 hours: Vitals:   03/21/18 1652 03/21/18 2319 03/22/18 0752 03/22/18 0753  BP: (!) 111/59 112/60 103/71   Pulse: 73 81 88 84  Resp:  20 16   Temp: 97.7 F (36.5 C) 98.2 F (36.8 C) 97.9 F (36.6 C)   TempSrc: Oral Oral Oral   SpO2: 99% 94%  100%  Weight:      Height:       Gen: laying in bed, NAD Cardiac: HR in the 80s, irregularly irregular. Loud murmur heard best at the left 2nd intercostal space  Pulm: CTAB HENT: EOMI intact without pain. Slightly TTP around eyes and nose. Left nasal packing in place, no active bleeding in the nose or the oropharynx  Ext: SCDs in place   Assessment/Plan:  Active Problems:   Fall at home   Fall   Head injury   Laceration of forehead   Open fracture of nasal bones  [name] with PAF (on xarelto), MS, hypothyroidism who presented after a mechanical fall which resulted in left frontal scalp hematoma and minimally displaced fractures of the nasal bones. He was admitted for weakness and pain.   1. Epistaxis: hemostasis achieved with nasal packing - Keflex 500mg  BID while nasal packing is in place  - continue home xarelto and baby aspirin - Discharge with nasal packing in place, can be removed on 12/22  2. Acute blood loss anemia: No signs of active bleeding. Vital signs stable. Denies symptomatic anemia such as dizziness, chest pain, sob.   - repeat CBC - check ferritin to see if he could benefit from po iron supplementation  3. Afib: rate controlled, remains in a. Fib  - continue diltiazem 120mg  daily (can increase if needed) His home dose is 240mg   4. Facial laceration: 8 absorbable sutures were placed above the left eyebrow  - healing appropriately   5. Heart murmur: Chronic. Corresponds to severe tricuspid regurgitation seen on echo   Dispo: Appreciate SW assistance. They will f/u with  family and patient regarding which facility they would like and then call about bed availability. Once facility is confirmed, I will call and discuss management of the Rhino Rocket nasal packing   Hopeful for discharge tomorrow.   Ali Lowe, MD 03/22/2018, 9:14 AM Pager: [phone]

## 2018-03-23 DIAGNOSIS — W19XXXD Unspecified fall, subsequent encounter: Secondary | ICD-10-CM

## 2018-03-23 DIAGNOSIS — S01112D Laceration without foreign body of left eyelid and periocular area, subsequent encounter: Secondary | ICD-10-CM

## 2018-03-23 DIAGNOSIS — S0003XD Contusion of scalp, subsequent encounter: Secondary | ICD-10-CM

## 2018-03-23 DIAGNOSIS — S022XXD Fracture of nasal bones, subsequent encounter for fracture with routine healing: Secondary | ICD-10-CM

## 2018-03-23 NOTE — Progress Notes (Signed)
CSW met with patient at bedside. Patient decided that he would like to go to [facility]. CSW will send all of his information over.   CSW will continue to follow up until patient has a bed.   Domenic Schwab, MSW, [place]

## 2018-03-23 NOTE — Progress Notes (Signed)
  Date: 03/23/2018  Patient name: Martin Powell  Medical record number: [id number]  Date of birth: Aug 02, 1945   I have seen and evaluated this patient and I have discussed the plan of care with the house staff. Please see Dr. Consuello Bossier note for complete details. I concur with her findings.   Inez Catalina, MD 03/23/2018, 1:39 PM

## 2018-03-23 NOTE — Progress Notes (Addendum)
   Subjective: Feeling better. Still struggling to get out of bed and walk. Hopeful rehab will be helpful. Eating and drinking well. Denies pain   Objective:  Vital signs in last 24 hours: Vitals:   03/22/18 0753 03/22/18 1650 03/22/18 2300 03/23/18 0811  BP:  120/68 101/81 118/62  Pulse: 84 78 82 99  Resp:  13 16 18   Temp:  98.2 F (36.8 C) 98.2 F (36.8 C) 98.1 F (36.7 C)  TempSrc:  Oral Oral Oral  SpO2: 100% 97% 98% 98%  Weight:      Height:       Gen: sitting up in bed, NAD HENT: facial bruising is improving. Rhino rocket in place with dried blood in the left nares and red mucous draining from right nares. Oropharynx is clear Pulm: CTAB Cardiac: RRR Abd: soft, NT  Assessment/Plan:  Active Problems:   Fall at home   Fall   Head injury   Laceration of forehead   Open fracture of nasal bones  [name] with PAF (on xarelto), MS, hypothyroidism who presented after a mechanical fall which resulted in left frontal scalp hematoma and minimally displaced fractures of the nasal bones. He was admitted for weakness and pain.   1. Epistaxis: hemostasis achieved with nasal packing. Rhino rocket placed 12/18 (can stay in for 5-7 days) - Keflex 500mg  BID while nasal packing is in place  - continue home xarelto and baby aspirin - Will try to remove rhino rocket today. Doubtful ENT will come in to do it. I will try to get ahold of them and ask if I can remove it myself.   ADDENDUM: ENT not reachable today. Spoke with ED physician. It is simple to remove. Just use a 10cc syringe to deflate the bulbs and then gently pull out the packing. However, there's no harm in leaving the packing in place. You want ENT around in case he re-bleeds after packing is removed. Will hold off on removing rhino rocket. Can reach out to ENT tomorrow and look into removing rhino rocket before patient discharges (possibly tomorrow, waiting on SNF).   2. Acute blood loss anemia: No signs of active bleeding. Vital  signs stable. Denies symptomatic anemia such as dizziness, chest pain, sob.   - hemoglobin slowly dropping but overall stable - ferritin WNLs so he does not need po iron   3. Afib: rate controlled, regular rhythm this morning   - continue diltiazem 120mg  daily (can increase if needed) His home dose is 240mg   4. Facial laceration: 8 absorbable sutures were placed above the left eyebrow  - healing appropriately    Dispo: Appreciate SW assistance. They will f/u with family and patient regarding which facility they would like and then call about bed availability. Once facility is confirmed, I will call and discuss management of the Rhino Rocket nasal packing   Hopeful for discharge today, if not today, then tomorrow.   Martin Lowe, MD 03/23/2018, 10:24 AM Pager: [phone]

## 2018-03-23 NOTE — Discharge Summary (Signed)
Name: Martin LouisJohn Powell MRN: [id number] DOB: Jul 25, 1945 72 y.o. [facility]  Date of Admission: 03/19/2018 12:42 AM Date of Discharge: 03/27/2018 Attending Physician: Anne Shutteraines, Alexander N, MD  Discharge Diagnosis: 1. Mechanical Fall 2. Nasal bone fractures 3. Epistaxis  4. Paroxysmal A. Fib 5. Multiple Sclerosis   Discharge Medications: Allergies as of 03/27/2018   No Known Allergies     Medication List    STOP taking these medications   diltiazem 120 MG 24 hr capsule Commonly known as:  TIAZAC   potassium chloride SA 20 MEQ tablet Commonly known as:  K-DUR,KLOR-CON   sotalol 120 MG tablet Commonly known as:  BETAPACE     TAKE these medications   acetaminophen 325 MG tablet Commonly known as:  TYLENOL Take 325 mg by mouth every 6 (six) hours as needed for mild pain or moderate pain.   amiodarone 200 MG tablet Commonly known as:  PACERONE Take 200 mg by mouth 2 (two) times daily.   aspirin EC 81 MG tablet Take 81 mg by mouth daily.   atorvastatin 80 MG tablet Commonly known as:  LIPITOR Take 80 mg by mouth daily.   AUBAGIO 14 MG Tabs Generic drug:  Teriflunomide Take 1 tablet by mouth daily.   diltiazem 240 MG 24 hr capsule Commonly known as:  DILACOR XR Take 240 mg by mouth daily.   furosemide 40 MG tablet Commonly known as:  LASIX Take 40 mg by mouth daily as needed (as needed for legs swelling).   gabapentin 300 MG capsule Commonly known as:  NEURONTIN Take 1 capsule (300 mg total) by mouth 3 (three) times daily.   levothyroxine 100 MCG tablet Commonly known as:  SYNTHROID, LEVOTHROID Take 100 mcg by mouth daily before breakfast.   lisinopril 20 MG tablet Commonly known as:  PRINIVIL,ZESTRIL Take 20 mg by mouth daily.   magnesium oxide 400 MG tablet Commonly known as:  MAG-OX Take 400 mg by mouth 2 (two) times daily.   neomycin-bacitracin-polymyxin Oint Commonly known as:  NEOSPORIN Apply 1 application topically 2 (two)  times daily.   pantoprazole 40 MG tablet Commonly known as:  PROTONIX Take 40 mg by mouth daily as needed (for reflux). To prevent acid reflux or bleeding (while on Xarelto).   rivaroxaban 20 MG Tabs tablet Commonly known as:  XARELTO Take 20 mg by mouth daily with supper.   sodium chloride 0.65 % Soln nasal spray Commonly known as:  OCEAN Place 4 sprays into both nostrils every hour as needed for congestion.   VITAMIN D3 SUPER STRENGTH 50 MCG (2000 UT) Tabs Generic drug:  Cholecalciferol Take 1 tablet by mouth.       Disposition and follow-up:   Martin Powell was [facility] St[facility].  At the hospital follow up visit please address:  1.  Nasal packing (Balloon Rhino Rocket 900) was removed on 03/24/18. Left forehead sutures are absorbable.  Wound care: - Half-strength hydrogen peroxide to his facial lacerations followed by bacitracin ointment twice daily. - Saline nasal spray 4 times a day and when necessary  Please schedule [facility] between 1/1-1/8 to follow up nasal bone fractures.   Xarelto and aspirin were held for 48hrs and resumed during his hospitalization.  Sotalol was discontinued, can be restarted as needed.  2.  Labs / imaging needed at time of follow-up: CBC to check hemoglobin (was 8.3 on discharge), TSH (was elevated during admission, no changes were made to levothyroxine dose)  3.  Pending labs/ test needing follow-up: none   Follow-up Appointments: Follow-up Information    Osborn Coho, MD Follow up.   Specialty:  Otolaryngology Why:  This is who saw you in the hospital. [facility], maybe you can be seen here for your fractured nose Contact information: [address] [place] [address] [phone]        [facility] Call in 2 week(s).   Why:  to manage diltiazem dosing and check your blood counts Contact information: [address] [facility] [place] [address] [phone]           Hospital Course by problem list: 72yo man with paroxysmal a. Fib, on xarelto, multiple sclerosis, hypothyroidism who presented to the ED after a mechanical fall resulting in facial laceration (forehead), left eye hematoma, minimally displaced nasal bone fractures, and significant epistaxis.  Mechanical Fall: his walker got caught on something while he was watering plants. Denies prodromal symptoms. On PT eval, he was too weak to ambulate so he was discharged to SNF.   Large, gaping 4cm facial laceration over the left eyebrow was repaired with 8 simple interrupted sutures using absorbable 5-0 prolene. Wound care with half-strength hydrogen peroxide to his facial lacerations followed by bacitracin ointment twice daily.  Epistaxis: Brisk bleeding from left naris with nasal bone fracture and exposure of septal cartilage. Xarelto was held for 48hrs. CT revealed minimally displaced nasal bone fractures. Hemostasis was unable to be achieved with TXA nebulizer, lidocaine with epi, and Afrin soaked cotton swab. Required Balloon Rhino Rocket 900 nasal packing and absorbable vicryl stitch to septal defect. The packing was removed on 12/23 without any complications or further bleeding. He should continue saline nasal spray PRN. He will need outpatient ENT f/u within the [facility] system.   Acute blood loss anemia: Hemoglobin 12.7 on admission and stabilized to 8.3 at discharge with normal MCV. New dark stools, FOBT +, thought to be due to digested blood from epistaxis given elevated BUN. Dark stools resolved by time of discharge. No evidence of active bleeding.   Paroxysmal A. Fib: Xarelto was held for 48hrs and resumed on discharge. Tachycardic on presentation. His home diltiazem was given at half the dose (120mg ) and resumed to his home dose of 240mg  daily on discharge. Home amiodarone 200mg  bid was continued. Home sotalol was discontinued, can resume  if he requires.  Multiple Sclerosis: continued on home Teriflunomide once daily.      Discharge Vitals:   BP 115/80 (BP Location: Right Arm)   Pulse (!) 111   Temp 99.3 F (37.4 C) (Oral)   Resp 16   Ht 6\' 3"  (1.905 m)   Wt 106.2 kg   SpO2 96%   BMI 29.26 kg/m   Pertinent Labs, Studies, and Procedures:  CBC Latest Ref Rng & Units 03/26/2018 03/22/2018 03/21/2018  WBC 4.0 - 10.5 K/uL 6.3 8.3 12.6(H)  Hemoglobin 13.0 - 17.0 g/dL 8.3(L) 8.7(L) 9.2(L)  Hematocrit 39.0 - 52.0 % 25.6(L) 27.9(L) 29.8(L)  Platelets 150 - 400 K/uL 168 167 200   Procedures .Epistaxis Management Date/Time: 03/19/2018 7:55 AM Performed by: Shon Baton, MD Authorized by: Shon Baton, MD   Consent:    Consent obtained:  Verbal   Consent given by:  Patient   Risks discussed:  Bleeding and nasal injury   Alternatives discussed:  No treatment Procedure details:    Treatment site:  L septum and L anterior  Treatment method:  Anterior pack and nasal balloon   Treatment complexity:  Extensive   Treatment episode: initial   Post-procedure details:    Assessment:  Bleeding stopped   Patient tolerance of procedure:  Tolerated well, no immediate complications Comments:     Initially brisk bleeding noted from the left naris associated with nasal bone fracture and exposure of septal cartilage.  TXA nebulizer, lidocaine with epi, and Afrin soaked cotton swab not effective.  Discussion with ENT to pack.  Patient continued to bleed after initial Rhino Rocket was placed.  Balloon Rhino Rocket 900 placed with some tamponade of bleeding.  However, a brisk bleed noted from the septal defect.  Vicryl stitch was placed with temporization of bleeding.  Marland Kitchen.Laceration Repair Date/Time: 03/19/2018 5:39 AM Performed by: Jeanie Sewer, PA-C Authorized by: Jeanie Sewer, PA-C   Consent:    Consent obtained:  Verbal   Consent given by:  Patient   Risks discussed:  Infection, need for additional repair,  pain, poor cosmetic result and poor wound healing   Alternatives discussed:  No treatment and delayed treatment Universal protocol:    Procedure explained and questions answered to patient or proxy's satisfaction: yes     Relevant documents present and verified: yes     Test results available and properly labeled: yes     Imaging studies available: yes     Required blood products, implants, devices, and special equipment available: yes     Site/side marked: yes     Immediately prior to procedure, a time out was called: yes     Patient identity confirmed:  Verbally with patient and arm band Anesthesia (see MAR for exact dosages):    Anesthesia method:  Local infiltration   Local anesthetic:  Lidocaine 2% WITH epi Laceration details:    Location:  Face   Face location:  Forehead   Length (cm):  4   Depth (mm):  4 Repair type:    Repair type:  Simple Pre-procedure details:    Preparation:  Patient was prepped and draped in usual sterile fashion and imaging obtained to evaluate for foreign bodies Exploration:    Hemostasis achieved with:  Direct pressure   Wound exploration: wound explored through full range of motion and entire depth of wound probed and visualized     Wound extent: areolar tissue violated     Contaminated: yes   Treatment:    Area cleansed with:  Betadine   Amount of cleaning:  Extensive   Irrigation solution:  Sterile saline   Irrigation method:  Syringe   Visualized foreign bodies/material removed: no   Skin repair:    Repair method:  Sutures   Suture size:  5-0   Suture material:  Prolene   Suture technique:  Simple interrupted   Number of sutures:  8 Approximation:    Approximation:  Close Post-procedure details:    Dressing:  Open (no dressing)   Patient tolerance of procedure:  Tolerated well, no immediate complications  Discharge Instructions: Discharge Instructions    Diet - low sodium heart healthy   Complete by:  As directed    Diet - low  sodium heart healthy   Complete by:  As directed    Discharge instructions   Complete by:  As directed    Martin Powell, Martin Powell were admitted to the hospital after you fell because you were too weak to walk and we wanted to monitor your blood levels. You have a small nose fracture  and will need to follow up with the ENT doctors in 2 weeks to get that looked at. I have reached out to your PCP at the [facility] about this. She will place a consult order so you can get in with the ENT docs at the [facility].  Please call your PCP to schedule a hospital follow up appointment with her within the next 1-2 weeks to check your blood levels and manage your diltiazem dosing. Also make sure she is getting you an appointment with ENT.   Medication Changes: - decreased diltiazem to 120mg  daily  - started antibiotic, Keflex, 500mg  twice a day while nasal packing is in place   If you notice more bleeding, dark/tarry stools, worsening shortness of breath, or chest pain, please come back to the hospital.   Discharge instructions   Complete by:  As directed    It was a pleasure taking care of you, Martin Powell!  1. You were hospitalized for facial fractures, nose bleed, and weakness. Your face is healing and your nose has stopped bleeding, so you are safe for discharge to a skilled nursing facility where you will continue to gain your strength.  2. Continue using the nasal saline spray to clear out the dried blood from your nose. You should also continue to use the bacitracin ointment to the wounds on your face as they heal.   3. You will need to follow-up with your PCP and an ENT (ear, nose, and throat) doctor.  4. We have made the following changes to your medications - We discontinued your sotalol  Feel free to call our clinic at [phone] if you have any questions.  Thanks, Dr. Avie Arenas   Increase activity slowly   Complete by:  As directed    Increase activity slowly   Complete by:  As directed        Signed: Dorrell, Cathleen Corti, MD 03/27/2018, 10:12 AM   Pager: [phone]

## 2018-03-24 MED ORDER — BACITRACIN-NEOMYCIN-POLYMYXIN OINTMENT TUBE
TOPICAL_OINTMENT | Freq: Two times a day (BID) | CUTANEOUS | Status: DC
  Administered 2018-03-24 – 2018-03-25 (×3): via TOPICAL
  Administered 2018-03-25: 1 via TOPICAL
  Administered 2018-03-26 – 2018-03-27 (×3): via TOPICAL
  Filled 2018-03-24: qty 14

## 2018-03-24 MED ORDER — SALINE SPRAY 0.65 % NA SOLN
4.0000 | NASAL | Status: DC | PRN
  Administered 2018-03-24: 4 via NASAL
  Filled 2018-03-24: qty 44

## 2018-03-24 NOTE — Consult Note (Signed)
ENT CONSULT:  Reason for Consult: Facial trauma and epistaxis Referring Physician: Internal medicine service  Martin Powell is an 72 y.o. male.  HPI: The patient presented to the emergency department on 03/19/2018 with acute facial trauma.  He slipped and fell with anterior facial trauma.  CT scan showed findings consistent with nasal septal fracture.  The patient had significant intranasal hemorrhage secondary to his injury and chronic anticoagulation therapy, patient taking Xarelto.  ER physicians managed his acute injuries with closure of lacerations and placement of left nasal packing.  Patient admitted to the hospital because of weakness and inability to ambulate, planned admission to nursing facility for rehabilitation.  ENT service consulted for removal of nasal packing and initial follow-up.  Patient reports having nasal congestion, sinus pressure and intermittent bloody postnasal discharge.  Past Medical History:  Diagnosis Date  . Cellulitis   . CHF (congestive heart failure) (HCC)   . Fall at home    "tirpped while working on [employer]" (03/19/2018)  . GERD (gastroesophageal reflux disease)   . Hypercholesteremia   . Hypertension   . Multiple sclerosis (HCC)   . Murmur, heart   . Pneumonia 2016  . Stroke [facility]) 2016 X 2   denies residual on 12/07/2015; 03/19/2018  . Thyroid activity decreased     Past Surgical History:  Procedure Laterality Date  . APPENDECTOMY    . ARTERIAL THROMBECTOMY  2016   "treated at [facility]; retrieved the clot in his brain"  . CARDIOVERSION     "I've had a couple" (03/19/2018)    Family History  Problem Relation Age of Onset  . CAD Neg Hx     Social History:  reports that he has never smoked. He has never used smokeless tobacco. He reports that he does not drink alcohol or use drugs.  Allergies: No Known Allergies  Medications: I have reviewed the patient's current medications.  Results for orders placed or performed during the hospital  encounter of 03/19/18 (from the past 48 hour(s))  CBC     Status: Abnormal   Collection Time: 03/22/18 12:36 PM  Result Value Ref Range   WBC 8.3 4.0 - 10.5 K/uL   RBC 3.18 (L) 4.22 - 5.81 MIL/uL   Hemoglobin 8.7 (L) 13.0 - 17.0 g/dL   HCT 40.9 (L) 81.1 - 91.4 %   MCV 87.7 80.0 - 100.0 fL   MCH 27.4 26.0 - 34.0 pg   MCHC 31.2 30.0 - 36.0 g/dL   RDW 78.2 (H) 95.6 - 21.3 %   Platelets 167 150 - 400 K/uL   nRBC 0.0 0.0 - 0.2 %    Comment: Performed at [facility], [address], [place], [place] [address]  Ferritin     Status: None   Collection Time: 03/22/18 12:36 PM  Result Value Ref Range   Ferritin 71 24 - 336 ng/mL    Comment: Performed at [facility], [address] [place], [place] [address]    No results found.  ROS:ROS 12 systems reviewed and negative except as stated in HPI   Blood pressure 118/78, pulse (!) 108, temperature 97.6 F (36.4 C), temperature source Oral, resp. rate 18, height 6\' 3"  (1.905 m), weight 106.2 kg, SpO2 96 %.  PHYSICAL EXAM: General appearance - alert, well appearing, and in no distress Nose -left nasal packing in place, removed without difficulty.  Patient has deviated septum but healing intranasal lacerations.  No active bleeding or evidence of infection Mouth - mucous membranes  moist, pharynx normal without lesions and Bloody postnasal discharge, no active bleeding Neck - supple, no significant adenopathy Face-the patient has crusting overlying his facial lacerations.  No evidence of bleeding, erythema or infection.  Removal of Nasal Packing: Left nasal packing is removed without difficulty, no active bleeding.  Patient has significant residual left nasal septal deviation and dry nasal mucosa.  Nasal laceration appears to be healing.  Studies Reviewed: Maxillofacial CT scan  Assessment/Plan: The patient was admitted to the medical service for management after suffering a fall with significant anterior facial trauma and  epistaxis.  Packing is removed from the left nasal passageway without difficulty, no active bleeding.  The patient has residual deviated nasal septum, intranasal lacerations appear to be healing.  No evidence of infection or other concern.  Recommend nasal precautions with frequent nasal saline spray, avoiding nasal trauma and no nose blowing.  The patient has facial lacerations with sutures intact, would recommend suture removal in approximately 1 week at his rehab facility with the patient may return to our [facility] [phone] for reevaluation.  Wound care precautions for his facial lacerations include half-strength hydrogen peroxide and bacitracin ointment on a twice daily basis.  Monitor for additional concerns and follow-up as needed.  Fracture precautions: 1. Elevate head of bed 2.  Half-strength hydrogen peroxide to his facial lacerations followed by bacitracin ointment twice daily. 3. Avoid additional trauma, nose blowing or sneezing 4. Liquid and soft diet as tolerated 5. Saline nasal spray 4 times a day and when necessary  Osborn CohoDavid Flynn Gwyn 03/24/2018, 12:33 PM

## 2018-03-24 NOTE — Progress Notes (Signed)
   Subjective: No overnight events. Martin Powell reports that he feels well today. He doesn't have any facial pain. He has not noticed any bleeding from the front or back of his nose. He has no concerns today.  Objective:  Vital signs in last 24 hours: Vitals:   03/22/18 2300 03/23/18 0811 03/23/18 1758 03/23/18 2329  BP: 101/81 118/62 113/62 104/68  Pulse: 82 99 100 92  Resp: 16 18 20 18   Temp: 98.2 F (36.8 C) 98.1 F (36.7 C) 98.4 F (36.9 C) 98.9 F (37.2 C)  TempSrc: Oral Oral Oral Oral  SpO2: 98% 98% 99% 97%  Weight:      Height:       Physical Exam Constitutional: Well-developed, well-nourished, and in no distress.  HENT: Deep purple ecchymoses surrounding the left eye. Rhino rocket is in place with dried blood in the nares. Cardiovascular: Normal rate. Irregular rhythm. No murmurs, rubs, or gallops. Pulmonary/Chest: Effort normal. Clear to auscultation bilaterally. No wheezes, rales, or rhonchi. Abdominal: Bowel sounds present. Soft, non-distended, non-tender. Ext: No lower extremity edema. Skin: Warm and dry.  Assessment/Plan:  Active Problems:   [facility]   [facility]   Laceration of forehead   Open fracture of nasal bones  [name] with PAF (on xarelto), MS, hypothyroidism who presented after a mechanical fall which resulted in left frontal scalp hematoma and minimally displaced fractures of the nasal bones. He was admitted for weakness and pain.   Epistaxis - Hemostasis achieved with nasal packing. Rhino rocket placed 12/18. Plan - Keflex 500mg  BID while nasal packing is in place  - Continue home xarelto and baby aspirin - Consulted ENT to remove the rhino rocket today.  Acute blood loss anemia - No signs of active bleeding. Vital signs stable. Denies symptomatic anemia such as dizziness, chest pain, sob.   - Ferritin is wnl, so he does not need PO iron Plan - Monitor for symptomatic anemia   Afib - Irregular rhythm today. Rate controlled  with diltiazem. - Currently on half of his home dose of diltiazem. Can increase back to 240mg  daily if he becomes tachycardic. Plan - Continue diltiazem 120mg  daily  Dispo: Anticipated discharge in approximately today or tomorrow pending rhino rocket removal and SNF placement.  Magalene Mclear, Cathleen Cortieborah N, MD 03/24/2018, 6:13 AM Pager: [phone]

## 2018-03-24 NOTE — Progress Notes (Signed)
  Date: 03/24/2018  Patient name: Martin Powell  Medical record number: [id number]  Date of birth: July 13, 1945   I have seen and evaluated this patient and I have discussed the plan of care with the house staff. Please see their note for complete details. I concur with their findings with the following additions/corrections:   Nasal packing removed today just before my exam, had some mild oozing from his nose easily managed with a tissue.  Hopeful for discharge to SNF soon.  Jessy OtoAlexander Raines, M.D., Ph.D. 03/24/2018, 4:23 PM

## 2018-03-24 NOTE — Clinical Social Work Note (Signed)
[facility] does not have any bed availability. CSW notified patient. Sister and brother-in-law at bedside. Reviewed other [facility]-contracted facilities. First preference is [facility] in [place], second preference is [facility] [place]. [name] may have a [facility] bed opening up in the next day or so. Will fax referral to admissions coordinator, Wilkie Aye. Unable to reach anyone at [facility] [place].  Charlynn Court, CSW [phone]

## 2018-03-25 DIAGNOSIS — S0181XD Laceration without foreign body of other part of head, subsequent encounter: Secondary | ICD-10-CM

## 2018-03-25 NOTE — Progress Notes (Signed)
   Subjective: No overnight events. Patient reports that he feels much better since the nasal packing was removed. He denies any bleeding. He understands that we are waiting for SNF placement at this point. He has no new symptoms or other concerns at this time.  Objective:  Vital signs in last 24 hours: Vitals:   03/23/18 2329 03/24/18 0808 03/24/18 1649 03/24/18 2326  BP: 104/68 118/78 107/69 116/67  Pulse: 92 (!) 108 90 (!) 108  Resp: 18   18  Temp: 98.9 F (37.2 C) 97.6 F (36.4 C) 97.6 F (36.4 C) 98.2 F (36.8 C)  TempSrc: Oral Oral Oral Oral  SpO2: 97% 96% 97% 99%  Weight:      Height:       Physical Exam Constitutional: Well-developed, well-nourished, and in no distress. HENT: Deep purple ecchymoses surrounding the left eye. Bilateral nares with crusted blood. No signs of active bleeding. No sign of bleeding in the oropharynx.  Cardiovascular:Normal rate. Irregular rhythm. No murmurs, rubs, or gallops. Pulmonary/Chest:Effort normal. Clear to auscultation bilaterally. No wheezes, rales, or rhonchi. Abdominal: Bowel sounds present. Soft, non-distended, non-tender. Ext: No lower extremity edema. Skin: Warm and dry.  Assessment/Plan:  Principal Problem:   Fall at home Active Problems:   Fall   Head injury   Laceration of forehead   Open fracture of nasal bones  [name] with PAF (on xarelto), MS, hypothyroidism who presented after a mechanical fall which resulted in left frontal scalp hematoma and minimally displaced fractures of the nasal bones. He was admitted for weakness and pain.   Epistaxis - Hemostasis achieved with nasal packing. Rhino rocket placed 12/18 and subsequently removed 12/24 without complications. Patient was on Keflex 500mg  BID while nasal packing was in place.  - Xarelto and aspirin were initially held, but resumed once hemostasis was achieved. Plan - Discontinue Keflex - Continue home xarelto and baby aspirin -Elevate head of the bed - BID  wound care consisting of: half-strength hydrogen peroxide to facial lacerations followed by bacitracin ointment - Saline nasal spray QID and PRN  Acute blood loss anemia - No signs of active bleeding. Vital signs stable. Denies symptomatic anemia such as dizziness, chest pain, sob.  - Ferritin is wnl, so he does not need PO iron Plan - Monitor for symptomatic anemia   Afib - Irregular rhythm today. Rate controlled with diltiazem. - Currently on half of his home dose of diltiazem. Can increase back to 240mg  daily if he becomes tachycardic. Plan - Continue diltiazem 120mg  daily  Dispo: Anticipated discharge in approximately in 2 days pending SNF placement.  Espiridion Supinski, Cathleen Corti, MD 03/25/2018, 6:17 AM Pager: [phone]

## 2018-03-25 NOTE — Plan of Care (Signed)
Problem: Education: Goal: Knowledge of General Education information will improve Description Including pain rating scale, medication(s)/side effects and non-pharmacologic comfort measures Outcome: Progressing   Problem: Nutrition: Goal: Adequate nutrition will be maintained Outcome: Progressing   Problem: Coping: Goal: Level of anxiety will decrease Outcome: Progressing   Problem: Skin Integrity: Goal: Risk for impaired skin integrity will decrease Outcome: Progressing   

## 2018-03-25 NOTE — Progress Notes (Signed)
Physical Therapy Treatment Patient Details Name: Martin Powell MRN: [id number] DOB: [birth year] Today's Date: 03/25/2018    History of Present Illness 72 y.o. male admitted with nasal fx, scalp hematoma after mechanical fall at home 12/18. PMH includes: MS, A fib, MI 2019, CVA 2016, HTN, HLD, CHF. Head CT revealed no intracranial abnormality. Dx with epistaxis, frontal scalp hematoma, minimally displaced fractures of the nasal bones, ABLA, and A-fib.    PT Comments    Pt was able to stand today in standing frame with significant help (two person) and transfer OOB to the recliner chair.  He will likely need to the maxi move lift to get back into bed (RN made aware).  Pt continues to be significantly deconditioned and far from his baseline level of functioning (household ambulator with RW).  He remains appropriate for SNF level rehab at discharge.  PT will continue to follow acutely for safe mobility progression  Follow Up Recommendations  SNF     Equipment Recommendations  Hospital bed;Wheelchair (measurements PT);Wheelchair cushion (measurements PT)    Recommendations for Other Services   NA     Precautions / Restrictions Precautions Precautions: Fall Precaution Comments: Watch HR- afib, h/o fall    Mobility  Bed Mobility Overal bed mobility: Needs Assistance Bed Mobility: Supine to Sit     Supine to sit: +2 for physical assistance;Mod assist;HOB elevated     General bed mobility comments: Two person physical assist to support trunk to come to sitting EOB.  Support needed to help progress bil legs over and more significant support at trunk due to tendancy towards posterior and right lateral lean.   Transfers Overall transfer level: Needs assistance   Transfers: Sit to/from Stand Sit to Stand: +2 physical assistance;From elevated surface;Mod assist         General transfer comment: Heavy mod assist to stand and to get pt to extend trunk and hips from elevated bed to sit on  regluar steady and transfer OOB.  I don't think this will be successful later, so lift pad positioned under pad and under patient in chair to help with ease of return to bed.   Ambulation/Gait             General Gait Details: unable at this time, but pt reports he was ablulatory houshold distances with RW PTA.           Balance Overall balance assessment: Needs assistance Sitting-balance support: Feet supported;Bilateral upper extremity supported Sitting balance-Leahy Scale: Poor Sitting balance - Comments: Mod assist EOB due to posterior and right lateral lean preference.  Postural control: Right lateral lean;Posterior lean Standing balance support: Bilateral upper extremity supported Standing balance-Leahy Scale: Poor Standing balance comment: two person heavy mod assist in steady standing frame                            Cognition Arousal/Alertness: Awake/alert Behavior During Therapy: WFL for tasks assessed/performed Overall Cognitive Status: History of cognitive impairments - at baseline                                 General Comments: Not specifically tested.              Pertinent Vitals/Pain Pain Assessment: Faces Faces Pain Scale: Hurts little more Pain Location: nose, LEs (sore) Pain Descriptors / Indicators: Sore Pain Intervention(s): Limited activity within patient's tolerance;Monitored during session;Repositioned  PT Goals (current goals can now be found in the care plan section) Acute Rehab PT Goals Patient Stated Goal: Pt was agreeable to OOB to chair, he wants to be able to walk again.  Progress towards PT goals: Progressing toward goals    Frequency    Min 2X/week      PT Plan Current plan remains appropriate       AM-PAC PT "6 Clicks" Mobility   Outcome Measure  Help needed turning from your back to your side while in a flat bed without using bedrails?: A Lot Help needed moving from lying on  your back to sitting on the side of a flat bed without using bedrails?: A Lot Help needed moving to and from a bed to a chair (including a wheelchair)?: A Lot Help needed standing up from a chair using your arms (e.g., wheelchair or bedside chair)?: A Lot Help needed to walk in hospital room?: Total Help needed climbing 3-5 steps with a railing? : Total 6 Click Score: 10    End of Session Equipment Utilized During Treatment: Gait belt Activity Tolerance: Patient limited by fatigue Patient left: in chair;with call bell/phone within reach;with chair alarm set Nurse Communication: Mobility status;Need for lift equipment PT Visit Diagnosis: Muscle weakness (generalized) (M62.81);Other symptoms and signs involving the nervous system (R29.898);Difficulty in walking, not elsewhere classified (R26.2)     Time: 4098-1191 PT Time Calculation (min) (ACUTE ONLY): 23 min  Charges:  $Therapeutic Activity: 23-37 mins                    Zailee Vallely B. Channon Ambrosini, PT, DPT  Acute Rehabilitation [phone] pager #[phone] office    03/25/2018, 5:06 PM

## 2018-03-25 NOTE — Clinical Social Work Note (Addendum)
Patient has a bed at [facility] once authorization through the [place] confirmed with the facility. CSW left a Engineer, technical sales for [employer] social worker that provided authorization. Also emailed another Child psychotherapist that this CSW has worked with in the past. SNF admissions coordinator gave CSW phone number for an Production designer, theatre/television/film at the [place]. He was unable to provide the SNF with the information and stated he was not sure if a Child psychotherapist was working today or not. He stated a weekend social worker may come in but they do not arrive until 9:00 if so. Will try calling then.  Charlynn Court, CSW [phone]  10:23 am Social workers at [employer] are not working today. No response from [facility] social workers either. Plan for discharge to [facility] on Thursday.  Charlynn Court, CSW [phone]  10:54 am Provided update to patient and made him aware that he will definitely need to bring his MS medication with him from home. Patient stated he will need PTAR.  Charlynn Court, CSW [phone]

## 2018-03-25 NOTE — Progress Notes (Signed)
  Date: 03/25/2018  Patient name: Martin Powell  Medical record number: [id number]  Date of birth: [birth year]   I have seen and evaluated this patient and I have discussed the plan of care with the house staff. Please see their note for complete details. I concur with their findings with the following additions/corrections:   Nasal packing removed yesterday, minor bleeding afterward, now resolved, doing well. Breathing easily despite nasal septum deviation from fracture. Medically ready for discharge to SNF when bed available.   Jessy Oto, M.D., Ph.D. 03/25/2018, 3:27 PM

## 2018-03-25 NOTE — Discharge Instructions (Signed)

## 2018-03-26 LAB — CBC
HCT: 25.6 % — ABNORMAL LOW (ref 39.0–52.0)
Hemoglobin: 8.3 g/dL — ABNORMAL LOW (ref 13.0–17.0)
MCH: 27.9 pg (ref 26.0–34.0)
MCHC: 32.4 g/dL (ref 30.0–36.0)
MCV: 86.2 fL (ref 80.0–100.0)
Platelets: 168 10*3/uL (ref 150–400)
RBC: 2.97 MIL/uL — ABNORMAL LOW (ref 4.22–5.81)
RDW: 18.7 % — ABNORMAL HIGH (ref 11.5–15.5)
WBC: 6.3 10*3/uL (ref 4.0–10.5)
nRBC: 0 % (ref 0.0–0.2)

## 2018-03-26 NOTE — Progress Notes (Signed)
  Date: 03/26/2018  Patient name: Martin Powell  Medical record number: [id number]  Date of birth: April 29, 1945   I have seen and evaluated this patient and I have discussed the plan of care with the house staff. Please see their note for complete details. I concur with their findings with the following additions/corrections:  Doing well, no significant change in the plan.  Medically ready for discharge when a bed is available.  Jessy Oto, M.D., Ph.D. 03/26/2018, 2:36 PM

## 2018-03-26 NOTE — Progress Notes (Signed)
   Subjective: No overnight events. Martin Powell continues to feel well. He denies nose bleeding and pain. He has been using his nasal saline spray which has helped clear his nose of dried blood. He denies dyspnea   Objective:  Vital signs in last 24 hours: Vitals:   03/24/18 2326 03/25/18 0823 03/25/18 1624 03/25/18 2305  BP: 116/67 113/67 113/71 135/79  Pulse: (!) 108 93 (!) 116 100  Resp: 18 20 20 20   Temp: 98.2 F (36.8 C) 97.6 F (36.4 C) 98.8 F (37.1 C) 98.4 F (36.9 C)  TempSrc: Oral Oral Oral Oral  SpO2: 99% 99% 97% 97%  Weight:      Height:       Physical Exam Constitutional: Well-developed, well-nourished, and in no distress. HENT: Deep purple ecchymoses surrounding the left eye. Bilateral nares with crusted blood. No signs of active bleeding. No sign of bleeding in the oropharynx.  Cardiovascular:Normal rate. Irregular rhythm. No murmurs, rubs, or gallops. Pulmonary/Chest:Effort normal. Clear to auscultation bilaterally. No wheezes, rales, or rhonchi. Abdominal: Bowel sounds present. Soft, non-distended, non-tender. Ext: No lower extremity edema. Skin: Warm and dry.  Assessment/Plan:  Principal Problem:   Fall at home Active Problems:   Fall   Head injury   Laceration of forehead   Open fracture of nasal bones  [name] with PAF (on xarelto), MS, hypothyroidism who presented after a mechanical fall which resulted in left frontal scalp hematoma and minimally displaced fractures of the nasal bones. He was admitted for weakness and pain.   Epistaxis - Hemostasis achieved with nasal packing. Rhino rocket placed 12/18 and subsequently removed 12/24 without complications. Patient was on Keflex 500mg  BID while nasal packing was in place.  - Xarelto and aspirin were initially held, but resumed once hemostasis was achieved. Plan -Continue home xarelto and baby aspirin -Elevate head of the bed - BID wound care consisting of: half-strength hydrogen peroxide to  facial lacerations followed by bacitracin ointment - Saline nasal spray QID and PRN  Acute blood loss anemia - Hb 8.3 today (down from 8.7 four days ago) -No signs of active bleeding. Vital signs stable. Denies symptomatic anemia such as dizziness, chest pain, sob. - Ferritin is wnl, so he does not need PO iron Plan - Monitor for symptomatic anemia  Afib - Irregular rhythm today. Rate controlled with diltiazem and amiodarone. - Currently on half of his home dose of diltiazem. Can increase back to 240mg  daily if he becomes tachycardic. Plan -Continue diltiazem 120mg  daily and amiodarone 200mg  daily  Dispo: Anticipated discharge tomorrow.  Martin Powell, Martin Corti, MD 03/26/2018, 6:42 AM Pager: [phone]

## 2018-03-27 DIAGNOSIS — W01198D Fall on same level from slipping, tripping and stumbling with subsequent striking against other object, subsequent encounter: Secondary | ICD-10-CM

## 2018-03-27 MED ORDER — BACITRACIN-NEOMYCIN-POLYMYXIN OINTMENT TUBE: 1.0000 "application " | TOPICAL_OINTMENT | Freq: Two times a day (BID) | CUTANEOUS | 0 refills | Status: AC

## 2018-03-27 MED ORDER — DILTIAZEM HCL ER COATED BEADS 240 MG PO CP24: 240.0000 mg | ORAL_CAPSULE | Freq: Every day | ORAL | Status: DC

## 2018-03-27 MED ORDER — SALINE SPRAY 0.65 % NA SOLN: 4.0000 | NASAL | 0 refills | Status: AC | PRN

## 2018-03-27 NOTE — Care Management Important Message (Signed)
Important Message  Patient Details  Name: Martin Powell MRN: [id number] Date of Birth: 25-Jun-1945   Medicare Important Message Given:  Yes    Jermond Burkemper P Janda Cargo 03/27/2018, 1:29 PM

## 2018-03-27 NOTE — Progress Notes (Signed)
Pt [facility] [place] per physician order.Pt received all  personal belongings and Remaining Medication ( teriflusomide) from Pharmacy. Report given to Cardinal HealthMelissa Moppin, RN.Education has been provided to pt. Pt left hospital with P[facility] via strether.

## 2018-03-27 NOTE — Clinical Social Work Note (Signed)
Clinical Social Worker facilitated patient discharge including contacting patient family and facility to confirm patient discharge plans.  Clinical information faxed to facility and family agreeable with plan.  CSW arranged ambulance [facility] via [facility]  RN to call [phone] for report prior to discharge.  Clinical Social Worker will sign off for now as social work intervention is no longer needed. Please consult us again if new need arises.  [place]ogue[place]ridget Frederick Klinger, [place] [phone]

## 2018-03-27 NOTE — Clinical Social Work Placement (Signed)
   CLINICAL SOCIAL WORK PLACEMENT  NOTE  Date:  03/27/2018  Patient Details  Name: Martin Powell MRN: [id number] Date of Birth: July 27, 1945  Clinical Social Work is seeking post-discharge placement for this patient at the Skilled  Nursing Facility level of care (*CSW will initial, date and re-position this form in  chart as items are completed):      Patient/family provided with [facility] Clinical Social Work Department's list of facilities offering this level of care within the geographic area requested by the patient (or if unable, by the patient's family).  Yes   Patient/family informed of their freedom to choose among providers that offer the needed level of care, that participate in Medicare, Medicaid or managed care program needed by the patient, have an available bed and are willing to accept the patient.      Patient/family informed of [facility]'s ownership interest in [facility] and [facility], as well as of the fact that they are under no obligation to receive care at these facilities.  PASRR submitted to EDS on       PASRR number received on 03/21/18     Existing PASRR number confirmed on       FL2 transmitted to all facilities in geographic area requested by pt/family on 03/21/18     FL2 transmitted to all facilities within larger geographic area on       Patient informed that his/her managed care company has contracts with or will negotiate with certain facilities, including the following:        Yes   Patient/family informed of bed offers received.  Patient chooses bed at [name]     Physician recommends and patient chooses bed at      Patient to be transferred to [name] on 03/27/18.  Patient to be transferred to facility by [facility]     Patient family notified on 03/27/18 of transfer.  Name of family member notified:  Annice PihJackie     PHYSICIAN       Additional Comment:    _______________________________________________ Maree KrabbeBridget A Dalis Beers,  LCSW 03/27/2018, 10:13 AM

## 2018-03-27 NOTE — Progress Notes (Signed)
   Subjective: No overnight events. Mr. Knopp feels well today and is looking forward to discharge to SNF so he can continue working on getting his strength back. He has no new symptoms or concerns at this time.  Objective:  Vital signs in last 24 hours: Vitals:   03/25/18 2305 03/26/18 0827 03/26/18 1718 03/26/18 2331  BP: 135/79 105/63 134/87 124/74  Pulse: 100 (!) 121 91 (!) 112  Resp: 20 (!) 22 20 18   Temp: 98.4 F (36.9 C) 98.2 F (36.8 C) 98.4 F (36.9 C) 98.4 F (36.9 C)  TempSrc: Oral Oral Oral Oral  SpO2: 97% 98% 98% 97%  Weight:      Height:       Constitutional: Well-developed, well-nourished, and in no distress. HENT: Deep purple ecchymoses surrounding the left eye. Bilateral nares with crusted blood. No signs of active bleeding. No sign of bleeding in the oropharynx. Cardiovascular:Irregularly irregular. Pulmonary/Chest:Effort normal. Clear to auscultation bilaterally. No wheezes, rales, or rhonchi. Abdominal: Bowel sounds present. Soft, non-distended, non-tender. Ext: No lower extremity edema. Skin: Warm and dry.  Assessment/Plan:  Principal Problem:   Fall at home Active Problems:   Fall   Head injury   Laceration of forehead   Open fracture of nasal bones  [name] with PAF (on xarelto), MS, hypothyroidism who presented after a mechanical fall which resulted in left frontal scalp hematoma and minimally displaced fractures of the nasal bones. He was admitted for weakness and pain.   Epistaxis - Hemostasis achieved with nasal packing. Rhino rocket placed 12/18and subsequently removed 12/24 without complications. Patient was on Keflex 500mg  BID while nasal packing was in place.  - Xarelto and aspirin were initially held, but resumed once hemostasis was achieved. Plan -Continue home xarelto and baby aspirin -Elevate head of the bed - BID wound care consisting of: half-strength hydrogen peroxide to facial lacerations followed by bacitracin ointment -  Saline nasal spray QID and PRN  Acute blood loss anemia - Hb 8.3 12/25 -No signs of active bleeding. Vital signs stable. Denies symptomatic anemia such as dizziness, chest pain, sob. - Ferritin is wnl, so he does not need PO iron Plan - Monitor for symptomatic anemia  Afib - Irregular rhythm today. Rate controlled with diltiazem and amiodarone. - Tachycardic over the past 48 hours ranging from 100-120bpm. Will increase his diltiazem dose back to his home dose of 240mg  daily.  Plan - Increase diltiazem from 120mg  to 24mg  daily - Continue amiodarone 200mg  daily  Dispo: Anticipated discharge approximately today pending SNF placement.  Josilyn Shippee, Cathleen Corti, MD 03/27/2018, 6:22 AM Pager: [phone]

## 2019-05-04 DEATH — deceased

## 2020-04-11 IMAGING — CT CT CERVICAL SPINE W/O CM
5 of 10 series · 9 of 33 positions shown, 10 images · non-contrast
Comparison: None.

CLINICAL DATA: Fall with facial laceration

EXAM:
CT HEAD WITHOUT CONTRAST
CT MAXILLOFACIAL WITHOUT CONTRAST
CT CERVICAL SPINE WITHOUT CONTRAST
TECHNIQUE: Multidetector CT imaging of the head, cervical spine, and
maxillofacial structures were performed using the standard protocol
without intravenous contrast. Multiplanar CT image reconstructions
of the cervical spine and maxillofacial structures were also
generated.

[Series 7: facial/ orbits 2.0 h30s · axial · 0.37mm/px · z∈[+1064,+1124]mm · 2 of 92 slices shown, 3 images]
[im 31/92  soft-tissue]
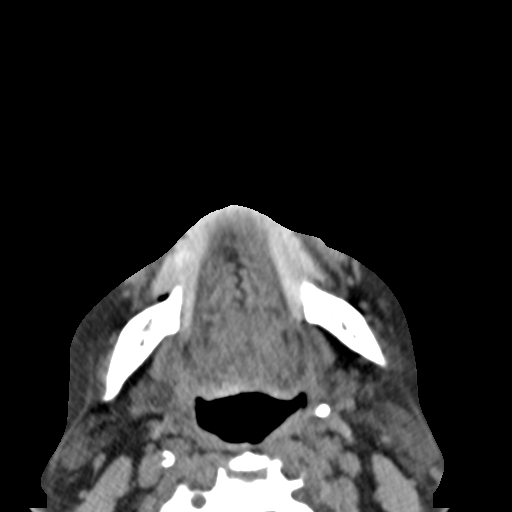
[im 31/92  bone]
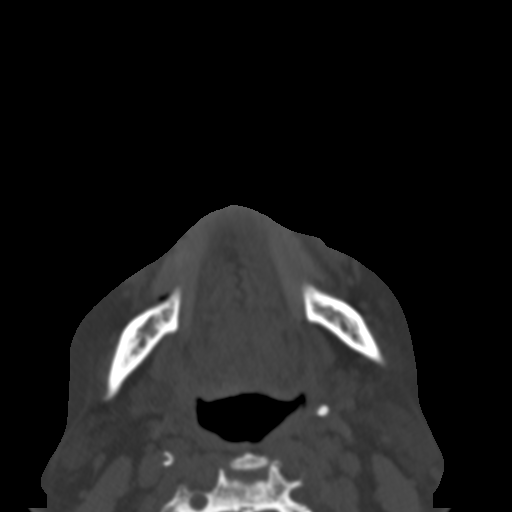
[im 61/92  bone]
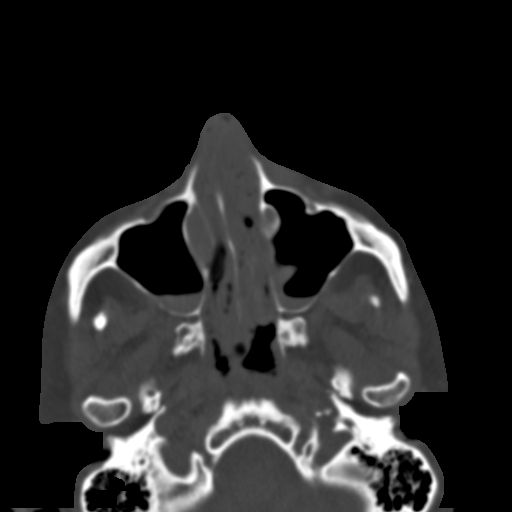

[Series 11: coronal soft tissue · coronal · 0.36mm/px · 1 of 74 slices shown]
[im 37/74  bone]
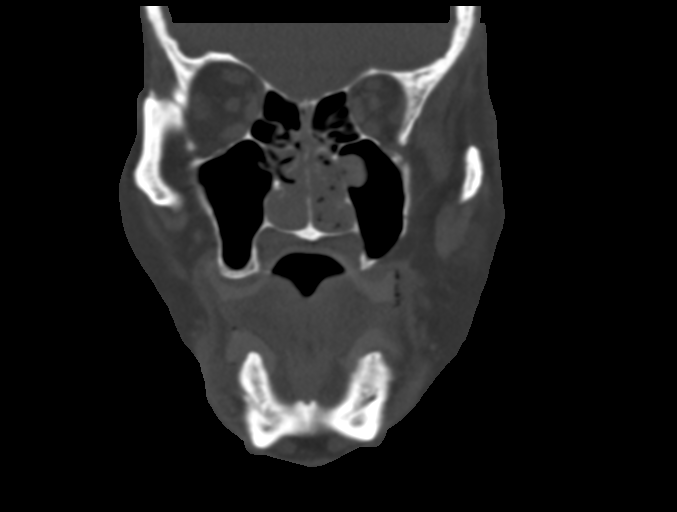

[Series 16: c_spine 2.0 3 st · axial · 0.45mm/px · z∈[+1028,+1080]mm · 2 of 79 slices shown]
[im 27/79  bone]
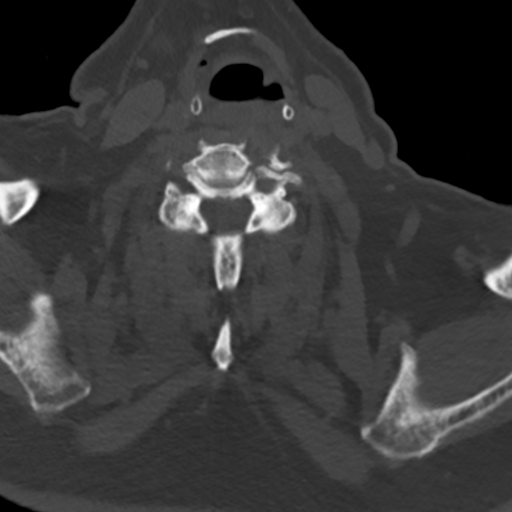
[im 53/79  bone]
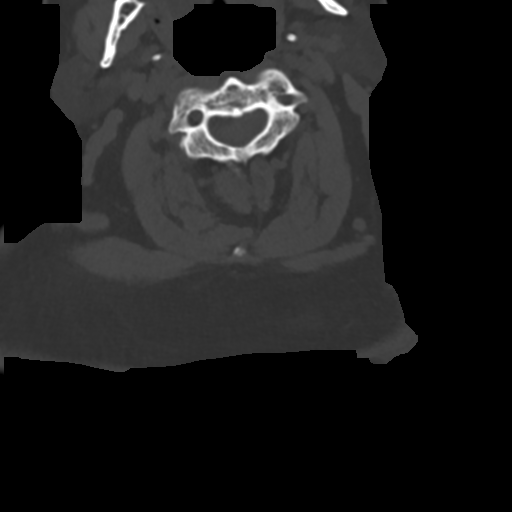

[Series 20: sagittal bone · sagittal · 0.31mm/px · 2 of 61 slices shown]
[im 21/61  bone]
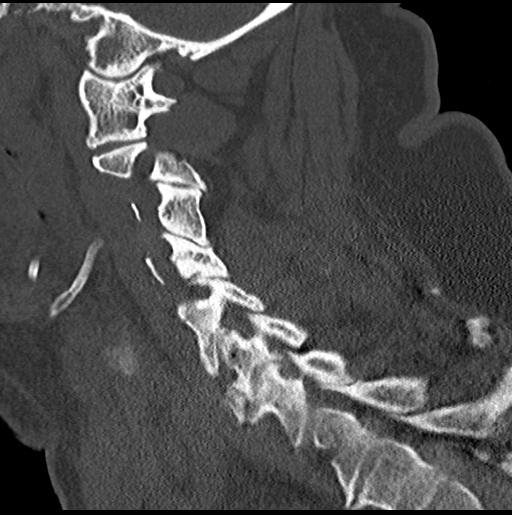
[im 41/61  bone]
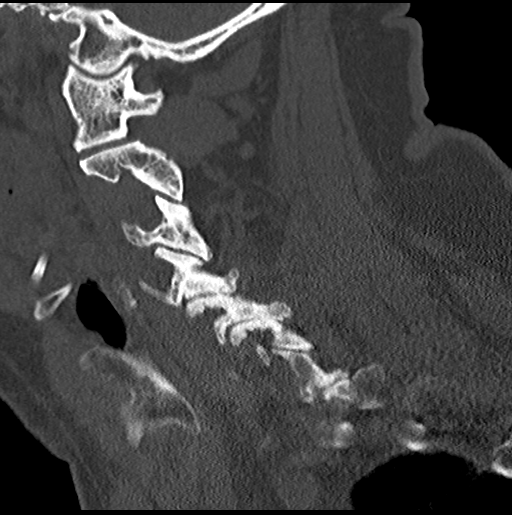

[Series 23: orthogonal axials st · oblique · 0.21mm/px · 2 of 90 slices shown]
[im 30/90  bone]
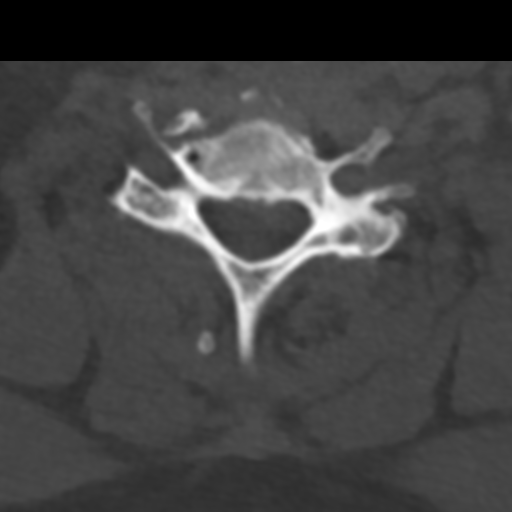
[im 60/90  bone]
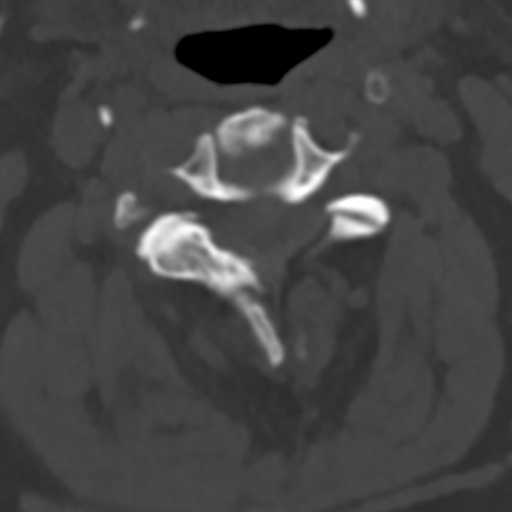

[9 of 33 positions shown; findings below may reference images not displayed]

FINDINGS: CT HEAD FINDINGS

Brain: There is no mass, hemorrhage or extra-axial collection. There
is generalized atrophy without lobar predilection. There is
hypoattenuation of the periventricular white matter, most commonly
indicating chronic ischemic microangiopathy.

Vascular: Atherosclerotic calcification of the internal carotid
arteries at the skull base. No abnormal hyperdensity of the major
intracranial arteries or dural venous sinuses.

Skull: Large left frontal scalp hematoma.  No skull fracture.

CT MAXILLOFACIAL FINDINGS

Osseous:

--Complex facial fracture types: No [place], zygomaticomaxillary
complex or nasoorbitoethmoidal fracture.

--Simple fracture types: Minimally displaced fractures of the nasal
bones.

--Mandible: No fracture or dislocation.

Orbits: The globes are intact. Normal appearance of the intra- and
extraconal fat. Symmetric extraocular muscles and optic nerves.

Sinuses: The nasal cavity is filled with blood. There is a small
amount of blood within both maxillary sinuses.

Soft tissues: Normal visualized extracranial soft tissues.

CT CERVICAL SPINE FINDINGS

Alignment: Grade 1 retrolisthesis at C3-4. Facets are aligned.
Lateral masses of C1 and C2 are aligned with the occipital condyles.

Skull base and vertebrae: No acute fracture.

Soft tissues and spinal canal: No prevertebral fluid or swelling. No
visible canal hematoma.

Disc levels: Mild spinal canal stenosis at C3-4 due to disc
osteophyte complex. There is multilevel neural foraminal stenosis,
worst at C3-4 and C6-7.

Upper chest: No pneumothorax, pulmonary nodule or pleural effusion.

Other: Normal visualized paraspinal cervical soft tissues.
IMPRESSION: 1. No acute intracranial abnormality.
2. Large left frontal scalp hematoma without skull fracture.
3. Minimally displaced fractures of the nasal bones.
4. No acute fracture of the cervical spine.
5. Brain parenchymal atrophy and chronic microvascular disease.
